# Patient Record
Sex: Male | Born: 1955 | Race: White | Hispanic: No | Marital: Married | Smoking: Never smoker
Health system: Southern US, Community
[De-identification: ages and names within clinical notes are randomized; demographics above are authoritative.]

## PROBLEM LIST (undated history)

## (undated) DIAGNOSIS — Z87828 Personal history of other (healed) physical injury and trauma: Secondary | ICD-10-CM

## (undated) DIAGNOSIS — E119 Type 2 diabetes mellitus without complications: Secondary | ICD-10-CM

## (undated) DIAGNOSIS — F329 Major depressive disorder, single episode, unspecified: Secondary | ICD-10-CM

## (undated) DIAGNOSIS — R519 Headache, unspecified: Secondary | ICD-10-CM

## (undated) DIAGNOSIS — K219 Gastro-esophageal reflux disease without esophagitis: Secondary | ICD-10-CM

## (undated) DIAGNOSIS — R351 Nocturia: Secondary | ICD-10-CM

## (undated) DIAGNOSIS — IMO0002 Reserved for concepts with insufficient information to code with codable children: Secondary | ICD-10-CM

## (undated) DIAGNOSIS — R51 Headache: Secondary | ICD-10-CM

## (undated) DIAGNOSIS — Z923 Personal history of irradiation: Secondary | ICD-10-CM

## (undated) DIAGNOSIS — C61 Malignant neoplasm of prostate: Secondary | ICD-10-CM

## (undated) DIAGNOSIS — Z973 Presence of spectacles and contact lenses: Secondary | ICD-10-CM

## (undated) DIAGNOSIS — F32A Depression, unspecified: Secondary | ICD-10-CM

## (undated) DIAGNOSIS — R350 Frequency of micturition: Secondary | ICD-10-CM

## (undated) HISTORY — PX: PROSTATE BIOPSY: SHX241

## (undated) HISTORY — PX: COLONOSCOPY: SHX174

---
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
## 2015-03-01 ENCOUNTER — Other Ambulatory Visit (HOSPITAL_COMMUNITY): Payer: Self-pay | Admitting: Urology

## 2015-03-01 DIAGNOSIS — C61 Malignant neoplasm of prostate: Secondary | ICD-10-CM

## 2015-03-15 ENCOUNTER — Encounter (HOSPITAL_COMMUNITY)
Admission: RE | Admit: 2015-03-15 | Discharge: 2015-03-15 | Disposition: A | Discharge status: Home / Self Care | Payer: Federal, State, Local not specified - PPO | Source: Ambulatory Visit | Attending: Urology | Admitting: Urology

## 2015-03-15 DIAGNOSIS — C61 Malignant neoplasm of prostate: Secondary | ICD-10-CM | POA: Diagnosis present

## 2015-03-15 IMAGING — NM NM BONE WHOLE BODY
2 series · 2 of 2 positions shown · non-contrast
Comparison: None.

CLINICAL DATA: Prostate carcinoma, evaluate for bone metastases

EXAM:
NUCLEAR MEDICINE WHOLE BODY BONE SCAN
TECHNIQUE: Whole body anterior and posterior images were obtained approximately
3 hours after intravenous injection of radiopharmaceutical.
RADIOPHARMACEUTICALS:  24.0 mCi [3J] MDP IV

[Series 1: whole body · 2.66mm/px · 1 of 1 slices shown (1 of 2)]
[im 1/1]
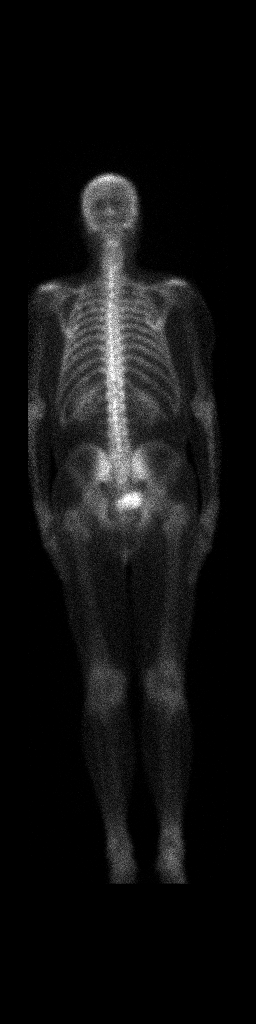

[Series 1: whole body · 2.66mm/px · 1 of 1 slices shown (2 of 2)]
[im 1/1]
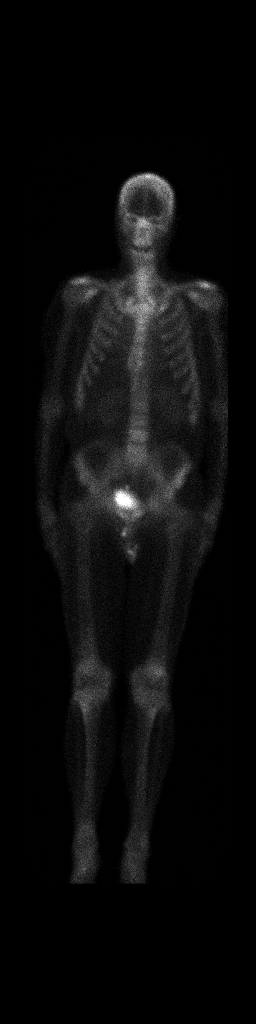

[2 of 2 positions shown; findings below may reference images not displayed]

FINDINGS: The patient was injected with 24.0 millicuries of technetium 99 M
MDP intravenously, and total body bone scan was performed. Activity
throughout the skeleton appears normal. No evidence of bone
metastasis is seen.
IMPRESSION: No evidence of bone metastasis.

## 2015-03-15 MED ORDER — TECHNETIUM TC 99M MEDRONATE IV KIT
24.0000 | PACK | Freq: Once | INTRAVENOUS | Status: AC | PRN
  Administered 2015-03-15: 24 via INTRAVENOUS

## 2015-03-22 ENCOUNTER — Encounter: Payer: Self-pay | Admitting: Radiation Oncology

## 2015-03-22 NOTE — Progress Notes (Signed)
GU Location of Tumor / Histology: prostatic adenocarcinoma  If Prostate Cancer, Gleason Score is (4 + 4) and PSA is (14.24)  Charles Marks has a history of an elevated PSA of 6.2 in December of 2015. Initial prostate biopsy proved BPH only.  Second prostate biopsies revealed:    Past/Anticipated interventions by urology, if any: biopsy, CT and bone scan clear, Lupron administered on 03/20/2015, referred to Dr. [name] to discuss seed and short course of external beam  Past/Anticipated interventions by medical oncology, if any: no  Weight changes, if any: no  Bowel/Bladder complaints, if any: nocturia x 1 maybe 2-3 nights out of 7. Describes a strong steady urine stream without difficulty emptying his bladder. Denies urinary hesitancy. Denies stress incontinence or leakage. Denies dysuria or hematuria.  Nausea/Vomiting, if any: no  Pain issues, if any:  no  SAFETY ISSUES:  Prior radiation? no  Pacemaker/ICD? no  Possible current pregnancy? no  Is the patient on methotrexate? no  Current Complaints / other details:  60 year old male. NKDA. 26 cc prostate volume.

## 2015-03-23 ENCOUNTER — Ambulatory Visit
Admission: RE | Admit: 2015-03-23 | Discharge: 2015-03-23 | Disposition: A | Discharge status: Home / Self Care | Payer: Federal, State, Local not specified - PPO | Source: Ambulatory Visit | Attending: Radiation Oncology | Admitting: Radiation Oncology

## 2015-03-23 ENCOUNTER — Encounter: Payer: Self-pay | Admitting: Radiation Oncology

## 2015-03-23 VITALS — BP 118/41 | HR 58 | Temp 98.0°F | Resp 16 | Ht 75.0 in | Wt 189.8 lb

## 2015-03-23 DIAGNOSIS — Z510 Encounter for antineoplastic radiation therapy: Secondary | ICD-10-CM | POA: Insufficient documentation

## 2015-03-23 DIAGNOSIS — F329 Major depressive disorder, single episode, unspecified: Secondary | ICD-10-CM | POA: Diagnosis not present

## 2015-03-23 DIAGNOSIS — E119 Type 2 diabetes mellitus without complications: Secondary | ICD-10-CM | POA: Insufficient documentation

## 2015-03-23 DIAGNOSIS — C61 Malignant neoplasm of prostate: Secondary | ICD-10-CM

## 2015-03-23 DIAGNOSIS — Z809 Family history of malignant neoplasm, unspecified: Secondary | ICD-10-CM | POA: Diagnosis not present

## 2015-03-23 DIAGNOSIS — K219 Gastro-esophageal reflux disease without esophagitis: Secondary | ICD-10-CM | POA: Insufficient documentation

## 2015-03-23 HISTORY — DX: Malignant neoplasm of prostate: C61

## 2015-03-23 HISTORY — DX: Gastro-esophageal reflux disease without esophagitis: K21.9

## 2015-03-23 HISTORY — DX: Depression, unspecified: F32.A

## 2015-03-23 HISTORY — DX: Major depressive disorder, single episode, unspecified: F32.9

## 2015-03-23 NOTE — Progress Notes (Signed)
Radiation Oncology         [phone]) [phone] ________________________________  Initial Outpatient Consultation  Name: Charles Marks MRN: [id number]  Date: 03/23/2015  DOB: 1955/02/04  [id number], Jeneen Rinks, MD  Kathie Rhodes, MD   REFERRING PHYSICIAN: Kathie Rhodes, MD  DIAGNOSIS: 60 y.o. gentleman with stage T1c adenocarcinoma of the prostate with a Gleason's score of 4+4 and a PSA of 14.24.    ICD-9-CM ICD-10-CM   1. Malignant neoplasm of prostate ([place]) [place] ILLNESS:Charles Marks is a 60 y.o. gentleman with a new diagnosis of adenocarcinoma of the prostate.  He was noted to have an elevated PSA of 6.2 in December 2015 while living in [place]. Initial prostate biopsy at that time did not reveal any malignancy.  He moved to [place] and was noted to have an elevated PSA of 14.24 by his primary care physician, Dr. Kary Kos.  Accordingly, he was referred for evaluation in urology by Dr. Karsten Ro on 02/09/15,  digital rectal examination was normal at that time.  The patient proceeded to transrectal ultrasound with 12 biopsies of the prostate on 02/21/2015.  The prostate volume measured 25.6 cc. 3/12 core biopsies, positive with a maximum Gleason score of 4+4 and this was seen in the left base. Gleason score 4+3 was seen in the left mid lateral and left apex lateral.  The patient reviewed the biopsy results with his urologist and he has kindly been referred today for discussion with Dr. [name]. He was counseled on the role of ADT and received Lupron on 03/20/15. He was already counseled on the role of radiotherapy in addition to ADT for his high risk prostate cancer, and is interested in how seed implant may be a part of his care.     PREVIOUS RADIATION THERAPY: No  PAST MEDICAL HISTORY:  Past Medical History  Diagnosis Date  . Prostate cancer ([place])   . Depression   . Diabetes mellitus without complication ([place])   . GERD (gastroesophageal reflux disease)     PAST  SURGICAL HISTORY: Past Surgical History  Procedure Laterality Date  . Appendectomy    . Prostate biopsy    . Prostate biopsy    . Vasectomy      FAMILY HISTORY: family history includes Cancer in his mother and sister; Diabetes in his sister; Multiple sclerosis in his mother. No history of prostate cancer.  SOCIAL HISTORY:  reports that he has never smoked. He has never used smokeless tobacco. He reports that he does not drink alcohol or use illicit drugs. 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 He is married and resides in [place]. He and his wife do not have any children. They are looking forward to receiving their new golden retriever puppy soon.  ALLERGIES: Review of patient's allergies indicates no known allergies.  MEDICATIONS:  Current Outpatient Prescriptions  Medication Sig Dispense Refill  . Cholecalciferol (VITAMIN D3) 1000 units CAPS Take by mouth.    . Cyanocobalamin (VITAMIN B-12) 5000 MCG SUBL Place under the tongue.    . ferrous sulfate 325 (65 FE) MG tablet Take by mouth.    . Multiple Vitamin (MULTI-VITAMINS) TABS Take by mouth.    Marland Kitchen omeprazole (PRILOSEC) 40 MG capsule Take by mouth.    . QUEtiapine Fumarate (SEROQUEL XR) 150 MG 24 hr tablet Take by mouth.    . sertraline (ZOLOFT) 100 MG tablet Take by mouth.    . SUMAtriptan 6 MG/0.5ML SOAJ Inject into the  skin.    . vitamin C (ASCORBIC ACID) 500 MG tablet Take by mouth.     No current facility-administered medications for this encounter.    REVIEW OF SYSTEMS: On review of systems, the patient reports that he is doing extremely well. He states that he is not experiencing any difficulty with chest pain, shortness of breath, fevers or chills. He is not experiencing any nausea or vomiting. He denies any abdominal pain, fullness, early satiety. He has not noticed any difficulty with bowel dysfunction. He has not had any unintended weight changes. The patient completed an IPSS and  IIEF questionnaire.  His IPSS score was 2 indicating mild urinary outflow obstructive symptoms.  He indicated that his erectile function is able to complete sexual activity on every attempt. A complete review of systems is obtained and is otherwise negative.   PHYSICAL EXAM:   height is 6\' 3"  (1.905 m) and weight is 189 lb 12.8 oz (86.093 kg). His oral temperature is 98 F (36.7 C). His blood pressure is 118/41 and his pulse is 58. His respiration is 16 and oxygen saturation is 100%.   Pain scale 0/10 In general this is a well appearing caucasian male in no acute distress. He is alert and oriented x4 and appropriate throughout the examination. HEENT reveals that the patient is normocephalic, atraumatic. EOMs are intact. PERRLA. Skin is intact without any evidence of gross lesions. Cardiovascular exam reveals a regular rate and rhythm, no clicks rubs or murmurs are auscultated. Chest is clear to auscultation bilaterally. Lymphatic assessment is performed and does not reveal any adenopathy in the cervical, supraclavicular, axillary, or inguinal chains. Abdomen has active bowel sounds in all quadrants and is intact. The abdomen is soft, non tender, non distended. Lower extremities are negative for pretibial pitting edema, deep calf tenderness, cyanosis or clubbing.   KPS = 100  100 - Normal; no complaints; no evidence of disease. 90   - Able to carry on normal activity; minor signs or symptoms of disease. 80   - Normal activity with effort; some signs or symptoms of disease. 92   - Cares for self; unable to carry on normal activity or to do active work. 60   - Requires occasional assistance, but is able to care for most of his personal needs. 50   - Requires considerable assistance and frequent medical care. 60   - Disabled; requires special care and assistance. 17   - Severely disabled; hospital admission is indicated although death not imminent. 42   - Very sick; hospital admission necessary; active  supportive treatment necessary. 10   - Moribund; fatal processes progressing rapidly. 0     - Dead  Karnofsky DA, Abelmann WH, Craver LS and Burchenal JH [phone]) The use of the nitrogen mustards in the palliative treatment of carcinoma: with particular reference to bronchogenic carcinoma Cancer 1 634-56   LABORATORY DATA:  No results found for: WBC, HGB, HCT, MCV, PLT No results found for: NA, K, CL, CO2 No results found for: ALT, AST, GGT, ALKPHOS, BILITOT   RADIOGRAPHY: Nm Bone Scan Whole Body  03/15/2015  CLINICAL DATA:  Prostate carcinoma, evaluate for bone metastases EXAM: NUCLEAR MEDICINE WHOLE BODY BONE SCAN TECHNIQUE: Whole body anterior and posterior images were obtained approximately 3 hours after intravenous injection of radiopharmaceutical. RADIOPHARMACEUTICALS:  24.0 mCi Technetium-43m MDP IV COMPARISON:  None. FINDINGS: The patient was injected with 0000000 millicuries of technetium 60 M MDP intravenously, and total body bone scan was performed. Activity throughout  the skeleton appears normal. No evidence of bone metastasis is seen. IMPRESSION: No evidence of bone metastasis. Electronically Signed   By: Ivar Drape M.D.   On: 03/15/2015 14:11      IMPRESSION: This gentleman is a 60 y.o. gentleman with stage T1c adenocarcinoma of the prostate with a Gleason's score of 4+4 and a PSA of 14.24.Marland Kitchen  His T-Stage, Gleason's Score, and PSA put him into the high risk group.  Accordingly he is eligible for a variety of potential treatment options including prostatectomy vs. radiation therapy with hormone therapy +/- seed implant boost.  PLAN: Dr. [name] reviewed the findings and workup thus far.  We discussed the natural history of prostate cancer.  We reviewed the the implications of T-stage, Gleason's Score, and PSA on decision-making and outcomes in prostate cancer.  We discussed radiation treatment in the management of prostate cancer with regard to the logistics and delivery of external beam  radiation treatment as well as the logistics and delivery of prostate brachytherapy.  We compared and contrasted each of these approaches and also compared these against prostatectomy.  The patient expressed interest in external beam radiotherapy plus seed implant. Dr. [name] filled out a patient counseling form for him with relevant treatment diagrams and we retained a copy for our records.   The patient would like to proceed with two years of hormone therapy plus external radiation with seed implant.  We will share these findings with Dr. Karsten Ro and move forward with scheduling placement of three gold fiducial markers into the prostate to proceed with external radiation in the near future.     We enjoyed meeting with him today, and will look forward to participating in the care of this very nice gentleman.  The above documentation reflects my direct findings during this shared patient visit. Please see the separate note by Dr. [name] on this date for the remainder of the patient's plan of care.  Carola Rhine, PAC   This document serves as a record of services personally performed by [name], PAC and [name], MD. It was created on their behalf by Darcus Austin, a trained medical scribe. The creation of this record is based on the scribe's personal observations and the provider's statements to them. This document has been checked and approved by the attending provider.

## 2015-03-23 NOTE — Progress Notes (Signed)
See progress note under physician encounter. 

## 2015-04-06 ENCOUNTER — Telehealth: Payer: Self-pay | Admitting: *Deleted

## 2015-04-06 NOTE — Telephone Encounter (Signed)
CALLED PATIENT TO INFORM OF SIM APPT. ON 05-12-15 @ 10 AM, PATIENT TO HAVE GOLD SEEDS PLACED ON 04-11-15 @ DR. OTTELIN'S , SPOKE WITH PATIENT'S WIFE - JANET AND SHE IS AWARE OF THESE APPTS.

## 2015-05-01 ENCOUNTER — Other Ambulatory Visit: Payer: Self-pay | Admitting: Radiation Oncology

## 2015-05-01 DIAGNOSIS — C61 Malignant neoplasm of prostate: Secondary | ICD-10-CM

## 2015-05-01 MED ORDER — DIAZEPAM 5 MG PO TABS: 5.0000 mg | ORAL_TABLET | ORAL | Status: DC

## 2015-05-02 ENCOUNTER — Telehealth: Payer: Self-pay | Admitting: Radiation Oncology

## 2015-05-02 NOTE — Telephone Encounter (Signed)
Phoned patient to inquire about pharmacy of choice. Learned [facility] in [place], [place]. Enter this pharmacy into his preference list. Per Dr. Johny Shears order called in Valium to [facility] in [place]. Patient understands he can pick up this script in approximately one hour. Patient expresses appreciation for the call.

## 2015-05-12 ENCOUNTER — Ambulatory Visit
Admission: RE | Admit: 2015-05-12 | Discharge: 2015-05-12 | Disposition: A | Discharge status: Home / Self Care | Payer: Federal, State, Local not specified - PPO | Source: Ambulatory Visit | Attending: Radiation Oncology | Admitting: Radiation Oncology

## 2015-05-12 ENCOUNTER — Other Ambulatory Visit: Payer: Self-pay | Admitting: Radiation Oncology

## 2015-05-12 DIAGNOSIS — Z510 Encounter for antineoplastic radiation therapy: Secondary | ICD-10-CM | POA: Diagnosis not present

## 2015-05-12 DIAGNOSIS — C61 Malignant neoplasm of prostate: Secondary | ICD-10-CM

## 2015-05-12 NOTE — Progress Notes (Signed)
  Radiation Oncology         [phone]) [phone] ________________________________  Name: Charles Marks MRN: [id number]  Date: 05/12/2015  DOB: 1955-05-20  SIMULATION AND TREATMENT PLANNING NOTE    ICD-9-CM ICD-10-CM   1. Malignant neoplasm of prostate ([place]) 185 C61     DIAGNOSIS:  60 y.o. gentleman with stage T1c adenocarcinoma of the prostate with a Gleason's score of 4+4 and a PSA of 14.24.  NARRATIVE:  The patient was brought to the [facility].  Identity was confirmed.  All relevant records and images related to the planned course of therapy were reviewed.  The patient freely provided informed written consent to proceed with treatment after reviewing the details related to the planned course of therapy. The consent form was witnessed and verified by the simulation staff.  Then, the patient was set-up in a stable reproducible supine position for radiation therapy.  A vacuum lock pillow device was custom fabricated to position his legs in a reproducible immobilized position.  Then, I performed a urethrogram under sterile conditions to identify the prostatic apex.  CT images were obtained.  Surface markings were placed.  The CT images were loaded into the planning software.  Then the prostate target and avoidance structures including the rectum, bladder, bowel and hips were contoured.  Treatment planning then occurred.  The radiation prescription was entered and confirmed.  A total of 5 complex treatment devices were fabricated including one BodyFix immobilizer and 4 MLC collimators to shield critical bladder, bowel, and hips. I have requested : 3D Simulation  I have requested a DVH of the following structures: Prostate, Rectum, Left Hip, Right Hip, Bowel.  COMPLEX SIMULATION:  The patient presented today also for evaluation for possible prostate seed implant boost. On his planning CT on each axial slice, I contoured the prostate gland. Then, using three-dimensional radiation planning  tools I reconstructed the prostate in view of the structures from the transperineal needle pathway to assess for possible pubic arch interference. In doing so, I did not appreciate any pubic arch interference. Also, the patient's prostate volume was estimated based on the drawn structure. The volume was 23 cc.  Given the pubic arch appearance and prostate volume, patient remains a good candidate to proceed with prostate seed implant as a boost    PLAN:  The patient will receive 45 Gy in 25 fractions followed by seed boost  ________________________________  [name]. [name], M.D.  This document serves as a record of services personally performed by [name], MD. It was created on his behalf by Arlyce Harman, a trained medical scribe. The creation of this record is based on the scribe's personal observations and the provider's statements to them. This document has been checked and approved by the attending provider.

## 2015-05-18 DIAGNOSIS — Z510 Encounter for antineoplastic radiation therapy: Secondary | ICD-10-CM | POA: Diagnosis not present

## 2015-05-23 ENCOUNTER — Ambulatory Visit
Admission: RE | Admit: 2015-05-23 | Discharge: 2015-05-23 | Disposition: A | Discharge status: Home / Self Care | Payer: Federal, State, Local not specified - PPO | Source: Ambulatory Visit | Attending: Radiation Oncology | Admitting: Radiation Oncology

## 2015-05-23 DIAGNOSIS — Z510 Encounter for antineoplastic radiation therapy: Secondary | ICD-10-CM | POA: Diagnosis not present

## 2015-05-24 ENCOUNTER — Encounter: Payer: Self-pay | Admitting: Radiation Oncology

## 2015-05-24 ENCOUNTER — Ambulatory Visit
Admission: RE | Admit: 2015-05-24 | Discharge: 2015-05-24 | Disposition: A | Discharge status: Home / Self Care | Payer: Federal, State, Local not specified - PPO | Source: Ambulatory Visit | Attending: Radiation Oncology | Admitting: Radiation Oncology

## 2015-05-24 DIAGNOSIS — Z510 Encounter for antineoplastic radiation therapy: Secondary | ICD-10-CM | POA: Diagnosis not present

## 2015-05-24 NOTE — Progress Notes (Signed)
Received voicemail from [employer](Darlene)@ [phone] for patient asking about copays being collected and no one contacted them to obtain benefit information. She left contact name and number for patient's spouse to return call to or to contact them at [facility]. Reviewed chart and saw this is a Radiation patient. Called patient's wife to advise her that Radiation bills separate and she can contact Meredith([phone])whom is a financial advocate for Radiation patients and can discuss how their copays, insurance, and billing is handled. Patient thanked me for returning her call.

## 2015-05-25 ENCOUNTER — Ambulatory Visit
Admission: RE | Admit: 2015-05-25 | Payer: Federal, State, Local not specified - PPO | Source: Ambulatory Visit | Admitting: Radiation Oncology

## 2015-05-25 ENCOUNTER — Ambulatory Visit
Admission: RE | Admit: 2015-05-25 | Discharge: 2015-05-25 | Disposition: A | Discharge status: Home / Self Care | Payer: Federal, State, Local not specified - PPO | Source: Ambulatory Visit | Attending: Radiation Oncology | Admitting: Radiation Oncology

## 2015-05-25 DIAGNOSIS — Z510 Encounter for antineoplastic radiation therapy: Secondary | ICD-10-CM | POA: Diagnosis not present

## 2015-05-26 ENCOUNTER — Ambulatory Visit
Admission: RE | Admit: 2015-05-26 | Discharge: 2015-05-26 | Disposition: A | Discharge status: Home / Self Care | Payer: Federal, State, Local not specified - PPO | Source: Ambulatory Visit | Attending: Radiation Oncology | Admitting: Radiation Oncology

## 2015-05-26 ENCOUNTER — Encounter: Payer: Self-pay | Admitting: Radiation Oncology

## 2015-05-26 VITALS — BP 116/57 | HR 59 | Temp 97.7°F | Resp 12 | Wt 188.6 lb

## 2015-05-26 DIAGNOSIS — Z510 Encounter for antineoplastic radiation therapy: Secondary | ICD-10-CM | POA: Diagnosis not present

## 2015-05-26 DIAGNOSIS — C61 Malignant neoplasm of prostate: Secondary | ICD-10-CM

## 2015-05-26 MED ORDER — RADIAPLEXRX EX GEL
Freq: Once | CUTANEOUS | Status: AC
  Administered 2015-05-26: 12:00:00 via TOPICAL

## 2015-05-26 NOTE — Progress Notes (Signed)
PAIN: He is currently in no pain. URINARY: Pt denies abnormal urinary symptoms, clear yellow urine. Pt states they urinate 1 - 2 times per night.  BOWEL: Pt reports "feeling off with his stomach." a soft bowel movement everyday/everyother day. OTHER: Pt complains of fatigue and weakness.   BP 116/57 mmHg  Pulse 59  Temp(Src) 97.7 F (36.5 C) (Oral)  Resp 12  Wt 188 lb 9.6 oz (85.548 kg)  SpO2 100% Wt Readings from Last 3 Encounters:  05/26/15 188 lb 9.6 oz (85.548 kg)  03/23/15 189 lb 12.8 oz (86.093 kg)

## 2015-05-29 ENCOUNTER — Ambulatory Visit
Admission: RE | Admit: 2015-05-29 | Discharge: 2015-05-29 | Disposition: A | Discharge status: Home / Self Care | Payer: Federal, State, Local not specified - PPO | Source: Ambulatory Visit | Attending: Radiation Oncology | Admitting: Radiation Oncology

## 2015-05-29 DIAGNOSIS — Z510 Encounter for antineoplastic radiation therapy: Secondary | ICD-10-CM | POA: Diagnosis not present

## 2015-05-29 NOTE — Addendum Note (Signed)
Encounter addended by: Kyung Rudd, MD on: 05/29/2015  6:21 AM<BR>     Documentation filed: Notes Section, Problem List

## 2015-05-29 NOTE — Progress Notes (Signed)
   Department of Radiation Oncology  Phone:  [phone] Fax:        [phone]  Weekly Treatment Note    Name: Charles Marks Date: 05/29/2015 MRN: [id number] DOB: October 07, 1955   Diagnosis:     ICD-9-CM ICD-10-CM   1. Malignant neoplasm of prostate ([facility]) 185 C61 hyaluronate sodium (RADIAPLEXRX) gel     Current dose: 7.2 Gy  Current fraction: 4   MEDICATIONS: Current Outpatient Prescriptions  Medication Sig Dispense Refill  . Cholecalciferol (VITAMIN D3) 1000 units CAPS Take by mouth.    . Cyanocobalamin (VITAMIN B-12) 5000 MCG SUBL Place under the tongue.    . diazepam (VALIUM) 5 MG tablet Take 1 tablet (5 mg total) by mouth as directed. 30 min before radiation appointment 10 tablet 0  . ferrous sulfate 325 (65 FE) MG tablet Take by mouth.    . Multiple Vitamin (MULTI-VITAMINS) TABS Take by mouth.    Marland Kitchen omeprazole (PRILOSEC) 40 MG capsule Take by mouth.    . QUEtiapine Fumarate (SEROQUEL XR) 150 MG 24 hr tablet Take by mouth.    . sertraline (ZOLOFT) 100 MG tablet Take by mouth.    . SUMAtriptan 6 MG/0.5ML SOAJ Inject into the skin.    Marland Kitchen vitamin C (ASCORBIC ACID) 500 MG tablet Take by mouth.     No current facility-administered medications for this encounter.     ALLERGIES: Review of patient's allergies indicates no known allergies.   LABORATORY DATA:  No results found for: WBC, HGB, HCT, MCV, PLT No results found for: NA, K, CL, CO2 No results found for: ALT, AST, GGT, ALKPHOS, BILITOT   NARRATIVE: Charles Marks was seen today for weekly treatment management. The chart was checked and the patient's films were reviewed.  PAIN: He is currently in no pain. URINARY: Pt denies abnormal urinary symptoms, clear yellow urine. Pt states they urinate 1 - 2 times per night.  BOWEL: Pt reports "feeling off with his stomach." a soft bowel movement everyday/everyother day. OTHER: Pt complains of fatigue and weakness.   BP 116/57 mmHg  Pulse 59  Temp(Src) 97.7 F (36.5  C) (Oral)  Resp 12  Wt 188 lb 9.6 oz (85.548 kg)  SpO2 100% Wt Readings from Last 3 Encounters:  05/26/15 188 lb 9.6 oz (85.548 kg)  03/23/15 189 lb 12.8 oz (86.093 kg)    PHYSICAL EXAMINATION: weight is 188 lb 9.6 oz (85.548 kg). His oral temperature is 97.7 F (36.5 C). His blood pressure is 116/57 and his pulse is 59. His respiration is 12 and oxygen saturation is 100%.        ASSESSMENT: The patient is doing satisfactorily with treatment.  PLAN: We will continue with the patient's radiation treatment as planned.

## 2015-05-30 ENCOUNTER — Ambulatory Visit
Admission: RE | Admit: 2015-05-30 | Discharge: 2015-05-30 | Disposition: A | Discharge status: Home / Self Care | Payer: Federal, State, Local not specified - PPO | Source: Ambulatory Visit | Attending: Radiation Oncology | Admitting: Radiation Oncology

## 2015-05-30 ENCOUNTER — Other Ambulatory Visit: Payer: Self-pay | Admitting: Urology

## 2015-05-30 DIAGNOSIS — Z510 Encounter for antineoplastic radiation therapy: Secondary | ICD-10-CM | POA: Diagnosis not present

## 2015-05-31 ENCOUNTER — Ambulatory Visit
Admission: RE | Admit: 2015-05-31 | Discharge: 2015-05-31 | Disposition: A | Discharge status: Home / Self Care | Payer: Federal, State, Local not specified - PPO | Source: Ambulatory Visit | Attending: Radiation Oncology | Admitting: Radiation Oncology

## 2015-05-31 DIAGNOSIS — Z510 Encounter for antineoplastic radiation therapy: Secondary | ICD-10-CM | POA: Diagnosis not present

## 2015-06-01 ENCOUNTER — Encounter: Payer: Self-pay | Admitting: Radiation Oncology

## 2015-06-01 ENCOUNTER — Ambulatory Visit
Admission: RE | Admit: 2015-06-01 | Discharge: 2015-06-01 | Disposition: A | Discharge status: Home / Self Care | Payer: Federal, State, Local not specified - PPO | Source: Ambulatory Visit | Attending: Radiation Oncology | Admitting: Radiation Oncology

## 2015-06-01 ENCOUNTER — Other Ambulatory Visit: Payer: Self-pay

## 2015-06-01 ENCOUNTER — Ambulatory Visit (HOSPITAL_COMMUNITY)
Admission: RE | Admit: 2015-06-01 | Discharge: 2015-06-01 | Disposition: A | Discharge status: Home / Self Care | Payer: Federal, State, Local not specified - PPO | Source: Ambulatory Visit | Attending: Radiation Oncology | Admitting: Radiation Oncology

## 2015-06-01 ENCOUNTER — Ambulatory Visit (HOSPITAL_COMMUNITY): Payer: Federal, State, Local not specified - PPO

## 2015-06-01 ENCOUNTER — Ambulatory Visit (HOSPITAL_COMMUNITY)
Admission: RE | Admit: 2015-06-01 | Discharge: 2015-06-01 | Disposition: A | Discharge status: Home / Self Care | Payer: Federal, State, Local not specified - PPO | Source: Ambulatory Visit | Attending: Urology | Admitting: Urology

## 2015-06-01 ENCOUNTER — Other Ambulatory Visit: Payer: Self-pay | Admitting: Urology

## 2015-06-01 ENCOUNTER — Ambulatory Visit (HOSPITAL_COMMUNITY): Admission: RE | Admit: 2015-06-01 | Payer: Federal, State, Local not specified - PPO | Source: Ambulatory Visit

## 2015-06-01 VITALS — BP 127/53 | HR 66 | Resp 16 | Wt 190.5 lb

## 2015-06-01 DIAGNOSIS — Z01818 Encounter for other preprocedural examination: Secondary | ICD-10-CM | POA: Diagnosis present

## 2015-06-01 DIAGNOSIS — C61 Malignant neoplasm of prostate: Secondary | ICD-10-CM

## 2015-06-01 DIAGNOSIS — Z510 Encounter for antineoplastic radiation therapy: Secondary | ICD-10-CM | POA: Diagnosis not present

## 2015-06-01 IMAGING — CR DG CHEST 2V
2 series · 2 of 2 positions shown · non-contrast
Comparison: None.

CLINICAL DATA: Preop for brachytherapy seed implant

EXAM:
CHEST  2 VIEW

[w chest pa]
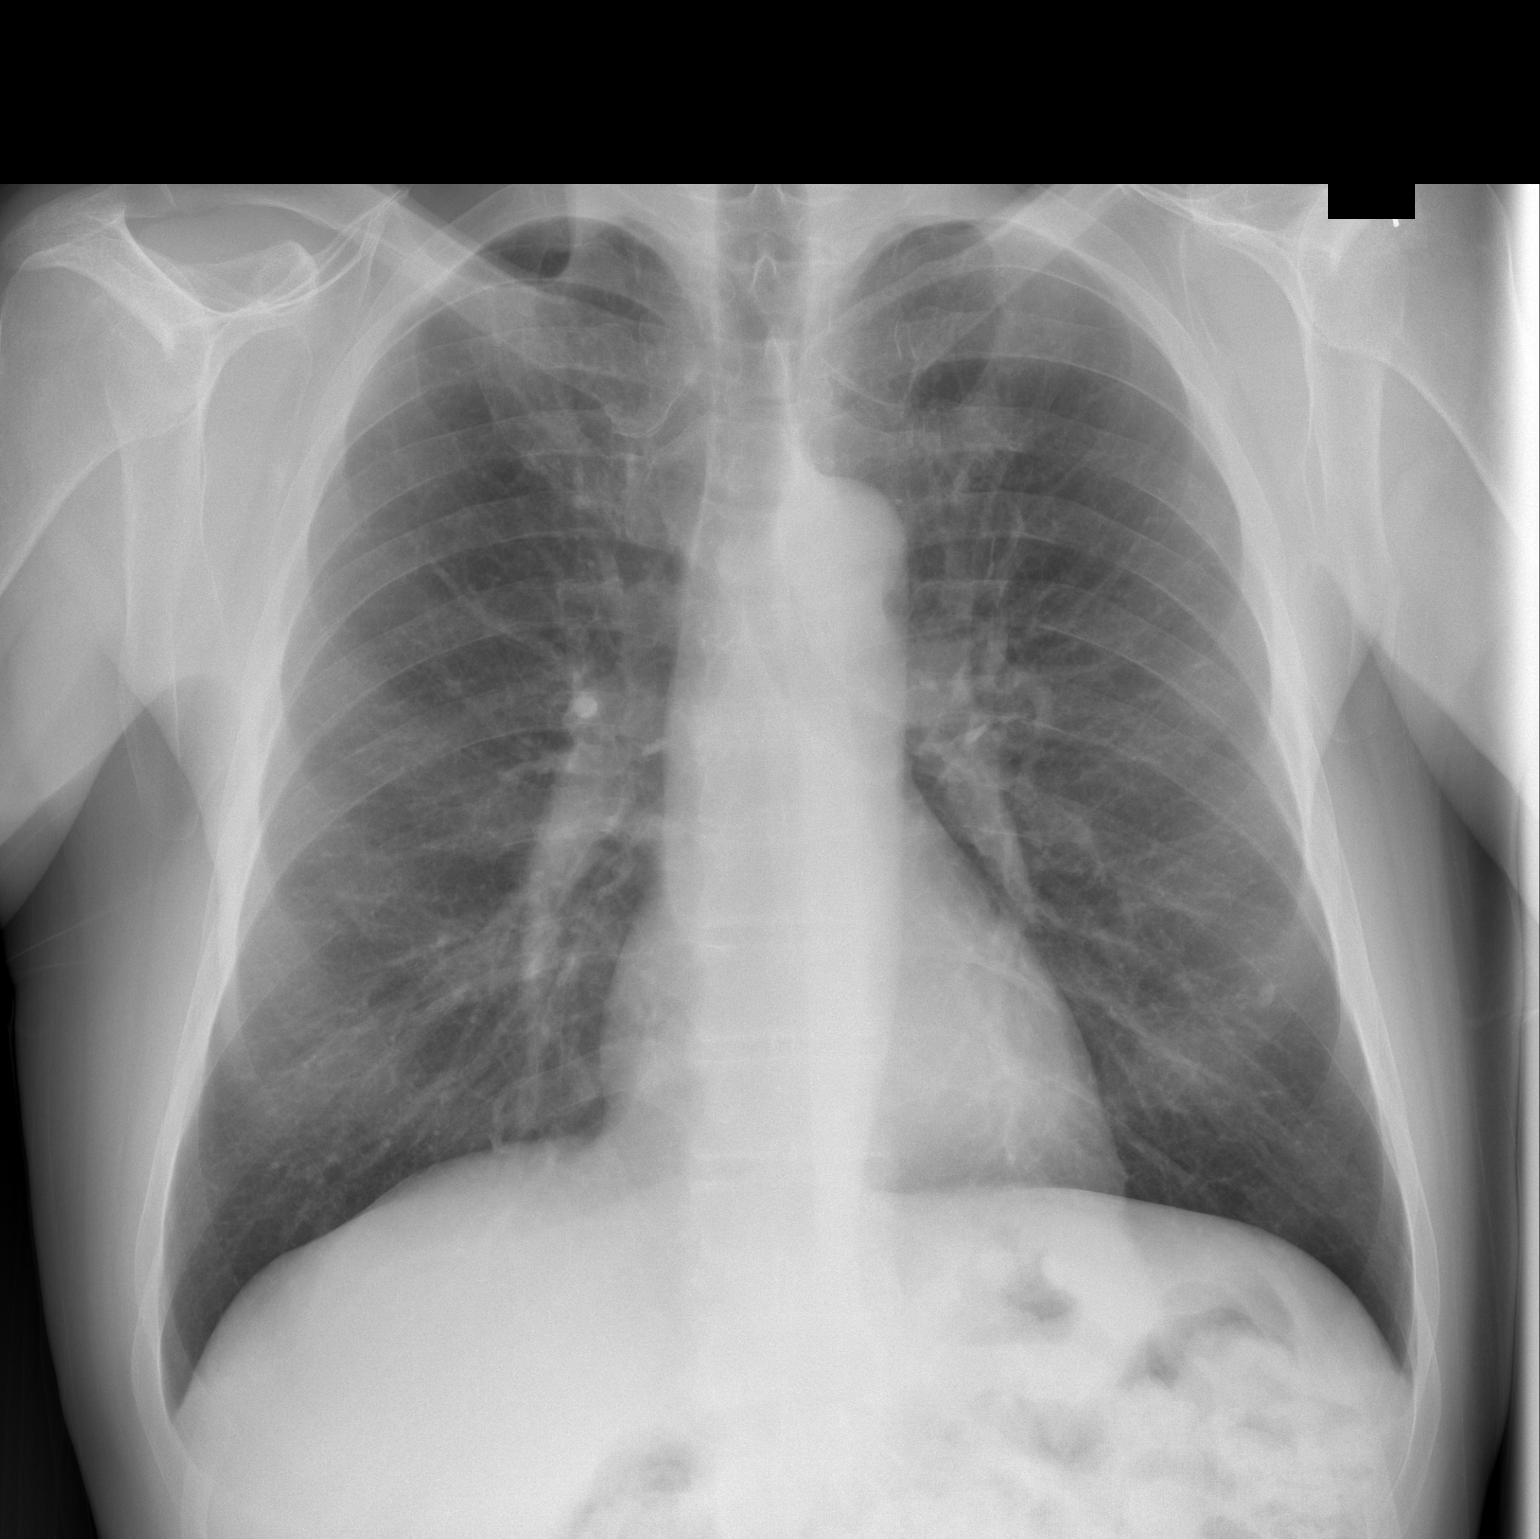

[w chest lat]
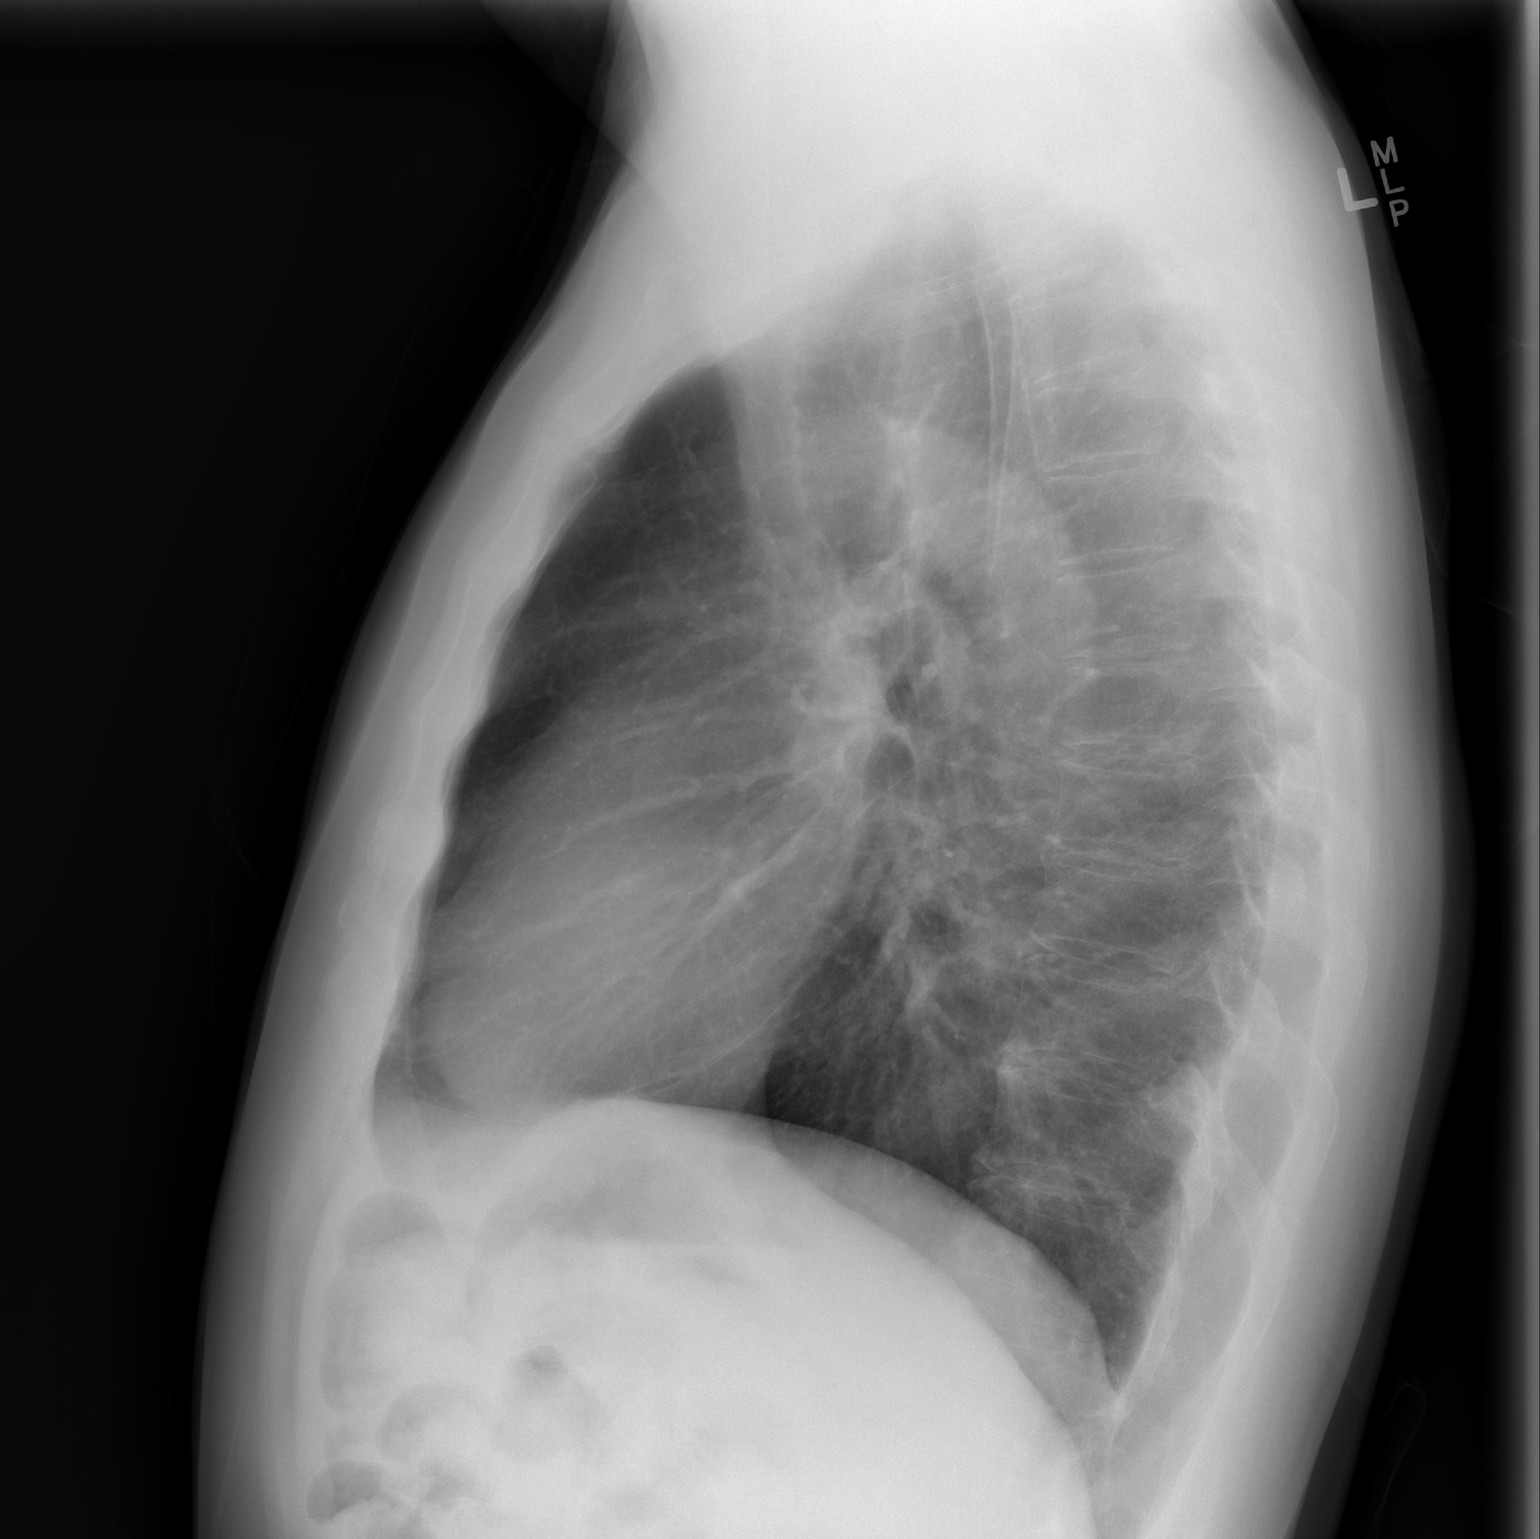

[2 of 2 positions shown; findings below may reference images not displayed]

FINDINGS: The heart size and mediastinal contours are within normal limits.
Both lungs are clear. The visualized skeletal structures are
unremarkable.
IMPRESSION: No active cardiopulmonary disease.

## 2015-06-01 NOTE — Progress Notes (Signed)
Weight and vitals stable. Denies pain. Reports nocturia x 2. Denies dysuria or hematuria. Denies difficulty emptying . Describes a strong steady urine stream. Denies incontinence or leakage. Describes soft stool. Denies rectal irritation. Reports mild fatigue.   BP 127/53 mmHg  Pulse 66  Resp 16  Wt 190 lb 8 oz (86.41 kg)  SpO2 100% Wt Readings from Last 3 Encounters:  06/01/15 190 lb 8 oz (86.41 kg)  05/26/15 188 lb 9.6 oz (85.548 kg)  03/23/15 189 lb 12.8 oz (86.093 kg)

## 2015-06-01 NOTE — Progress Notes (Signed)
  Radiation Oncology         [phone]   Name: Charles Marks MRN: [id number]   Date: 06/01/2015  DOB: May 03, 1955   Weekly Radiation Therapy Management    ICD-9-CM ICD-10-CM   1. Malignant neoplasm of prostate ([place]) 185 C61     Current Dose: 14.4 Gy  Planned Dose:  45 Gy + Boost  Narrative The patient presents for routine under treatment assessment.  Weight and vitals stable. Denies pain. Reports nocturia x 2. Denies any dysuria, hematuria, incontinence, leakage, and difficulty emptying. He also denies any rectal irritation. He describes a strong steady urine stream as well as soft stool. Reports mild fatigue.   Set-up films were reviewed. The chart was checked.  Physical Findings  weight is 190 lb 8 oz (86.41 kg). His blood pressure is 127/53 and his pulse is 66. His respiration is 16 and oxygen saturation is 100%.  Weight essentially stable. This is a pleasant male in no acute distress. He is alert and oriented.   Impression The patient is tolerating radiation.  Plan Continue treatment as planned.    [name] [name], M.D.  This document serves as a record of services personally performed by [name], PA and [name], MD. It was created on their behalf by [name], a trained medical scribe. The creation of this record is based on the scribe's personal observations and the provider's statements to them. This document has been checked and approved by the attending provider.

## 2015-06-02 ENCOUNTER — Ambulatory Visit
Admission: RE | Admit: 2015-06-02 | Discharge: 2015-06-02 | Disposition: A | Discharge status: Home / Self Care | Payer: Federal, State, Local not specified - PPO | Source: Ambulatory Visit | Attending: Radiation Oncology | Admitting: Radiation Oncology

## 2015-06-02 DIAGNOSIS — Z510 Encounter for antineoplastic radiation therapy: Secondary | ICD-10-CM | POA: Diagnosis not present

## 2015-06-05 ENCOUNTER — Ambulatory Visit
Admission: RE | Admit: 2015-06-05 | Discharge: 2015-06-05 | Disposition: A | Discharge status: Home / Self Care | Payer: Federal, State, Local not specified - PPO | Source: Ambulatory Visit | Attending: Radiation Oncology | Admitting: Radiation Oncology

## 2015-06-05 DIAGNOSIS — Z510 Encounter for antineoplastic radiation therapy: Secondary | ICD-10-CM | POA: Diagnosis not present

## 2015-06-06 ENCOUNTER — Ambulatory Visit
Admission: RE | Admit: 2015-06-06 | Discharge: 2015-06-06 | Disposition: A | Discharge status: Home / Self Care | Payer: Federal, State, Local not specified - PPO | Source: Ambulatory Visit | Attending: Radiation Oncology | Admitting: Radiation Oncology

## 2015-06-06 DIAGNOSIS — Z510 Encounter for antineoplastic radiation therapy: Secondary | ICD-10-CM | POA: Diagnosis not present

## 2015-06-07 ENCOUNTER — Ambulatory Visit
Admission: RE | Admit: 2015-06-07 | Discharge: 2015-06-07 | Disposition: A | Discharge status: Home / Self Care | Payer: Federal, State, Local not specified - PPO | Source: Ambulatory Visit | Attending: Radiation Oncology | Admitting: Radiation Oncology

## 2015-06-07 DIAGNOSIS — Z510 Encounter for antineoplastic radiation therapy: Secondary | ICD-10-CM | POA: Diagnosis not present

## 2015-06-08 ENCOUNTER — Ambulatory Visit
Admission: RE | Admit: 2015-06-08 | Discharge: 2015-06-08 | Disposition: A | Discharge status: Home / Self Care | Payer: Federal, State, Local not specified - PPO | Source: Ambulatory Visit | Attending: Radiation Oncology | Admitting: Radiation Oncology

## 2015-06-08 DIAGNOSIS — Z510 Encounter for antineoplastic radiation therapy: Secondary | ICD-10-CM | POA: Diagnosis not present

## 2015-06-09 ENCOUNTER — Ambulatory Visit
Admission: RE | Admit: 2015-06-09 | Discharge: 2015-06-09 | Disposition: A | Discharge status: Home / Self Care | Payer: Federal, State, Local not specified - PPO | Source: Ambulatory Visit | Attending: Radiation Oncology | Admitting: Radiation Oncology

## 2015-06-09 ENCOUNTER — Encounter: Payer: Self-pay | Admitting: Radiation Oncology

## 2015-06-09 VITALS — BP 115/51 | HR 58 | Resp 16 | Wt 188.0 lb

## 2015-06-09 DIAGNOSIS — C61 Malignant neoplasm of prostate: Secondary | ICD-10-CM

## 2015-06-09 DIAGNOSIS — Z510 Encounter for antineoplastic radiation therapy: Secondary | ICD-10-CM | POA: Diagnosis not present

## 2015-06-09 NOTE — Progress Notes (Signed)
Weight and vitals stable. Denies pain. Reports nocturia x 2. Denies dysuria or hematuria. Denies difficulty emptying his bladder. Describes a strong steady urine stream. Denies incontinence or leakage. Reports managing diarrhea with OTC Imodium. Denies any rectal irritation. Denies nausea or vomiting. Reports mild fatigue.   BP 115/51 mmHg  Pulse 58  Resp 16  Wt 188 lb (85.276 kg)  SpO2 100% Wt Readings from Last 3 Encounters:  06/09/15 188 lb (85.276 kg)  06/01/15 190 lb 8 oz (86.41 kg)  05/26/15 188 lb 9.6 oz (85.548 kg)

## 2015-06-09 NOTE — Progress Notes (Signed)
  Radiation Oncology         [phone]   Name: Charles Marks MRN: [id number]   Date: 06/09/2015  DOB: 1955-12-05   Weekly Radiation Therapy Management    ICD-9-CM ICD-10-CM   1. Malignant neoplasm of prostate ([place]) 185 C61     Current Dose: 25.2 Gy  Planned Dose:  45 Gy + Boost  Narrative The patient presents for routine under treatment assessment.  Weight and vitals stable. Denies pain. Reports nocturia x 2. Denies dysuria or hematuria. Denies difficulty emptying his bladder. Describes a strong steady urine stream. Denies incontinence or leakage. Reports managing diarrhea with OTC Imodium. Denies any rectal irritation. Denies nausea or vomiting. Reports mild fatigue. Stays active.    Set-up films were reviewed. The chart was checked.  Physical Findings  weight is 188 lb (85.276 kg). His blood pressure is 115/51 and his pulse is 58. His respiration is 16 and oxygen saturation is 100%.  Weight essentially stable. This is a pleasant male in no acute distress. He is alert and oriented.   Impression The patient is tolerating radiation.  Plan Continue treatment as planned.    [name] [name], M.D.  This document serves as a record of services personally performed by [name], MD. It was created on his behalf by Derek Mound, a trained medical scribe. The creation of this record is based on the scribe's personal observations and the provider's statements to them. This document has been checked and approved by the attending provider.

## 2015-06-12 ENCOUNTER — Ambulatory Visit
Admission: RE | Admit: 2015-06-12 | Discharge: 2015-06-12 | Disposition: A | Discharge status: Home / Self Care | Payer: Federal, State, Local not specified - PPO | Source: Ambulatory Visit | Attending: Radiation Oncology | Admitting: Radiation Oncology

## 2015-06-12 DIAGNOSIS — Z510 Encounter for antineoplastic radiation therapy: Secondary | ICD-10-CM | POA: Diagnosis not present

## 2015-06-13 ENCOUNTER — Ambulatory Visit
Admission: RE | Admit: 2015-06-13 | Discharge: 2015-06-13 | Disposition: A | Discharge status: Home / Self Care | Payer: Federal, State, Local not specified - PPO | Source: Ambulatory Visit | Attending: Radiation Oncology | Admitting: Radiation Oncology

## 2015-06-13 DIAGNOSIS — Z510 Encounter for antineoplastic radiation therapy: Secondary | ICD-10-CM | POA: Diagnosis not present

## 2015-06-14 ENCOUNTER — Ambulatory Visit
Admission: RE | Admit: 2015-06-14 | Discharge: 2015-06-14 | Disposition: A | Discharge status: Home / Self Care | Payer: Federal, State, Local not specified - PPO | Source: Ambulatory Visit | Attending: Radiation Oncology | Admitting: Radiation Oncology

## 2015-06-14 DIAGNOSIS — Z510 Encounter for antineoplastic radiation therapy: Secondary | ICD-10-CM | POA: Diagnosis not present

## 2015-06-15 ENCOUNTER — Ambulatory Visit
Admission: RE | Admit: 2015-06-15 | Discharge: 2015-06-15 | Disposition: A | Discharge status: Home / Self Care | Payer: Federal, State, Local not specified - PPO | Source: Ambulatory Visit | Attending: Radiation Oncology | Admitting: Radiation Oncology

## 2015-06-15 DIAGNOSIS — Z510 Encounter for antineoplastic radiation therapy: Secondary | ICD-10-CM | POA: Diagnosis not present

## 2015-06-16 ENCOUNTER — Ambulatory Visit
Admission: RE | Admit: 2015-06-16 | Discharge: 2015-06-16 | Disposition: A | Discharge status: Home / Self Care | Payer: Federal, State, Local not specified - PPO | Source: Ambulatory Visit | Attending: Radiation Oncology | Admitting: Radiation Oncology

## 2015-06-16 ENCOUNTER — Encounter: Payer: Self-pay | Admitting: Radiation Oncology

## 2015-06-16 VITALS — BP 124/50 | HR 59 | Resp 16 | Wt 187.1 lb

## 2015-06-16 DIAGNOSIS — C61 Malignant neoplasm of prostate: Secondary | ICD-10-CM

## 2015-06-16 DIAGNOSIS — Z510 Encounter for antineoplastic radiation therapy: Secondary | ICD-10-CM | POA: Diagnosis not present

## 2015-06-16 NOTE — Progress Notes (Signed)
  Radiation Oncology         [phone]   Name: Charles Marks MRN: [id number]   Date: 06/16/2015  DOB: 1955-09-15   Weekly Radiation Therapy Management    ICD-9-CM ICD-10-CM   1. Malignant neoplasm of prostate ([place]) 185 C61     Current Dose: 34.2 Gy  Planned Dose:  45 Gy + Boost  Narrative The patient presents for routine under treatment assessment.  Weight and vitals stable. Denies pain. Reports he had a severe migraine last night which he manage with Imitrex. Denies dysuria or hematuria. Reports nocturia x2. Denies difficulty emptying his bladder. Describes a strong steady urine stream. Denies incontinence or leakage. Reports managing diarrhea with Imodium. Denies rectal irritation. Reports fatigue. He mentions that he is able to get an erection but is not able to climax. This might be due to androgen deprivation or an SSRI he is taking.  Set-up films were reviewed. The chart was checked.  Physical Findings  weight is 187 lb 1.6 oz (84.868 kg). His blood pressure is 124/50 and his pulse is 59. His respiration is 16 and oxygen saturation is 100%.  Weight essentially stable. This is a pleasant male in no acute distress. He is alert and oriented.   Impression The patient is tolerating radiation.  Plan Continue treatment as planned. His seed implant is scheduled at the end of June.    [name] [name], M.D.    This document serves as a record of services personally performed by Charles Pita, MD. It was created on his behalf by Lendon Collar, a trained medical scribe. The creation of this record is based on the scribe's personal observations and the provider's statements to them. This document has been checked and approved by the attending provider.

## 2015-06-16 NOTE — Progress Notes (Addendum)
Weight and vitals stable. Denies pain. Reports he had a severe migraine last night which he manage with Imitrex. Denies dysuria or hematuria. Reports nocturia x2. Denies difficulty emptying his bladder. Describes a strong steady urine stream. Denies incontinence or leakage. Reports managing diarrhea with Imodium. Denies rectal irritation. Reports fatigue.   BP 124/50 mmHg  Pulse 59  Resp 16  Wt 187 lb 1.6 oz (84.868 kg)  SpO2 100% Wt Readings from Last 3 Encounters:  06/16/15 187 lb 1.6 oz (84.868 kg)  06/09/15 188 lb (85.276 kg)  06/01/15 190 lb 8 oz (86.41 kg)

## 2015-06-20 ENCOUNTER — Ambulatory Visit
Admission: RE | Admit: 2015-06-20 | Discharge: 2015-06-20 | Disposition: A | Discharge status: Home / Self Care | Payer: Federal, State, Local not specified - PPO | Source: Ambulatory Visit | Attending: Radiation Oncology | Admitting: Radiation Oncology

## 2015-06-20 DIAGNOSIS — Z510 Encounter for antineoplastic radiation therapy: Secondary | ICD-10-CM | POA: Diagnosis not present

## 2015-06-21 ENCOUNTER — Ambulatory Visit
Admission: RE | Admit: 2015-06-21 | Discharge: 2015-06-21 | Disposition: A | Discharge status: Home / Self Care | Payer: Federal, State, Local not specified - PPO | Source: Ambulatory Visit | Attending: Radiation Oncology | Admitting: Radiation Oncology

## 2015-06-21 DIAGNOSIS — Z510 Encounter for antineoplastic radiation therapy: Secondary | ICD-10-CM | POA: Insufficient documentation

## 2015-06-21 DIAGNOSIS — C61 Malignant neoplasm of prostate: Secondary | ICD-10-CM | POA: Insufficient documentation

## 2015-06-22 ENCOUNTER — Ambulatory Visit
Admission: RE | Admit: 2015-06-22 | Discharge: 2015-06-22 | Disposition: A | Discharge status: Home / Self Care | Payer: Federal, State, Local not specified - PPO | Source: Ambulatory Visit | Attending: Radiation Oncology | Admitting: Radiation Oncology

## 2015-06-22 DIAGNOSIS — Z510 Encounter for antineoplastic radiation therapy: Secondary | ICD-10-CM | POA: Diagnosis not present

## 2015-06-23 ENCOUNTER — Encounter: Payer: Self-pay | Admitting: Radiation Oncology

## 2015-06-23 ENCOUNTER — Ambulatory Visit
Admission: RE | Admit: 2015-06-23 | Discharge: 2015-06-23 | Disposition: A | Discharge status: Home / Self Care | Payer: Federal, State, Local not specified - PPO | Source: Ambulatory Visit | Attending: Radiation Oncology | Admitting: Radiation Oncology

## 2015-06-23 VITALS — BP 115/75 | HR 66 | Resp 16 | Wt 186.2 lb

## 2015-06-23 DIAGNOSIS — C61 Malignant neoplasm of prostate: Secondary | ICD-10-CM

## 2015-06-23 DIAGNOSIS — Z510 Encounter for antineoplastic radiation therapy: Secondary | ICD-10-CM | POA: Diagnosis not present

## 2015-06-23 NOTE — Progress Notes (Signed)
  Radiation Oncology         [phone]   Name: Charles Marks MRN: [id number]   Date: 06/23/2015  DOB: 1955-10-21    Weekly Radiation Therapy Management    ICD-9-CM ICD-10-CM   1. Malignant neoplasm of prostate ([place]) 185 C61     Current Dose: 41.4 Gy  Planned Dose:  45 Gy + Boost  Narrative The patient presents for routine under treatment assessment.  Weight and vitals stable. Denies pain. Denies dysuria or hematuria. Reports nocturia x 2. Denies difficulty emptying his bladder. Describes a steady urine stream. Reports frequency. Denies incontinence or leakage. Managing diarrhea with imodium. Denies any rectal irritation. Reports mild fatigue. Patient not given one month follow up appointment card because he is geared up for an implant.  Set-up films were reviewed. The chart was checked.  Physical Findings  weight is 186 lb 3.2 oz (84.46 kg). His blood pressure is 115/75 and his pulse is 66. His respiration is 16 and oxygen saturation is 100%.  Weight essentially stable. This is a pleasant male in no acute distress. He is alert and oriented.   Impression The patient is tolerating radiation.  Plan Continue treatment as planned. His seed implant is scheduled at the end of June.    [name] [name], M.D.    This document serves as a record of services personally performed by [name], MD. It was created on his behalf by Lendon Collar, a trained medical scribe. The creation of this record is based on the scribe's personal observations and the provider's statements to them. This document has been checked and approved by the attending provider.

## 2015-06-23 NOTE — Progress Notes (Signed)
Weight and vitals stable. Denies pain. Denies dysuria or hematuria. Reports nocturia x 2. Denies difficulty emptying his bladder. Describes a steady urine stream. Reports frequency. Denies incontinence or leakage. Managing diarrhea with imodium. Denies any rectal irritation. Reports mild fatigue. Patient not given one month follow up appointment card because he is geared up for an implant.   BP 115/75 mmHg  Pulse 66  Resp 16  Wt 186 lb 3.2 oz (84.46 kg)  SpO2 100% Wt Readings from Last 3 Encounters:  06/23/15 186 lb 3.2 oz (84.46 kg)  06/16/15 187 lb 1.6 oz (84.868 kg)  06/09/15 188 lb (85.276 kg)

## 2015-06-26 ENCOUNTER — Ambulatory Visit
Admission: RE | Admit: 2015-06-26 | Discharge: 2015-06-26 | Disposition: A | Discharge status: Home / Self Care | Payer: Federal, State, Local not specified - PPO | Source: Ambulatory Visit | Attending: Radiation Oncology | Admitting: Radiation Oncology

## 2015-06-26 DIAGNOSIS — Z510 Encounter for antineoplastic radiation therapy: Secondary | ICD-10-CM | POA: Diagnosis not present

## 2015-06-27 ENCOUNTER — Encounter: Payer: Self-pay | Admitting: Radiation Oncology

## 2015-06-27 ENCOUNTER — Ambulatory Visit
Admission: RE | Admit: 2015-06-27 | Discharge: 2015-06-27 | Disposition: A | Discharge status: Home / Self Care | Payer: Federal, State, Local not specified - PPO | Source: Ambulatory Visit | Attending: Radiation Oncology | Admitting: Radiation Oncology

## 2015-06-27 DIAGNOSIS — Z510 Encounter for antineoplastic radiation therapy: Secondary | ICD-10-CM | POA: Diagnosis not present

## 2015-07-04 NOTE — Progress Notes (Signed)
  Radiation Oncology         [phone]) [phone] ________________________________  Name: Charles Marks MRN: [id number]  Date: 06/27/2015  DOB: 1955/08/26  End of Treatment Note   ICD-9-CM ICD-10-CM    1. Malignant neoplasm of prostate ([place]) 185 C61     DIAGNOSIS: 60 y.o. gentleman with stage T1c adenocarcinoma of the prostate with a Gleason's score of 4+4 and a PSA of 14.24.     Indication for treatment:  Curative, Prostatic Radiotherapy       Radiation treatment dates:   05/23/2015-06/27/2015  Site/dose:   1. The prostate was treated to 45 Gy in 25 fractions of 1.8 Gy 2. The prostate was boosted with seed implant with 110 Gy for total of 155 Gy  Beams/energy:    1. The prostate was treated to using 4-field box with 15 MV X-rays.  Image guidance was performed with daily cone beam CT prior to each fraction to align to gold markers in the prostate and assure proper bladder and rectal fill volumes.  Immobilization was achieved with BodyFix custom mold. 2. The prostate was boosted with seed implant  Narrative: The patient tolerated radiation treatment relatively well.   The patient experienced some minor urinary irritation and modest fatigue.    Plan: The patient has completed radiation treatment. He will return to radiation oncology clinic for routine followup in one month. I advised him to call or return sooner if he has any questions or concerns related to his recovery or treatment. ________________________________  [name]. [name], M.D.

## 2015-07-10 ENCOUNTER — Telehealth: Payer: Self-pay | Admitting: *Deleted

## 2015-07-10 ENCOUNTER — Ambulatory Visit: Admission: RE | Admit: 2015-07-10 | Payer: Federal, State, Local not specified - PPO | Source: Ambulatory Visit

## 2015-07-10 NOTE — Telephone Encounter (Signed)
CALLED PATIENT TO REMIND OF LABS FOR 07-11-15 FOR IMPLANT ON 07-18-15, SPOKE WITH PATIENT'S WIFE AND SHE IS AWARE OF THIS APPT.

## 2015-07-11 ENCOUNTER — Encounter (HOSPITAL_BASED_OUTPATIENT_CLINIC_OR_DEPARTMENT_OTHER): Payer: Self-pay | Admitting: *Deleted

## 2015-07-11 DIAGNOSIS — F329 Major depressive disorder, single episode, unspecified: Secondary | ICD-10-CM | POA: Diagnosis not present

## 2015-07-11 DIAGNOSIS — E119 Type 2 diabetes mellitus without complications: Secondary | ICD-10-CM | POA: Diagnosis not present

## 2015-07-11 DIAGNOSIS — C61 Malignant neoplasm of prostate: Secondary | ICD-10-CM | POA: Diagnosis present

## 2015-07-11 DIAGNOSIS — K219 Gastro-esophageal reflux disease without esophagitis: Secondary | ICD-10-CM | POA: Diagnosis not present

## 2015-07-11 LAB — COMPREHENSIVE METABOLIC PANEL
ALT: 23 U/L (ref 17–63)
AST: 28 U/L (ref 15–41)
Albumin: 4.7 g/dL (ref 3.5–5.0)
Alkaline Phosphatase: 46 U/L (ref 38–126)
Anion gap: 5 (ref 5–15)
BUN: 13 mg/dL (ref 6–20)
CO2: 30 mmol/L (ref 22–32)
Calcium: 9.7 mg/dL (ref 8.9–10.3)
Chloride: 105 mmol/L (ref 101–111)
Creatinine, Ser: 0.79 mg/dL (ref 0.61–1.24)
GFR calc Af Amer: >60 mL/min (ref >60)
GFR calc non Af Amer: >60 mL/min (ref >60)
Glucose, Bld: 104 mg/dL — ABNORMAL HIGH (ref 65–99)
Potassium: 4.4 mmol/L (ref 3.5–5.1)
Sodium: 140 mmol/L (ref 135–145)
Total Bilirubin: 0.9 mg/dL (ref 0.3–1.2)
Total Protein: 6.9 g/dL (ref 6.5–8.1)

## 2015-07-11 LAB — CBC
HCT: 39.5 % (ref 39.0–52.0)
Hemoglobin: 13.3 g/dL (ref 13.0–17.0)
MCH: 31.1 pg (ref 26.0–34.0)
MCHC: 33.7 g/dL (ref 30.0–36.0)
MCV: 92.5 fL (ref 78.0–100.0)
Platelets: 169 10*3/uL (ref 150–400)
RBC: 4.27 MIL/uL (ref 4.22–5.81)
RDW: 14.4 % (ref 11.5–15.5)
WBC: 3.1 10*3/uL — ABNORMAL LOW (ref 4.0–10.5)

## 2015-07-11 LAB — PROTIME-INR
INR: 1.16 (ref 0.00–1.49)
Prothrombin Time: 14.5 seconds (ref 11.6–15.2)

## 2015-07-11 LAB — APTT: aPTT: 30 seconds (ref 24–37)

## 2015-07-11 NOTE — Progress Notes (Signed)
NPO AFTER MN.  ARRIVE AT 0600.  CURRENT LAB RESULTS , CXR, AND EKG IN CHART AND EPIC.  WILL TAKE ZOLOFT AM DOS W/ SIPS OF WATER AND DO FLEET ENEMA.

## 2015-07-17 ENCOUNTER — Telehealth: Payer: Self-pay | Admitting: *Deleted

## 2015-07-17 NOTE — Progress Notes (Signed)
  Radiation Oncology         [phone] ________________________________  Name: Charles Marks MRN: [id number]  Date: 07/17/2015  DOB: 1955/09/13       Prostate Seed Implant  [id number] Charles Kos, MD  No ref. provider found  DIAGNOSIS: 60 y.o. gentleman with stage T1c adenocarcinoma of the prostate with a Gleason's score of 4+4 and a PSA of 14.24    ICD-9-CM ICD-10-CM   1. Prostate cancer ([place]) 185 C61 CANCELED: DG Chest 2 View     CANCELED: DG Chest 2 View  2. Pre-op testing V72.84 Z01.818 DG Chest 2 View     DG Chest 2 View    PROCEDURE: Insertion of radioactive I-125 seeds into the prostate gland.  RADIATION DOSE: 110 Gy, boost therapy.  TECHNIQUE: Charles Marks was brought to the operating room with the urologist. He was placed in the dorsolithotomy position. He was catheterized and a rectal tube was inserted. The perineum was shaved, prepped and draped. The ultrasound probe was then introduced into the rectum to see the prostate gland.  TREATMENT DEVICE: A needle grid was attached to the ultrasound probe stand and anchor needles were placed.  3D PLANNING: The prostate was imaged in 3D using a sagittal sweep of the prostate probe. These images were transferred to the planning computer. There, the prostate, urethra and rectum were defined on each axial reconstructed image. Then, the software created an optimized 3D plan and a few seed positions were adjusted. The quality of the plan was reviewed using Charles Marks information for the target and the following two organs at risk:  Urethra and Rectum.  Then the accepted plan was uploaded to the seed Selectron afterloading unit.  PROSTATE VOLUME STUDY:  Using transrectal ultrasound the volume of the prostate was verified to be 17.52 cc.  SPECIAL TREATMENT PROCEDURE/SUPERVISION AND HANDLING: The Nucletron FIRST system was used to place the needles under sagittal guidance. A total of 18 needles were used to deposit 48 seeds in the prostate gland. The  individual seed activity was 0.291 mCi.  COMPLEX SIMULATION: At the end of the procedure, an anterior radiograph of the pelvis was obtained to document seed positioning and count. Cystoscopy was performed to check the urethra and bladder.  MICRODOSIMETRY: At the end of the procedure, the patient was emitting 0.035 mR/hr at 1 meter. Accordingly, he was considered safe for hospital discharge.  PLAN: The patient will return to the radiation oncology clinic for post implant CT dosimetry in three weeks.   ________________________________  [name] [name], M.D.

## 2015-07-17 NOTE — Telephone Encounter (Signed)
CALLED PATIENT TO REMIND OF IMPLANT FOR 07-18-15, LVM FOR A RETURN CALL

## 2015-07-17 NOTE — H&P (Signed)
History of Present Illness    [name] is a 60 year old male with newly diagnosed high risk prostate cancer.   History of elevated PSA: He has a history of an elevated PSA of 6.2 in 12/15.   TRUS/BX 1/16 in [place]: No prostate volume recorded.  Pathology: BPH only  TRUS/BX 02/21/15: PSA - 14.24, no abnormality on DRE, prostate volume - 26 cc  Pathology: Adenocarcinoma Gleason score 4+4 = 8 in 1 core and 4+3 = 7 in 2 cores all from the right lobe.  Metastatic workup: Bone scan and CT scan negative for evidence of metastatic disease.  Stage: T1c       Past Medical History  Problems   1. History of depression (Z86.59)  2. History of diabetes mellitus (Z86.39)  3. History of esophageal reflux (Z87.19)   Surgical History  Problems   1. History of Appendectomy  2. History of Needle Biopsy Of Prostate  3. History of Needle Biopsy Of Prostate  4. History of Surgery Vas Deferens Vasectomy   Current Meds  1. Imitrex 100 MG Oral Tablet; TAKE TABLET PRN;  Therapy: (Recorded:19Jan2017) to Recorded  2. Imitrex 6 MG/0.5ML Subcutaneous Solution; INJECT ML PRN;  Therapy: (Recorded:19Jan2017) to Recorded  3. Omeprazole 40 MG Oral Capsule Delayed Release;  Therapy: (Recorded:19Jan2017) to Recorded  4. SEROquel 100 MG Oral Tablet;  Therapy: (Recorded:19Jan2017) to Recorded  5. Sertraline HCl - 50 MG Oral Tablet;  Therapy: (Recorded:19Jan2017) to Recorded   Allergies  Medication   1. No Known Drug Allergies   Family History  Problems   1. Family history of Brain tumor : Mother  2. Family history of diabetes mellitus (Z83.3) : Sister, Aunt, Uncle  3. Family history of lung cancer (Z80.1) : Father, Sister  4. Family history of multiple sclerosis (Z82.0) : Mother   Social History  Problems   1. Married  2. Never a smoker  Review of Systems  Genitourinary, constitutional, skin, eye, otolaryngeal, hematologic/lymphatic, cardiovascular,  pulmonary, endocrine, musculoskeletal, gastrointestinal, neurological and psychiatric system(s) were reviewed and pertinent findings if present are noted and are otherwise negative.   Genitourinary: nocturia.   Neurological: headache.   Psychiatric: anxiety and depression.    Vitals  Vital Signs   Height: 6 ft 3 in  Weight: 185 lb   BMI Calculated: 23.12  BSA Calculated: 2.12  Blood Pressure: 97 / 54  Heart Rate: 59   Physical Exam  Constitutional: Well nourished and well developed . No acute distress.   ENT:. The ears and nose are normal in appearance.   Neck: The appearance of the neck is normal and no neck mass is present.   Pulmonary: No respiratory distress and normal respiratory rhythm and effort.   Cardiovascular: Heart rate and rhythm are normal . No peripheral edema.   Abdomen: The abdomen is soft and nontender. No masses are palpated. No CVA tenderness. No hernias are palpable. No hepatosplenomegaly noted.   Rectal: Rectal exam demonstrates normal sphincter tone, no tenderness and no masses. Estimated prostate size is 1+. The prostate has no nodularity and is not tender. The left seminal vesicle is nonpalpable. The right seminal vesicle is nonpalpable. The perineum is normal on inspection.   Genitourinary: Examination of the penis demonstrates no discharge, no masses, no lesions and a normal meatus. The scrotum is without lesions. The right epididymis is palpably normal and non-tender. The left epididymis is palpably normal and non-tender. The right testis is non-tender and without masses. The left testis is non-tender and without  masses.   Lymphatics: The femoral and inguinal nodes are not enlarged or tender.   Skin: Normal skin turgor, no visible rash and no visible skin lesions.   Neuro/Psych:. Mood and affect are appropriate.     Results/Data  Urine    COLOR  STRAW    APPEARANCE  CLEAR    SPECIFIC GRAVITY   <1.005    pH  6.0    GLUCOSE  NEGATIVE    BILIRUBIN  NEGATIVE    KETONE  NEGATIVE    BLOOD  NEGATIVE    PROTEIN  NEGATIVE    NITRITE  NEGATIVE    LEUKOCYTE ESTERASE  NEGATIVE      The following images/tracing/specimen were independently visualized: CT scan and bone scan as below.   The following radiology reports were reviewed: CT scan and bone scan. Selected Results   CT-PELVIS W/O CONTRAST  22Feb2017 12:00AM  Kathie Rhodes    Test Name  Result  Flag  Reference   CT-PELVIS W/O CONTRAST  (Report)       ** RADIOLOGY REPORT BY [facility] **   CLINICAL DATA: Prostate cancer diagnosed by biopsy 3 weeks ago.  Elevated serum PSA level.   EXAM:  CT PELVIS WITHOUT CONTRAST   TECHNIQUE:  Multidetector CT imaging of the pelvis was performed following the  standard protocol without intravenous contrast.   COMPARISON: None.   FINDINGS:  Urinary Tract: The visualized distal ureters and bladder appear  unremarkable.   Bowel: No bowel wall thickening, distention or surrounding  inflammation identified within the pelvis.   Vascular/Lymphatic: No enlarged pelvic lymph nodes identified. No  significant vascular findings on noncontrast imaging.   Reproductive: The prostate gland is normal in size, measuring 4.6 x  2.9 cm transverse. There is possible minimal asymmetric low-density  peripherally on the left (image 42). No gross extracapsular tumor  demonstrated.   Other: None.   Musculoskeletal: No acute or worrisome findings. 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   IMPRESSION:  1. No evidence of metastatic disease within the pelvis.  2. Possible minimal asymmetric low-density in the left peripheral  zone of the prostate gland.    Electronically Signed  By: Richardean Sale M.D.  On: 03/15/2015 10:37        Partin table results: His  probability of organ confined disease is 22% with a 74% probability of extracapsular extension, a 19% probability of seminal vesicle involvement and a 16% probability of lymph node involvement. He was 5 and 10 year progression free probability with radical prostatectomy would be 33% and 22% respectively.   Assessment    I went over the results of his metastatic workup which has revealed no evidence of pelvic lymphadenopathy or extra prostatic spread. In addition his bone scan was negative for bony metastatic disease. He was very relieved to get this news. We also discussed the fact that the scan didn't show an area of decreased density in the peripheral zone on the left-hand side which is where his prostate biopsy was positive.    Plan  The patient was counseled about the natural history of prostate cancer and the standard treatment options that are available for prostate cancer. It was explained to him how his age and life expectancy, clinical stage, Gleason score, and PSA affect his prognosis, the decision to proceed with additional staging studies, as well as how that information influences recommended treatment strategies. We discussed the roles for active surveillance, radiation therapy, surgical  therapy, androgen deprivation, as well as ablative therapy options for the treatment of prostate cancer as appropriate to his individual cancer situation. We discussed the risks and benefits of these options with regard to their impact on cancer control and also in terms of potential adverse events, complications, and impact on quiality of life particularly related to urinary, bowel, and sexual function. The patient was encouraged to ask questions throughout the discussion today and all questions were answered to his stated satisfaction. In addition, the patient was provided with and/or directed to appropriate resources and literature for further education about prostate cancer and treatment options.     He was primarily interested in radioactive seed implantation. We therefore discussed this in detail. I went over how the seeds were implanted, the outpatient nature of the procedure as well as the associated risks and potential complications of radioactive seeds. Because of his high risk disease I discussed neoadjuvant ADT. Discussed use of Lupron for this we discussed the data behind the use of this form of therapy with improved disease-free survival. I also have discussed the side effects of the medication and duration of treatment. Finally I did inform him that a shortened course of external beam radiation may also be beneficial and would allow Dr. [name] to discuss this further with him.

## 2015-07-17 NOTE — Anesthesia Preprocedure Evaluation (Addendum)
Anesthesia Evaluation  Patient identified by MRN, date of birth, ID band Patient awake    Reviewed: Allergy & Precautions, NPO status , Patient's Chart, lab work & pertinent test results  Airway Mallampati: II  TM Distance: >3 FB Neck ROM: Full    Dental  (+) Dental Advisory Given   Pulmonary neg pulmonary ROS,    breath sounds clear to auscultation       Cardiovascular negative cardio ROS   Rhythm:Regular Rate:Normal     Neuro/Psych Depression negative neurological ROS     GI/Hepatic Neg liver ROS, GERD  ,  Endo/Other  negative endocrine ROS  Renal/GU negative Renal ROS     Musculoskeletal   Abdominal   Peds  Hematology negative hematology ROS (+)   Anesthesia Other Findings   Reproductive/Obstetrics                            Lab Results  Component Value Date   WBC 3.1* 07/11/2015   HGB 13.3 07/11/2015   HCT 39.5 07/11/2015   MCV 92.5 07/11/2015   PLT 169 07/11/2015   Lab Results  Component Value Date   CREATININE 0.79 07/11/2015   BUN 13 07/11/2015   NA 140 07/11/2015   K 4.4 07/11/2015   CL 105 07/11/2015   CO2 30 07/11/2015    Anesthesia Physical Anesthesia Plan  ASA: II  Anesthesia Plan: General   Post-op Pain Management:    Induction: Intravenous  Airway Management Planned: LMA and Oral ETT  Additional Equipment:   Intra-op Plan:   Post-operative Plan: Extubation in OR  Informed Consent: I have reviewed the patients History and Physical, chart, labs and discussed the procedure including the risks, benefits and alternatives for the proposed anesthesia with the patient or authorized representative who has indicated his/her understanding and acceptance.   Dental advisory given  Plan Discussed with: CRNA  Anesthesia Plan Comments:        Anesthesia Quick Evaluation

## 2015-07-18 ENCOUNTER — Ambulatory Visit (HOSPITAL_COMMUNITY): Payer: Federal, State, Local not specified - PPO

## 2015-07-18 ENCOUNTER — Ambulatory Visit (HOSPITAL_BASED_OUTPATIENT_CLINIC_OR_DEPARTMENT_OTHER): Payer: Federal, State, Local not specified - PPO | Admitting: Anesthesiology

## 2015-07-18 ENCOUNTER — Ambulatory Visit (HOSPITAL_BASED_OUTPATIENT_CLINIC_OR_DEPARTMENT_OTHER)
Admission: RE | Admit: 2015-07-18 | Discharge: 2015-07-18 | Disposition: A | Discharge status: Home / Self Care | Payer: Federal, State, Local not specified - PPO | Source: Ambulatory Visit | Attending: Urology | Admitting: Urology

## 2015-07-18 ENCOUNTER — Encounter (HOSPITAL_BASED_OUTPATIENT_CLINIC_OR_DEPARTMENT_OTHER): Payer: Self-pay | Admitting: *Deleted

## 2015-07-18 ENCOUNTER — Encounter (HOSPITAL_BASED_OUTPATIENT_CLINIC_OR_DEPARTMENT_OTHER)
Admission: RE | Disposition: A | Discharge status: Home / Self Care | Payer: Self-pay | Source: Ambulatory Visit | Attending: Urology

## 2015-07-18 DIAGNOSIS — K219 Gastro-esophageal reflux disease without esophagitis: Secondary | ICD-10-CM | POA: Insufficient documentation

## 2015-07-18 DIAGNOSIS — Z01818 Encounter for other preprocedural examination: Secondary | ICD-10-CM

## 2015-07-18 DIAGNOSIS — C61 Malignant neoplasm of prostate: Secondary | ICD-10-CM

## 2015-07-18 DIAGNOSIS — F329 Major depressive disorder, single episode, unspecified: Secondary | ICD-10-CM | POA: Insufficient documentation

## 2015-07-18 DIAGNOSIS — E119 Type 2 diabetes mellitus without complications: Secondary | ICD-10-CM | POA: Insufficient documentation

## 2015-07-18 HISTORY — DX: Nocturia: R35.1

## 2015-07-18 HISTORY — DX: Type 2 diabetes mellitus without complications: E11.9

## 2015-07-18 HISTORY — DX: Personal history of irradiation: Z92.3

## 2015-07-18 HISTORY — PX: RADIOACTIVE SEED IMPLANT: SHX5150

## 2015-07-18 HISTORY — DX: Personal history of other (healed) physical injury and trauma: Z87.828

## 2015-07-18 HISTORY — DX: Frequency of micturition: R35.0

## 2015-07-18 HISTORY — PX: CYSTOSCOPY: SHX5120

## 2015-07-18 HISTORY — DX: Presence of spectacles and contact lenses: Z97.3

## 2015-07-18 LAB — GLUCOSE, CAPILLARY: Glucose-Capillary: 108 mg/dL — ABNORMAL HIGH (ref 65–99)

## 2015-07-18 SURGERY — INSERTION, RADIATION SOURCE, PROSTATE
Anesthesia: General | Site: Prostate

## 2015-07-18 MED ORDER — FENTANYL CITRATE (PF) 100 MCG/2ML IJ SOLN
INTRAMUSCULAR | Status: DC | PRN
  Administered 2015-07-18 (×2): 50 ug via INTRAVENOUS

## 2015-07-18 MED ORDER — KETOROLAC TROMETHAMINE 30 MG/ML IJ SOLN
INTRAMUSCULAR | Status: DC | PRN
Start: 2015-07-18 — End: 2015-07-18
  Administered 2015-07-18: 30 mg via INTRAVENOUS

## 2015-07-18 MED ORDER — CIPROFLOXACIN IN D5W 400 MG/200ML IV SOLN
INTRAVENOUS | Status: AC
  Filled 2015-07-18: qty 200

## 2015-07-18 MED ORDER — SODIUM CHLORIDE 0.9 % IR SOLN
Status: DC | PRN
  Administered 2015-07-18: 1000 mL

## 2015-07-18 MED ORDER — TAMSULOSIN HCL 0.4 MG PO CAPS
ORAL_CAPSULE | ORAL | Status: AC
  Filled 2015-07-18: qty 1

## 2015-07-18 MED ORDER — HYDROCODONE-ACETAMINOPHEN 10-325 MG PO TABS: 1.0000 | ORAL_TABLET | ORAL | Status: DC | PRN

## 2015-07-18 MED ORDER — MIDAZOLAM HCL 5 MG/5ML IJ SOLN
INTRAMUSCULAR | Status: DC | PRN
  Administered 2015-07-18: 2 mg via INTRAVENOUS

## 2015-07-18 MED ORDER — FLEET ENEMA 7-19 GM/118ML RE ENEM
1.0000 | ENEMA | Freq: Once | RECTAL | Status: DC
  Filled 2015-07-18: qty 1

## 2015-07-18 MED ORDER — TAMSULOSIN HCL 0.4 MG PO CAPS
0.4000 mg | ORAL_CAPSULE | Freq: Once | ORAL | Status: AC
  Administered 2015-07-18: 0.4 mg via ORAL
  Filled 2015-07-18: qty 1

## 2015-07-18 MED ORDER — ONDANSETRON HCL 4 MG/2ML IJ SOLN
INTRAMUSCULAR | Status: DC | PRN
Start: 2015-07-18 — End: 2015-07-18
  Administered 2015-07-18: 4 mg via INTRAVENOUS

## 2015-07-18 MED ORDER — FENTANYL CITRATE (PF) 100 MCG/2ML IJ SOLN
INTRAMUSCULAR | Status: AC
  Filled 2015-07-18: qty 2

## 2015-07-18 MED ORDER — LIDOCAINE HCL (CARDIAC) 20 MG/ML IV SOLN
INTRAVENOUS | Status: AC
  Filled 2015-07-18: qty 5

## 2015-07-18 MED ORDER — OXYBUTYNIN CHLORIDE 5 MG PO TABS
ORAL_TABLET | ORAL | Status: AC
  Filled 2015-07-18: qty 1

## 2015-07-18 MED ORDER — PHENYLEPHRINE HCL 10 MG/ML IJ SOLN
10.0000 mg | INTRAVENOUS | Status: DC | PRN
  Administered 2015-07-18: 40 ug/min via INTRAVENOUS

## 2015-07-18 MED ORDER — PHENYLEPHRINE 40 MCG/ML (10ML) SYRINGE FOR IV PUSH (FOR BLOOD PRESSURE SUPPORT)
PREFILLED_SYRINGE | INTRAVENOUS | Status: AC
  Filled 2015-07-18: qty 10

## 2015-07-18 MED ORDER — KETOROLAC TROMETHAMINE 30 MG/ML IJ SOLN
INTRAMUSCULAR | Status: AC
  Filled 2015-07-18: qty 1

## 2015-07-18 MED ORDER — LIDOCAINE 2% (20 MG/ML) 5 ML SYRINGE
INTRAMUSCULAR | Status: DC | PRN
  Administered 2015-07-18: 60 mg via INTRAVENOUS

## 2015-07-18 MED ORDER — MIDAZOLAM HCL 2 MG/2ML IJ SOLN
INTRAMUSCULAR | Status: AC
  Filled 2015-07-18: qty 2

## 2015-07-18 MED ORDER — PROPOFOL 10 MG/ML IV BOLUS
INTRAVENOUS | Status: AC
  Filled 2015-07-18: qty 40

## 2015-07-18 MED ORDER — LACTATED RINGERS IV SOLN
INTRAVENOUS | Status: DC
  Administered 2015-07-18 (×2): via INTRAVENOUS
  Filled 2015-07-18: qty 1000

## 2015-07-18 MED ORDER — PHENYLEPHRINE HCL 10 MG/ML IJ SOLN
INTRAMUSCULAR | Status: DC | PRN
  Administered 2015-07-18 (×2): 80 ug via INTRAVENOUS
  Administered 2015-07-18: 40 ug via INTRAVENOUS

## 2015-07-18 MED ORDER — DEXAMETHASONE SODIUM PHOSPHATE 10 MG/ML IJ SOLN
INTRAMUSCULAR | Status: AC
  Filled 2015-07-18: qty 1

## 2015-07-18 MED ORDER — ONDANSETRON HCL 4 MG/2ML IJ SOLN
INTRAMUSCULAR | Status: AC
  Filled 2015-07-18: qty 2

## 2015-07-18 MED ORDER — OXYBUTYNIN CHLORIDE 5 MG PO TABS
5.0000 mg | ORAL_TABLET | Freq: Once | ORAL | Status: AC
  Administered 2015-07-18: 5 mg via ORAL
  Filled 2015-07-18: qty 1

## 2015-07-18 MED ORDER — PROMETHAZINE HCL 25 MG/ML IJ SOLN
6.2500 mg | INTRAMUSCULAR | Status: DC | PRN
  Filled 2015-07-18: qty 1

## 2015-07-18 MED ORDER — LIDOCAINE HCL (CARDIAC) 20 MG/ML IV SOLN: INTRAVENOUS | Status: DC | PRN

## 2015-07-18 MED ORDER — LIDOCAINE HCL (CARDIAC) 20 MG/ML IV SOLN
INTRAVENOUS | Status: DC | PRN
  Administered 2015-07-18: 60 mg via INTRAVENOUS

## 2015-07-18 MED ORDER — HYDROMORPHONE HCL 1 MG/ML IJ SOLN
0.2500 mg | INTRAMUSCULAR | Status: DC | PRN
  Filled 2015-07-18: qty 1

## 2015-07-18 MED ORDER — DIATRIZOATE MEGLUMINE 30 % UR SOLN
URETHRAL | Status: DC | PRN
  Administered 2015-07-18: 7 mL via URETHRAL

## 2015-07-18 MED ORDER — DEXAMETHASONE SODIUM PHOSPHATE 4 MG/ML IJ SOLN
INTRAMUSCULAR | Status: DC | PRN
  Administered 2015-07-18: 10 mg via INTRAVENOUS

## 2015-07-18 MED ORDER — CIPROFLOXACIN IN D5W 400 MG/200ML IV SOLN
400.0000 mg | INTRAVENOUS | Status: AC
  Administered 2015-07-18: 400 mg via INTRAVENOUS
  Filled 2015-07-18: qty 200

## 2015-07-18 MED ORDER — HYDROCODONE-ACETAMINOPHEN 7.5-325 MG PO TABS
1.0000 | ORAL_TABLET | Freq: Once | ORAL | Status: DC | PRN
  Filled 2015-07-18: qty 1

## 2015-07-18 MED ORDER — PROPOFOL 10 MG/ML IV BOLUS
INTRAVENOUS | Status: DC | PRN
  Administered 2015-07-18: 200 mg via INTRAVENOUS

## 2015-07-18 SURGICAL SUPPLY — 34 items
BAG URINE DRAINAGE (UROLOGICAL SUPPLIES) ×3 IMPLANT
BLADE CLIPPER SURG (BLADE) ×3 IMPLANT
CATH FOLEY 2WAY SLVR  5CC 16FR (CATHETERS) ×2
CATH FOLEY 2WAY SLVR 5CC 16FR (CATHETERS) ×4 IMPLANT
CATH ROBINSON RED A/P 20FR (CATHETERS) ×3 IMPLANT
CLOTH BEACON ORANGE TIMEOUT ST (SAFETY) ×3 IMPLANT
COVER BACK TABLE 60X90IN (DRAPES) ×3 IMPLANT
COVER MAYO STAND STRL (DRAPES) ×3 IMPLANT
DRSG TEGADERM 4X4.75 (GAUZE/BANDAGES/DRESSINGS) ×3 IMPLANT
DRSG TEGADERM 8X12 (GAUZE/BANDAGES/DRESSINGS) ×6 IMPLANT
GLOVE BIO SURGEON STRL SZ 6.5 (GLOVE) ×3 IMPLANT
GLOVE BIO SURGEON STRL SZ8 (GLOVE) ×6 IMPLANT
GLOVE BIOGEL PI IND STRL 6.5 (GLOVE) ×4 IMPLANT
GLOVE BIOGEL PI IND STRL 7.5 (GLOVE) ×2 IMPLANT
GLOVE BIOGEL PI INDICATOR 6.5 (GLOVE) ×2
GLOVE BIOGEL PI INDICATOR 7.5 (GLOVE) ×1
GLOVE ECLIPSE 8.0 STRL XLNG CF (GLOVE) IMPLANT
GOWN STRL REUS W/ TWL XL LVL3 (GOWN DISPOSABLE) IMPLANT
GOWN STRL REUS W/TWL LRG LVL3 (GOWN DISPOSABLE) ×3 IMPLANT
GOWN STRL REUS W/TWL XL LVL3 (GOWN DISPOSABLE) ×3 IMPLANT
GOWN XL W/COTTON TOWEL STD (GOWNS) ×3 IMPLANT
HOLDER FOLEY CATH W/STRAP (MISCELLANEOUS) ×3 IMPLANT
IV NS 1000ML (IV SOLUTION) ×1
IV NS 1000ML BAXH (IV SOLUTION) ×2 IMPLANT
IV WATER IRR. 1000ML (IV SOLUTION) IMPLANT
KIT ROOM TURNOVER WOR (KITS) ×3 IMPLANT
MANIFOLD NEPTUNE II (INSTRUMENTS) IMPLANT
NUCLETRON SELECTSEED I-125 ×3 IMPLANT
PACK CYSTO (CUSTOM PROCEDURE TRAY) ×3 IMPLANT
SPONGE GAUZE 4X4 12PLY STER LF (GAUZE/BANDAGES/DRESSINGS) ×3 IMPLANT
SYRINGE 10CC LL (SYRINGE) ×6 IMPLANT
TUBE CONNECTING 12X1/4 (SUCTIONS) IMPLANT
UNDERPAD 30X30 INCONTINENT (UNDERPADS AND DIAPERS) ×6 IMPLANT
WATER STERILE IRR 500ML POUR (IV SOLUTION) ×3 IMPLANT

## 2015-07-18 NOTE — Discharge Instructions (Signed)

## 2015-07-18 NOTE — Op Note (Signed)
PATIENT:  Charles Marks  PRE-OPERATIVE DIAGNOSIS:  Adenocarcinoma of the prostate  POST-OPERATIVE DIAGNOSIS:  Same  PROCEDURE:  Procedure(s): 1. I-125 radioactive seed implantation 2. Cystoscopy  SURGEON:  Surgeon(s): Claybon Jabs  Radiation oncologist: Dr. [name]  ANESTHESIA:  General  EBL:  Minimal  DRAINS: None  INDICATION: Charles Marks is a 60 year old male with biopsy-proven adenocarcinoma of the prostate. We have discussed treatment options and he has elected to proceed with redirected seed implantation.  Description of procedure: After informed consent the patient was brought to the major OR, placed on the table and administered general anesthesia. He was then moved to the modified lithotomy position with his perineum perpendicular to the floor. His perineum and genitalia were then sterilely prepped. An official timeout was then performed. A 16 French Foley catheter was then placed in the bladder and filled with dilute contrast, a rectal tube was placed in the rectum and the transrectal ultrasound probe was placed in the rectum and affixed to the stand. He was then sterilely draped.  Real time ultrasonography was used along with the seed planning software [employer] Prostate vs. 4.2.21. This was used to develop the seed plan including the number of needles as well as number of seeds required for complete and adequate coverage. Real-time ultrasonography was then used along with the previously developed plan and the Nucletron device to implant a total of 48 seeds using 18 needles. This proceeded without difficulty or complication.  A Foley catheter was then removed as well as the transrectal ultrasound probe and rectal probe. Flexible cystoscopy was then performed using the 17 French flexible scope which revealed a normal urethra throughout its length down to the sphincter which appeared intact. The prostatic urethra revealed bilobar hypertrophy but no evidence of  obstruction, seeds, spacers or lesions. The bladder was then entered and fully and systematically inspected. The ureteral orifices were noted to be of normal configuration and position. The mucosa revealed no evidence of tumors. There were also no stones identified within the bladder. I noted no seeds or spacers on the floor of the bladder and retroflexion of the scope revealed no seeds protruding from the base of the prostate.  The cystoscope was then removed. He tolerated procedure well and there were no intraoperative complications and was taken to the recovery room in stable and satisfactory condition.

## 2015-07-18 NOTE — Anesthesia Postprocedure Evaluation (Signed)
Anesthesia Post Note  Patient: Achyuth Wilbourne  Procedure(s) Performed: Procedure(s) (LRB): RADIOACTIVE SEED IMPLANT/BRACHYTHERAPY IMPLANT (N/A) CYSTOSCOPY FLEXIBLE (N/A)  Patient location during evaluation: PACU Anesthesia Type: General Level of consciousness: awake and alert Pain management: pain level controlled Vital Signs Assessment: post-procedure vital signs reviewed and stable Respiratory status: spontaneous breathing, nonlabored ventilation, respiratory function stable and patient connected to nasal cannula oxygen Cardiovascular status: blood pressure returned to baseline and stable Postop Assessment: no signs of nausea or vomiting Anesthetic complications: no    Last Vitals:  Filed Vitals:   07/18/15 0945 07/18/15 0952  BP: 117/62   Pulse: 57 60  Temp:    Resp: 14     Last Pain:  Filed Vitals:   07/18/15 0953  PainSc: Tyler Deis

## 2015-07-18 NOTE — Transfer of Care (Signed)
Immediate Anesthesia Transfer of Care Note  Patient: Charles Marks  Procedure(s) Performed: Procedure(s) with comments: RADIOACTIVE SEED IMPLANT/BRACHYTHERAPY IMPLANT (N/A) -    66  seeds implanted CYSTOSCOPY FLEXIBLE (N/A) - no seeds found in bladder  Patient Location: PACU  Anesthesia Type:General  Level of Consciousness: awake, alert  and oriented  Airway & Oxygen Therapy: Patient Spontanous Breathing and Patient connected to nasal cannula oxygen  Post-op Assessment: Report given to RN  Post vital signs: Reviewed and stable  Last Vitals: 122/61, 100%, 97.8 Filed Vitals:   07/18/15 0607 07/18/15 0910  BP: 111/65   Pulse: 62 54  Temp: 37 C   Resp: 14 9    Last Pain: There were no vitals filed for this visit.    Patients Stated Pain Goal: 3 ([id number])  Complications: No apparent anesthesia complications

## 2015-07-18 NOTE — Anesthesia Procedure Notes (Signed)
Procedure Name: LMA Insertion Date/Time: 07/18/2015 7:42 AM Performed by: Bethena Roys T Pre-anesthesia Checklist: Patient identified, Emergency Drugs available, Suction available and Patient being monitored Patient Re-evaluated:Patient Re-evaluated prior to inductionOxygen Delivery Method: Circle system utilized Preoxygenation: Pre-oxygenation with 100% oxygen Intubation Type: IV induction Ventilation: Mask ventilation without difficulty LMA: LMA inserted LMA Size: 5.0 Number of attempts: 1 Airway Equipment and Method: Bite block Placement Confirmation: positive ETCO2 Tube secured with: Tape Dental Injury: Teeth and Oropharynx as per pre-operative assessment

## 2015-07-19 ENCOUNTER — Encounter (HOSPITAL_BASED_OUTPATIENT_CLINIC_OR_DEPARTMENT_OTHER): Payer: Self-pay | Admitting: Urology

## 2015-08-10 ENCOUNTER — Telehealth: Payer: Self-pay | Admitting: *Deleted

## 2015-08-10 NOTE — Telephone Encounter (Signed)
Called patient to remind of post seed appts. For 08-11-15, lvm for a return call

## 2015-08-11 ENCOUNTER — Ambulatory Visit
Admission: RE | Admit: 2015-08-11 | Discharge: 2015-08-11 | Disposition: A | Discharge status: Home / Self Care | Payer: Federal, State, Local not specified - PPO | Source: Ambulatory Visit | Attending: Radiation Oncology | Admitting: Radiation Oncology

## 2015-08-11 ENCOUNTER — Ambulatory Visit
Admit: 2015-08-11 | Discharge: 2015-08-11 | Disposition: A | Discharge status: Home / Self Care | Payer: Federal, State, Local not specified - PPO | Attending: Radiation Oncology | Admitting: Radiation Oncology

## 2015-08-11 ENCOUNTER — Encounter: Payer: Self-pay | Admitting: Radiation Oncology

## 2015-08-11 VITALS — BP 120/66 | HR 67 | Resp 16 | Wt 189.5 lb

## 2015-08-11 DIAGNOSIS — Z510 Encounter for antineoplastic radiation therapy: Secondary | ICD-10-CM | POA: Diagnosis not present

## 2015-08-11 DIAGNOSIS — C61 Malignant neoplasm of prostate: Secondary | ICD-10-CM | POA: Insufficient documentation

## 2015-08-11 NOTE — Progress Notes (Signed)
Radiation Oncology         [phone] ________________________________  Name: Charles Marks MRN: [id number]  Date: 08/11/2015  DOB: 05-26-1955  Follow-Up Visit Note  CC: [place] Pink, MD  Kathie Rhodes, MD  Diagnosis:   60 year old gentleman with stage T1c adenocarcinoma of the prostate with a Gleason's score of 4+4 and a PSA of 14.24  Interval Since Last Radiation:  3 weeks   Narrative:  The patient returns today for routine follow-up.  He is complaining of increased urinary frequency and urinary hesitation symptoms. He filled out a questionnaire regarding urinary function today providing and overall IPSS score of 25 characterizing his symptoms as severe.  His pre-implant score was 2. He denies any bowel symptoms. Reports rapaflo has greatly improved his urinary symptoms.  Reports frequency, intermittent stream, urgency and nocturia nearly resolved with rapaflo. Reports dysuria has resolved.  Denies hematuria.  Denies leakage or incontinence.    ALLERGIES:  has No Known Allergies.  Meds: Current Outpatient Prescriptions  Medication Sig Dispense Refill  . Cholecalciferol (VITAMIN D3) 1000 units CAPS Take 1 capsule by mouth daily.     . Cyanocobalamin (VITAMIN B-12) 5000 MCG SUBL Place under the tongue.    . ferrous sulfate 325 (65 FE) MG tablet Take 325 mg by mouth daily.     Marland Kitchen loperamide (IMODIUM) 2 MG capsule Take 2 mg by mouth as needed for diarrhea or loose stools.    . Multiple Vitamin (MULTI-VITAMINS) TABS Take 1 tablet by mouth daily.     Marland Kitchen omeprazole (PRILOSEC) 40 MG capsule     . QUEtiapine Fumarate (SEROQUEL XR) 150 MG 24 hr tablet Take 150 mg by mouth at bedtime.     . sertraline (ZOLOFT) 100 MG tablet     . SUMAtriptan (IMITREX) 100 MG tablet     . vitamin C (ASCORBIC ACID) 500 MG tablet Take 500 mg by mouth daily.     Marland Kitchen HYDROcodone-acetaminophen (NORCO) 10-325 MG tablet Take 1-2 tablets by mouth every 4 (four) hours as needed for moderate pain. Maximum dose per 24  hours - 8 pills (Patient not taking: Reported on 08/11/2015) 18 tablet 0   No current facility-administered medications for this encounter.    Physical Findings: The patient is in no acute distress. Patient is alert and oriented.  weight is 189 lb 8 oz (85.957 kg). His blood pressure is 120/66 and his pulse is 67. His respiration is 16 and oxygen saturation is 100%. .  No significant changes.  Lab Findings: Lab Results  Component Value Date   WBC 3.1* 07/11/2015   HGB 13.3 07/11/2015   HCT 39.5 07/11/2015   MCV 92.5 07/11/2015   PLT 169 07/11/2015    Radiographic Findings:  Patient underwent CT imaging in our clinic for post implant dosimetry. The CT appears to demonstrate an adequate distribution of radioactive seeds throughout the prostate gland. There no seeds in her near the rectum. I suspect the final radiation plan and dosimetry will show appropriate coverage of the prostate gland.   Impression: The patient is recovering from the effects of radiation. His urinary symptoms should gradually improve over the next 4-6 months. We talked about this today. He is encouraged by his improvement already and is otherwise please with his outcome.   Plan: Today, I spent time talking to the patient about his prostate seed implant and resolving urinary symptoms. We also talked about long-term follow-up for prostate cancer following seed implant. He understands that ongoing PSA determinations  and digital rectal exams will help perform surveillance to rule out disease recurrence. He understands what to expect with his PSA measures. Patient was also educated today about some of the long-term effects from radiation including a small risk for rectal bleeding and possibly erectile dysfunction. We talked about some of the general management approaches to these potential complications. However, I did encourage the patient to contact our office or return at any point if he has questions or concerns related to  his previous radiation and prostate cancer.  _____________________________________  [name]. [name], M.D.    This document serves as a record of services personally performed by [name], MD. It was created on his behalf by Truddie Hidden, a trained medical scribe. The creation of this record is based on the scribe's personal observations and the provider's statements to them. This document has been checked and approved by the attending provider.

## 2015-08-11 NOTE — Progress Notes (Signed)
Weight and vitals stable. Denies pain. Pre seed IPSS 2. Post seed IPSS 25. Reports rapaflo has greatly improved his urinary symptoms. Reports frequency, intermittent stream, urgency and nocturia nearly resolved with rapaflo. Reports dysuria has resolved. Denies hematuria. Denies leakage or incontinence.   BP 120/66 mmHg  Pulse 67  Resp 16  Wt 189 lb 8 oz (85.957 kg)  SpO2 100% Wt Readings from Last 3 Encounters:  08/11/15 189 lb 8 oz (85.957 kg)  07/18/15 186 lb 8 oz (84.596 kg)  06/23/15 186 lb 3.2 oz (84.46 kg)

## 2015-08-11 NOTE — Progress Notes (Signed)
  Radiation Oncology         [phone] ________________________________  Name: Charles Marks MRN: [id number]  Date: 08/11/2015  DOB: Nov 24, 1955  COMPLEX SIMULATION NOTE  NARRATIVE:  The patient was brought to the [facility] today following prostate seed implantation approximately one month ago.  Identity was confirmed.  All relevant records and images related to the planned course of therapy were reviewed.  Then, the patient was set-up supine.  CT images were obtained.  The CT images were loaded into the planning software.  Then the prostate and rectum were contoured.  Treatment planning then occurred.  The implanted iodine 125 seeds were identified by the physics staff for projection of radiation distribution  I have requested : 3D Simulation  I have requested a DVH of the following structures: Prostate and rectum.    ________________________________  [name] [name], M.D.

## 2015-08-14 ENCOUNTER — Emergency Department
Admission: EM | Admit: 2015-08-14 | Discharge: 2015-08-14 | Disposition: A | Discharge status: Home / Self Care | Payer: Federal, State, Local not specified - PPO | Attending: Emergency Medicine | Admitting: Emergency Medicine

## 2015-08-14 DIAGNOSIS — Z79899 Other long term (current) drug therapy: Secondary | ICD-10-CM | POA: Diagnosis not present

## 2015-08-14 DIAGNOSIS — Z8719 Personal history of other diseases of the digestive system: Secondary | ICD-10-CM | POA: Insufficient documentation

## 2015-08-14 DIAGNOSIS — R1033 Periumbilical pain: Secondary | ICD-10-CM | POA: Diagnosis present

## 2015-08-14 DIAGNOSIS — C61 Malignant neoplasm of prostate: Secondary | ICD-10-CM | POA: Insufficient documentation

## 2015-08-14 LAB — COMPREHENSIVE METABOLIC PANEL
ALBUMIN: 4.7 g/dL (ref 3.5–5.0)
ALK PHOS: 52 U/L (ref 38–126)
ALT: 27 U/L (ref 17–63)
AST: 39 U/L (ref 15–41)
Anion gap: 8 (ref 5–15)
BUN: 11 mg/dL (ref 6–20)
CHLORIDE: 104 mmol/L (ref 101–111)
CO2: 27 mmol/L (ref 22–32)
CREATININE: 0.69 mg/dL (ref 0.61–1.24)
Calcium: 9.7 mg/dL (ref 8.9–10.3)
GFR calc non Af Amer: >60 mL/min (ref >60)
GLUCOSE: 115 mg/dL — AB (ref 65–99)
Potassium: 4.5 mmol/L (ref 3.5–5.1)
SODIUM: 139 mmol/L (ref 135–145)
Total Bilirubin: 1.3 mg/dL — ABNORMAL HIGH (ref 0.3–1.2)
Total Protein: 7.2 g/dL (ref 6.5–8.1)

## 2015-08-14 LAB — CBC WITH DIFFERENTIAL/PLATELET
BASOS PCT: 0 %
Basophils Absolute: 0 10*3/uL (ref 0–0.1)
EOS ABS: 0.1 10*3/uL (ref 0–0.7)
Eosinophils Relative: 1 %
HCT: 42.5 % (ref 40.0–52.0)
HEMOGLOBIN: 14.3 g/dL (ref 13.0–18.0)
Lymphocytes Relative: 13 %
Lymphs Abs: 0.5 10*3/uL — ABNORMAL LOW (ref 1.0–3.6)
MCH: 31.2 pg (ref 26.0–34.0)
MCHC: 33.7 g/dL (ref 32.0–36.0)
MCV: 92.5 fL (ref 80.0–100.0)
MONO ABS: 0.3 10*3/uL (ref 0.2–1.0)
MONOS PCT: 9 %
NEUTROS PCT: 77 %
Neutro Abs: 2.8 10*3/uL (ref 1.4–6.5)
PLATELETS: 168 10*3/uL (ref 150–440)
RBC: 4.59 MIL/uL (ref 4.40–5.90)
RDW: 14.2 % (ref 11.5–14.5)
WBC: 3.8 10*3/uL (ref 3.8–10.6)

## 2015-08-14 LAB — LIPASE, BLOOD: Lipase: 27 U/L (ref 11–51)

## 2015-08-14 NOTE — ED Triage Notes (Signed)
Reports squeezing feeling in meddle abd onset an hour ago.  Denies n/v/d.

## 2015-08-14 NOTE — Discharge Instructions (Signed)
Please return to the emergency department if you develop worsening pain, inability to keep down fluids, fever, or any other symptoms concerning to you.

## 2015-08-14 NOTE — ED Provider Notes (Signed)
[facility] Emergency Department Provider Note  ____________________________________________  Time seen: Approximately 5:31 PM  I have reviewed the triage vital signs and the nursing notes.   HISTORY  Chief Complaint Abdominal Pain    HPI Charles Marks is a 60 y.o. male with a history of GERD, prostate cancer undergoing treatment, s/p appy remotely, presenting with periumbilical pain. The patient reports that after eating lunch today around 12:30 he developed a severe 8/10 "squeezing" pain "2 cm above my belly button." He tried Pepto-Bismol and walking around, and this did not help so he went to urgent care. He denies any nausea or vomiting, diarrhea or constipation, fever or chills. He did not have any new foods for lunch. At this time, the pain has significantly eased off and is now just a dull ache.   Past Medical History:  Diagnosis Date  . Depression   . Frequency of urination    since radiation tx  . GERD (gastroesophageal reflux disease)   . History of traumatic head injury    age 71--  head injury w/ fractured skull in coma for 3 days made full recovery withou further treatment--  no residual  . Nocturia   . Prostate cancer [facility]) urologist-  dr ottelin/  oncologist- dr [name]   T1c, Gleason 4+4,  PSA 14.24,  vol 25.6cc/  IM radiation therapy (05-23-2015 to 06-27-2015)    . S/P radiation therapy 05-23-2015  to  06-27-2015   prostatic fossa  68.4 Gy in 38 fractions in 1.8 Gy  . Type 2 diabetes, diet controlled ([place])   . Wears glasses     Patient Active Problem List   Diagnosis Date Noted  . Malignant neoplasm of prostate ([place]) 03/23/2015    Past Surgical History:  Procedure Laterality Date  . APPENDECTOMY  1975  . COLONOSCOPY  2012 approx  . CYSTOSCOPY N/A 07/18/2015   Procedure: CYSTOSCOPY FLEXIBLE;  Surgeon: Kathie Rhodes, MD;  Location[facility];  Service: Urology;  Laterality: N/A;  no seeds found in bladder  . PROSTATE  BIOPSY    . RADIOACTIVE SEED IMPLANT N/A 07/18/2015   Procedure: RADIOACTIVE SEED IMPLANT/BRACHYTHERAPY IMPLANT;  Surgeon: Kathie Rhodes, MD;  Location: [facility];  Service: Urology;  Laterality: N/A;     48  seeds implanted    Current Outpatient Rx  . Order #: [id number] Class: Historical Med  . Order #: [id number] Class: Historical Med  . Order #: [id number] Class: Historical Med  . Order #: [id number] Class: Print  . Order #: [id number] Class: Historical Med  . Order #: [id number] Class: Historical Med  . Order #: [id number] Class: Historical Med  . Order #: [id number] Class: Historical Med  . Order #: [id number] Class: Historical Med  . Order #: [id number] Class: Historical Med  . Order #: [id number] Class: Historical Med    Allergies Review of patient's allergies indicates no known allergies.  Family History  Problem Relation Age of Onset  . Cancer Mother     brain tumor  . Multiple sclerosis Mother   . Diabetes Sister   . Cancer Sister     lung    Social History Social History  Substance Use Topics  . Smoking status: Never Smoker  . Smokeless tobacco: Never Used  . Alcohol use Yes     Comment: occasional beer/wine     Review of Systems Constitutional: No fever/chills.No lightheadedness or syncope. Eyes: No visual changes. ENT: No sore throat. No congestion or rhinorrhea. Cardiovascular: Denies chest pain. Denies palpitations. Respiratory: Denies shortness of breath.  No cough. Gastrointestinal: Positive periumbilical abdominal pain.  No nausea, no vomiting.  No diarrhea.  No constipation. Genitourinary: Negative for dysuria. No testicular or penile or scrotal pain. Musculoskeletal: Negative for back pain. Skin: Negative for rash. Neurological: Negative for headaches. No focal numbness, tingling or weakness.   10-point ROS otherwise negative.  ____________________________________________   PHYSICAL EXAM:  VITAL SIGNS: ED Triage Vitals  Enc Vitals Group      BP 08/14/15 1514 (!) 116/39     Pulse Rate 08/14/15 1514 (!) 58     Resp 08/14/15 1514 18     Temp 08/14/15 1514 98.3 F (36.8 C)     Temp Source 08/14/15 1514 Oral     SpO2 08/14/15 1514 97 %     Weight 08/14/15 1514 185 lb (83.9 kg)     Height 08/14/15 1514 6\' 3"  (1.905 m)     Head Circumference --      Peak Flow --      Pain Score 08/14/15 1515 5     Pain Loc --      Pain Edu? --      Excl. in [place]? --     Constitutional: Alert and oriented. Well appearing and in no acute distress. Answers questions appropriately. Eyes: Conjunctivae are normal.  EOMI. No scleral icterus. Head: Atraumatic. Nose: No congestion/rhinnorhea. Mouth/Throat: Mucous membranes are moist.  Neck: No stridor.  Supple.   Cardiovascular: Normal rate, regular rhythm. No murmurs, rubs or gallops.  Respiratory: Normal respiratory effort.  No accessory muscle use or retractions. Lungs CTAB.  No wheezes, rales or ronchi. Gastrointestinal: Soft and nondistended.  Minimal tenderness to palpation just above the umbilicus but not in the epigastric pain. No Murphy sign. No guarding or rebound.  No peritoneal signs. Musculoskeletal: No LE edema. No ttp in the calves or palpable cords.  Negative Homan's sign. Neurologic:  A&Ox3.  Speech is clear.  Face and smile are symmetric.  EOMI.  Moves all extremities well. Skin:  Skin is warm, dry and intact. No rash noted. Psychiatric: Mood is normal with anxious affect. Speech and behavior are normal.  Normal judgement.  ____________________________________________   LABS (all labs ordered are listed, but only abnormal results are displayed)  Labs Reviewed  CBC WITH DIFFERENTIAL/PLATELET - Abnormal; Notable for the following:       Result Value   Lymphs Abs 0.5 (*)    All other components within normal limits  COMPREHENSIVE METABOLIC PANEL - Abnormal; Notable for the following:    Glucose, Bld 115 (*)    Total Bilirubin 1.3 (*)    All other components within normal limits   LIPASE, BLOOD   ____________________________________________  EKG  ED ECG REPORT I, Eula Listen, the attending physician, personally viewed and interpreted this ECG.   Date: 08/14/2015  EKG Time: 1523  Rate: 57  Rhythm: sinus bradycardia  Axis: normal  Intervals:none  ST&T Change: No ST elevation.  ____________________________________________  RADIOLOGY  No results found.  ____________________________________________   PROCEDURES  Procedure(s) performed: None  Procedures  Critical Care performed: No ____________________________________________   INITIAL IMPRESSION / ASSESSMENT AND PLAN / ED COURSE  Pertinent labs & imaging results that were available during my care of the patient were reviewed by me and considered in my medical decision making (see chart for details).  60 y.o. male presenting with a squeezing periumbilical pain that has almost completely resolved without any other associated symptoms. Overall, the patient is well-appearing and has stable vital signs. His pain appears  to be resolving on its own. His laboratory studies are reassuring. At this time, it is unlikely that the patient has an acute abdominal surgical pathology or infection. We discussed symptomatic treatment and the patient has deferred any pain medication at this time. He does not meet criteria for imaging, but I did give him strict return precautions as well as follow-up instructions. The differential diagnosis for his symptoms include gas, and much less likely at this time I would consider early GI illness, intermittent volvulus although this be very unlikely given his age and that he is ambulatory. Again an acute intra-abdominal pathology is very unlikely.  ____________________________________________  FINAL CLINICAL IMPRESSION(S) / ED DIAGNOSES  Final diagnoses:  Periumbilical pain    Clinical Course      NEW MEDICATIONS STARTED DURING THIS VISIT:  New Prescriptions    No medications on file      Eula Listen, MD 08/14/15 1737

## 2015-08-14 NOTE — ED Notes (Signed)
Triage done by c Adler Alton rn

## 2015-08-14 NOTE — ED Notes (Signed)
MD at bedside. Pts wife at bedside as well. Pt in NAD.

## 2015-08-21 ENCOUNTER — Encounter: Payer: Self-pay | Admitting: Radiation Oncology

## 2015-08-21 DIAGNOSIS — Z510 Encounter for antineoplastic radiation therapy: Secondary | ICD-10-CM | POA: Diagnosis not present

## 2015-08-21 NOTE — Progress Notes (Signed)
  Radiation Oncology         [phone] ________________________________  Name: Charles Marks MRN: [id number]  Date: 08/21/2015  DOB: 07/28/1955  3D Planning Note   Prostate Brachytherapy Post-Implant Dosimetry  Diagnosis: 60 y.o. gentleman with stage T1c adenocarcinoma of the prostate with a Gleason's score of 4+4 and a PSA of 14.24  Narrative: On a previous date, [name] returned following prostate seed implantation for post implant planning. He underwent CT scan complex simulation to delineate the three-dimensional structures of the pelvis and demonstrate the radiation distribution.  Since that time, the seed localization, and complex isodose planning with dose volume histograms have now been completed.  Results:   Prostate Coverage - The dose of radiation delivered to the 90% or more of the prostate gland (D90) was 110.16% of the prescription dose. This exceeds our goal of greater than 90%. Rectal Sparing - The volume of rectal tissue receiving the prescription dose or higher was 0.08 cc. This falls under our thresholds tolerance of 1.0 cc.  Impression: The prostate seed implant appears to show adequate target coverage and appropriate rectal sparing.  Plan:  The patient will continue to follow with urology for ongoing PSA determinations. I would anticipate a high likelihood for local tumor control with minimal risk for rectal morbidity.  ________________________________  [name] [name], M.D.

## 2016-04-26 ENCOUNTER — Encounter: Payer: Self-pay | Admitting: *Deleted

## 2016-04-29 ENCOUNTER — Encounter: Payer: Self-pay | Admitting: *Deleted

## 2016-04-29 ENCOUNTER — Encounter
Admission: RE | Disposition: A | Discharge status: Home / Self Care | Payer: Self-pay | Source: Ambulatory Visit | Attending: Unknown Physician Specialty

## 2016-04-29 ENCOUNTER — Ambulatory Visit
Admission: RE | Admit: 2016-04-29 | Discharge: 2016-04-29 | Disposition: A | Discharge status: Home / Self Care | Payer: Federal, State, Local not specified - PPO | Source: Ambulatory Visit | Attending: Unknown Physician Specialty | Admitting: Unknown Physician Specialty

## 2016-04-29 ENCOUNTER — Ambulatory Visit: Payer: Federal, State, Local not specified - PPO | Admitting: Anesthesiology

## 2016-04-29 DIAGNOSIS — Z86010 Personal history of colonic polyps: Secondary | ICD-10-CM | POA: Insufficient documentation

## 2016-04-29 DIAGNOSIS — Z955 Presence of coronary angioplasty implant and graft: Secondary | ICD-10-CM | POA: Insufficient documentation

## 2016-04-29 DIAGNOSIS — K6289 Other specified diseases of anus and rectum: Secondary | ICD-10-CM | POA: Diagnosis not present

## 2016-04-29 DIAGNOSIS — Z923 Personal history of irradiation: Secondary | ICD-10-CM | POA: Insufficient documentation

## 2016-04-29 DIAGNOSIS — Z8546 Personal history of malignant neoplasm of prostate: Secondary | ICD-10-CM | POA: Diagnosis not present

## 2016-04-29 DIAGNOSIS — E119 Type 2 diabetes mellitus without complications: Secondary | ICD-10-CM | POA: Diagnosis not present

## 2016-04-29 DIAGNOSIS — F329 Major depressive disorder, single episode, unspecified: Secondary | ICD-10-CM | POA: Insufficient documentation

## 2016-04-29 DIAGNOSIS — R51 Headache: Secondary | ICD-10-CM | POA: Diagnosis not present

## 2016-04-29 DIAGNOSIS — Z79899 Other long term (current) drug therapy: Secondary | ICD-10-CM | POA: Diagnosis not present

## 2016-04-29 DIAGNOSIS — K219 Gastro-esophageal reflux disease without esophagitis: Secondary | ICD-10-CM | POA: Insufficient documentation

## 2016-04-29 DIAGNOSIS — K640 First degree hemorrhoids: Secondary | ICD-10-CM | POA: Diagnosis not present

## 2016-04-29 DIAGNOSIS — Z1211 Encounter for screening for malignant neoplasm of colon: Secondary | ICD-10-CM | POA: Insufficient documentation

## 2016-04-29 HISTORY — DX: Reserved for concepts with insufficient information to code with codable children: IMO0002

## 2016-04-29 HISTORY — DX: Headache, unspecified: R51.9

## 2016-04-29 HISTORY — DX: Headache: R51

## 2016-04-29 HISTORY — PX: COLONOSCOPY WITH PROPOFOL: SHX5780

## 2016-04-29 SURGERY — COLONOSCOPY WITH PROPOFOL
Anesthesia: General

## 2016-04-29 MED ORDER — MIDAZOLAM HCL 2 MG/2ML IJ SOLN
INTRAMUSCULAR | Status: AC
  Filled 2016-04-29: qty 2

## 2016-04-29 MED ORDER — SODIUM CHLORIDE 0.9 % IV SOLN: INTRAVENOUS | Status: DC

## 2016-04-29 MED ORDER — PHENYLEPHRINE HCL 10 MG/ML IJ SOLN
INTRAMUSCULAR | Status: DC | PRN
  Administered 2016-04-29 (×2): 100 ug via INTRAVENOUS

## 2016-04-29 MED ORDER — PROPOFOL 10 MG/ML IV BOLUS
INTRAVENOUS | Status: DC | PRN
  Administered 2016-04-29: 40 mg via INTRAVENOUS

## 2016-04-29 MED ORDER — SODIUM CHLORIDE 0.9 % IV SOLN
INTRAVENOUS | Status: DC
  Administered 2016-04-29: 13:00:00 via INTRAVENOUS

## 2016-04-29 MED ORDER — PROPOFOL 500 MG/50ML IV EMUL
INTRAVENOUS | Status: DC | PRN
  Administered 2016-04-29: 145 ug/kg/min via INTRAVENOUS

## 2016-04-29 MED ORDER — MIDAZOLAM HCL 2 MG/2ML IJ SOLN
INTRAMUSCULAR | Status: DC | PRN
  Administered 2016-04-29: 2 mg via INTRAVENOUS

## 2016-04-29 MED ORDER — PROPOFOL 500 MG/50ML IV EMUL
INTRAVENOUS | Status: AC
Start: 2016-04-29 — End: 2016-04-29
  Filled 2016-04-29: qty 50

## 2016-04-29 MED ORDER — LIDOCAINE HCL (CARDIAC) 20 MG/ML IV SOLN
INTRAVENOUS | Status: DC | PRN
  Administered 2016-04-29: 30 mg via INTRAVENOUS

## 2016-04-29 NOTE — Op Note (Signed)
[facility] Gastroenterology Patient Name: Charles Marks Procedure Date: 04/29/2016 2:15 PM MRN: [id number] Account #: [id number] Date of Birth: 1955/05/25 Admit Type: Outpatient Age: 61 Room: [facility] ENDO ROOM 1 Gender: Male Note Status: Finalized Procedure:            Colonoscopy Indications:          High risk colon cancer surveillance: Personal history                        of colonic polyps Providers:            Manya Silvas, MD Medicines:            Propofol per Anesthesia Complications:        No immediate complications. Procedure:            Pre-Anesthesia Assessment:                       - After reviewing the risks and benefits, the patient                        was deemed in satisfactory condition to undergo the                        procedure.                       After obtaining informed consent, the colonoscope was                        passed under direct vision. Throughout the procedure,                        the patient's blood pressure, pulse, and oxygen                        saturations were monitored continuously. The                        Colonoscope was introduced through the anus and                        advanced to the the cecum, identified by appendiceal                        orifice and ileocecal valve. The colonoscopy was                        performed without difficulty. The patient tolerated the                        procedure well. The quality of the bowel preparation                        was excellent. Findings:      Internal hemorrhoids were found during endoscopy. The hemorrhoids were       small, medium-sized and Grade I (internal hemorrhoids that do not       prolapse).      A patchy area of mildly erythematous mucosa was found in the rectum.      The exam was otherwise without abnormality. Impression:           -  Internal hemorrhoids.                       - Erythematous mucosa in the rectum.         - The examination was otherwise normal.                       - No specimens collected. Recommendation:       - Repeat colonoscopy in 5 years for surveillance. Manya Silvas, MD 04/29/2016 2:42:24 PM This report has been signed electronically. Number of Addenda: 0 Note Initiated On: 04/29/2016 2:15 PM Scope Withdrawal Time: 0 hours 7 minutes 55 seconds  Total Procedure Duration: 0 hours 19 minutes 1 second       [facility]

## 2016-04-29 NOTE — Anesthesia Post-op Follow-up Note (Cosign Needed)
Anesthesia QCDR form completed.        

## 2016-04-29 NOTE — Anesthesia Preprocedure Evaluation (Signed)
Anesthesia Evaluation  Patient identified by MRN, date of birth, ID band Patient awake    Reviewed: Allergy & Precautions, NPO status , Patient's Chart, lab work & pertinent test results  History of Anesthesia Complications Negative for: history of anesthetic complications  Airway Mallampati: I  TM Distance: >3 FB Neck ROM: Full    Dental no notable dental hx.    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)  (-) Cardiac Stents and (-) CABG  Rhythm:Regular Rate:Normal - Systolic murmurs and - Diastolic murmurs    Neuro/Psych  Headaches, PSYCHIATRIC DISORDERS Depression    GI/Hepatic Neg liver ROS, GERD  ,  Endo/Other  neg diabetes  Renal/GU negative Renal ROS     Musculoskeletal negative musculoskeletal ROS (+)   Abdominal (+) - obese,   Peds  Hematology negative hematology ROS (+)   Anesthesia Other Findings Past Medical History: No date: Depression No date: Frequency of urination     Comment: since radiation tx No date: GERD (gastroesophageal reflux disease) No date: Headache No date: History of traumatic head injury     Comment: age 26--  head injury w/ fractured skull in               coma for 3 days made full recovery withou               further treatment--  no residual No date: Nocturia urologist-  dr ottelin/  oncologist- dr [name]: Prostate cancer [facility])     Comment: T1c, Gleason 4+4,  PSA 14.24,  vol 25.6cc/  IM              radiation therapy (05-23-2015 to 06-27-2015)   05-23-2015  to  06-27-2015: S/P radiation therapy     Comment: prostatic fossa  68.4 Gy in 38 fractions in               1.8 Gy No date: Type 2 diabetes, diet controlled ([place]) No date: Ulcer ([place]) No date: Wears glasses   Reproductive/Obstetrics                              Anesthesia Physical Anesthesia Plan  ASA: II  Anesthesia Plan: General   Post-op Pain Management:    Induction: Intravenous  Airway Management Planned: Natural Airway  Additional Equipment:   Intra-op Plan:   Post-operative Plan:   Informed Consent: I have reviewed the patients History and Physical, chart, labs and discussed the procedure including the risks, benefits and alternatives for the proposed anesthesia with the patient or authorized representative who has indicated his/her understanding and acceptance.   Dental advisory given  Plan Discussed with: CRNA and Anesthesiologist  Anesthesia Plan Comments:         Anesthesia Quick Evaluation

## 2016-04-29 NOTE — H&P (Signed)
Primary Care Physician:  Maryland Pink, MD Primary Gastroenterologist:  Dr. Vira Agar  Pre-Procedure History & Physical: HPI:  Charles Marks is a 61 y.o. male is here for an colonoscopy.   Past Medical History:  Diagnosis Date  . Depression   . Frequency of urination    since radiation tx  . GERD (gastroesophageal reflux disease)   . Headache   . History of traumatic head injury    age 21--  head injury w/ fractured skull in coma for 3 days made full recovery withou further treatment--  no residual  . Nocturia   . Prostate cancer [facility]) urologist-  dr ottelin/  oncologist- dr [name]   T1c, Gleason 4+4,  PSA 14.24,  vol 25.6cc/  IM radiation therapy (05-23-2015 to 06-27-2015)    . S/P radiation therapy 05-23-2015  to  06-27-2015   prostatic fossa  68.4 Gy in 38 fractions in 1.8 Gy  . Type 2 diabetes, diet controlled ([place])   . Ulcer ([place])   . Wears glasses     Past Surgical History:  Procedure Laterality Date  . APPENDECTOMY  1975  . COLONOSCOPY  2012 approx  . CYSTOSCOPY N/A 07/18/2015   Procedure: CYSTOSCOPY FLEXIBLE;  Surgeon: Kathie Rhodes, MD;  Location: [facility];  Service: Urology;  Laterality: N/A;  no seeds found in bladder  . PROSTATE BIOPSY    . RADIOACTIVE SEED IMPLANT N/A 07/18/2015   Procedure: RADIOACTIVE SEED IMPLANT/BRACHYTHERAPY IMPLANT;  Surgeon: Kathie Rhodes, MD;  Location: [facility];  Service: Urology;  Laterality: N/A;     48  seeds implanted    Prior to Admission medications   Medication Sig Start Date End Date Taking? Authorizing Provider  Cholecalciferol (VITAMIN D3) 1000 units CAPS Take 1 capsule by mouth daily.    Yes Historical Provider, MD  Cyanocobalamin (VITAMIN B-12) 5000 MCG SUBL Place under the tongue.   Yes Historical Provider, MD  Multiple Vitamin (MULTI-VITAMINS) TABS Take 1 tablet by mouth daily.    Yes Historical Provider, MD  omeprazole (PRILOSEC) 40 MG capsule  07/24/15  Yes Historical Provider, MD   QUEtiapine Fumarate (SEROQUEL XR) 150 MG 24 hr tablet Take 150 mg by mouth at bedtime.  01/06/15  Yes Historical Provider, MD  sertraline (ZOLOFT) 100 MG tablet  07/28/15  Yes Historical Provider, MD  SUMAtriptan (IMITREX) 100 MG tablet  06/20/15  Yes Historical Provider, MD  ferrous sulfate 325 (65 FE) MG tablet Take 325 mg by mouth daily.     Historical Provider, MD  HYDROcodone-acetaminophen (NORCO) 10-325 MG tablet Take 1-2 tablets by mouth every 4 (four) hours as needed for moderate pain. Maximum dose per 24 hours - 8 pills Patient not taking: Reported on 08/11/2015 07/18/15   Kathie Rhodes, MD  loperamide (IMODIUM) 2 MG capsule Take 2 mg by mouth as needed for diarrhea or loose stools.    Historical Provider, MD  silodosin (RAPAFLO) 8 MG CAPS capsule Take 8 mg by mouth daily with breakfast.    Historical Provider, MD  tiZANidine (ZANAFLEX) 4 MG capsule Take 4 mg by mouth at bedtime as needed for muscle spasms.    Historical Provider, MD  vitamin C (ASCORBIC ACID) 500 MG tablet Take 500 mg by mouth daily.     Historical Provider, MD    Allergies as of 03/14/2016  . (No Known Allergies)    Family History  Problem Relation Age of Onset  . Cancer Mother     brain tumor  . Multiple  sclerosis Mother   . Diabetes Sister   . Cancer Sister     lung    Social History   Social History  . Marital status: Married    Spouse name: N/A  . Number of children: N/A  . Years of education: N/A   Occupational History  . Not on file.   Social History Main Topics  . Smoking status: Never Smoker  . Smokeless tobacco: Never Used  . Alcohol use Yes     Comment: occasional beer/wine   . Drug use: No  . Sexual activity: Yes     Comment: Vasectomy   Other Topics Concern  . Not on file   Social History Narrative  . No narrative on file    Review of Systems: See HPI, otherwise negative ROS  Physical Exam: BP (!) 125/57   Pulse 67   Temp 97.1 F (36.2 C) (Tympanic)   Resp 18   Ht 6\' 3"   (1.905 m)   Wt 84.8 kg (187 lb)   SpO2 100%   BMI 23.37 kg/m  General:   Alert,  pleasant and cooperative in NAD Head:  Normocephalic and atraumatic. Neck:  Supple; no masses or thyromegaly. Lungs:  Clear throughout to auscultation.    Heart:  Regular rate and rhythm. Abdomen:  Soft, nontender and nondistended. Normal bowel sounds, without guarding, and without rebound.   Neurologic:  Alert and  oriented x4;  grossly normal neurologically.  Impression/Plan: Charles Marks is here for an colonoscopy to be performed for [facility] colon polyps.  Risks, benefits, limitations, and alternatives regarding  colonoscopy have been reviewed with the patient.  Questions have been answered.  All parties agreeable.   Gaylyn Cheers, MD  04/29/2016, 2:10 PM

## 2016-04-29 NOTE — Transfer of Care (Signed)
Immediate Anesthesia Transfer of Care Note  Patient: Charles Marks  Procedure(s) Performed: Procedure(s): COLONOSCOPY WITH PROPOFOL (N/A)  Patient Location: PACU  Anesthesia Type:General  Level of Consciousness: sedated  Airway & Oxygen Therapy: Patient Spontanous Breathing and Patient connected to nasal cannula oxygen  Post-op Assessment: Report given to RN and Post -op Vital signs reviewed and stable  Post vital signs: Reviewed and stable  Last Vitals:  Vitals:   04/29/16 1256 04/29/16 1443  BP: (!) 125/57 100/66  Pulse: 67 (!) 58  Resp: 18 20  Temp: 36.2 C 36.4 C    Last Pain:  Vitals:   04/29/16 1443  TempSrc: Tympanic  PainSc: Asleep         Complications: No apparent anesthesia complications

## 2016-04-29 NOTE — Anesthesia Procedure Notes (Signed)
Date/Time: 04/29/2016 2:13 PM Performed by: Johnna Acosta Pre-anesthesia Checklist: Patient identified, Emergency Drugs available, Suction available, Patient being monitored and Timeout performed Patient Re-evaluated:Patient Re-evaluated prior to inductionOxygen Delivery Method: Nasal cannula

## 2016-04-30 ENCOUNTER — Encounter: Payer: Self-pay | Admitting: Unknown Physician Specialty

## 2016-04-30 NOTE — Anesthesia Postprocedure Evaluation (Signed)
Anesthesia Post Note  Patient: Charles Marks  Procedure(s) Performed: Procedure(s) (LRB): COLONOSCOPY WITH PROPOFOL (N/A)  Patient location during evaluation: Endoscopy Anesthesia Type: General Level of consciousness: awake and alert and oriented Pain management: pain level controlled Vital Signs Assessment: post-procedure vital signs reviewed and stable Respiratory status: spontaneous breathing, nonlabored ventilation and respiratory function stable Cardiovascular status: blood pressure returned to baseline and stable Postop Assessment: no signs of nausea or vomiting Anesthetic complications: no     Last Vitals:  Vitals:   04/29/16 1503 04/29/16 1513  BP: 120/76 124/71  Pulse: (!) 56 (!) 56  Resp: 14 14  Temp:      Last Pain:  Vitals:   04/30/16 0744  TempSrc:   PainSc: 0-No pain                 Cordarius Benning

## 2016-05-07 ENCOUNTER — Other Ambulatory Visit (HOSPITAL_COMMUNITY): Payer: Self-pay | Admitting: Orthopedic Surgery

## 2016-05-07 DIAGNOSIS — Z8546 Personal history of malignant neoplasm of prostate: Secondary | ICD-10-CM

## 2016-05-07 DIAGNOSIS — M25552 Pain in left hip: Secondary | ICD-10-CM

## 2016-05-07 DIAGNOSIS — S32512A Fracture of superior rim of left pubis, initial encounter for closed fracture: Secondary | ICD-10-CM

## 2016-05-16 ENCOUNTER — Ambulatory Visit: Payer: Federal, State, Local not specified - PPO

## 2016-07-29 DIAGNOSIS — R519 Headache, unspecified: Secondary | ICD-10-CM | POA: Insufficient documentation

## 2019-05-19 DIAGNOSIS — G479 Sleep disorder, unspecified: Secondary | ICD-10-CM | POA: Insufficient documentation

## 2019-05-21 ENCOUNTER — Other Ambulatory Visit: Payer: Self-pay | Admitting: Neurology

## 2019-05-21 ENCOUNTER — Other Ambulatory Visit (HOSPITAL_COMMUNITY): Payer: Self-pay | Admitting: Neurology

## 2019-05-21 DIAGNOSIS — R519 Headache, unspecified: Secondary | ICD-10-CM

## 2019-05-24 ENCOUNTER — Other Ambulatory Visit: Payer: Self-pay

## 2019-05-24 ENCOUNTER — Ambulatory Visit
Admission: RE | Admit: 2019-05-24 | Discharge: 2019-05-24 | Disposition: A | Discharge status: Home / Self Care | Payer: Federal, State, Local not specified - PPO | Source: Ambulatory Visit | Attending: Neurology | Admitting: Neurology

## 2019-05-24 DIAGNOSIS — R519 Headache, unspecified: Secondary | ICD-10-CM | POA: Insufficient documentation

## 2019-05-24 IMAGING — MR MR HEAD W/O CM
10 series · 45 of 48 positions shown · non-contrast
Comparison: None.

CLINICAL DATA: Migraines

EXAM:
MRI HEAD WITHOUT CONTRAST
TECHNIQUE: Multiplanar, multiecho pulse sequences of the brain and surrounding
structures were obtained without intravenous contrast.

[Series 5: ax dwi_tracew · axial · 3.0mm · 0.60mm/px · z∈[-59,+102]mm · 5 of 50 slices shown]
[im 1/50]
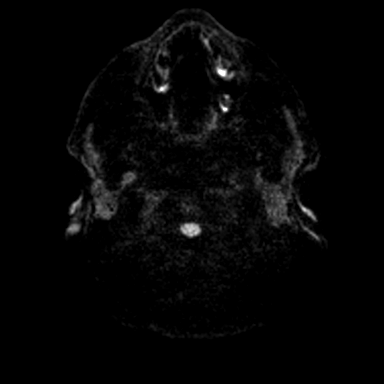
[im 13/50]
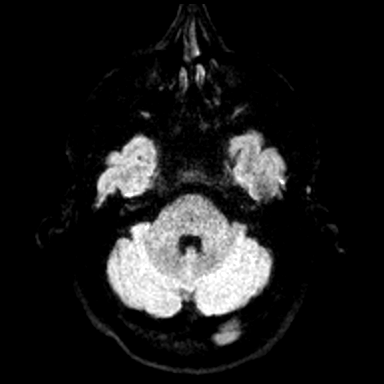
[im 25/50]
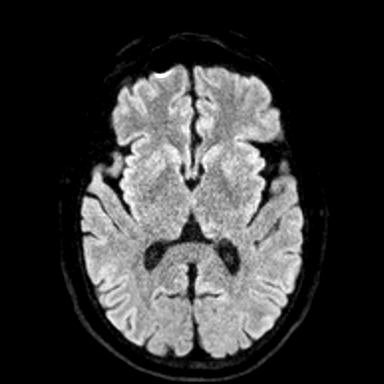
[im 37/50]
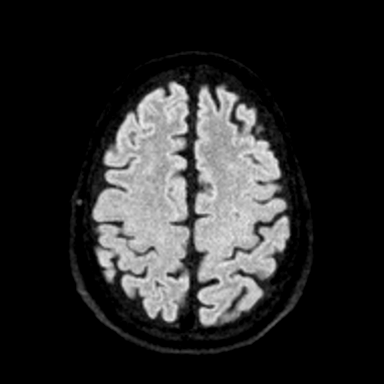
[im 50/50]
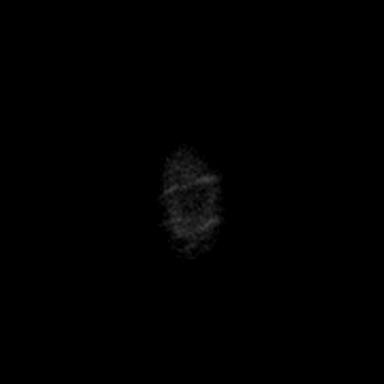

[Series 6: ax dwi_adc · axial · 3.0mm · 0.60mm/px · z∈[-59,+102]mm · 4 of 50 slices shown]
[im 1/50]
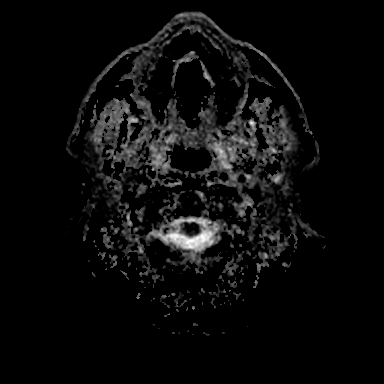
[im 17/50]
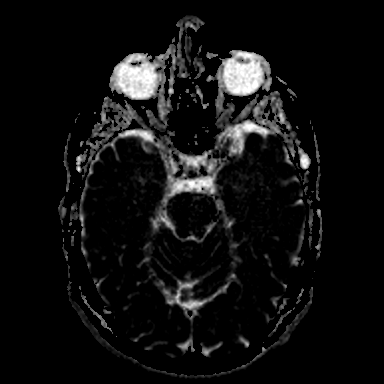
[im 33/50]
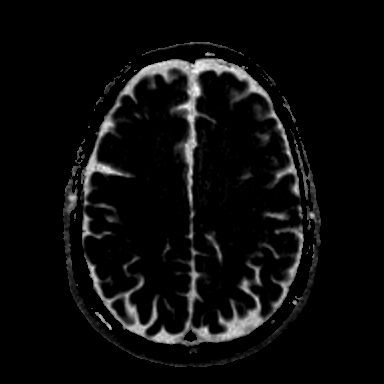
[im 50/50]
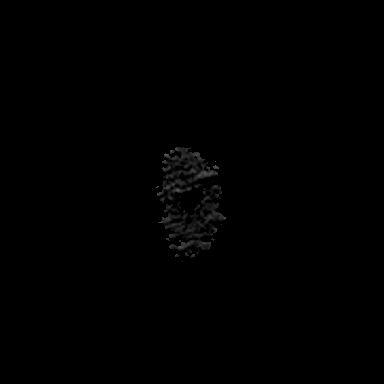

[Series 8: cor dwi_tracew · coronal · 5.0mm · 0.60mm/px · 3 of 42 slices shown]
[im 1/42]
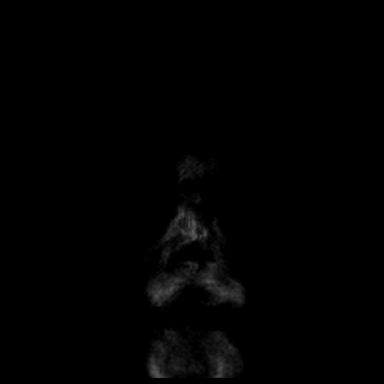
[im 21/42]
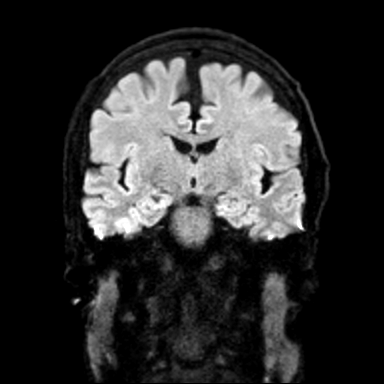
[im 42/42]
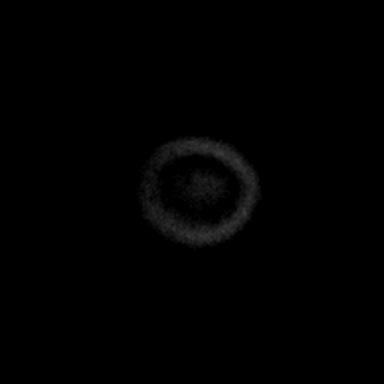

[Series 9: cor dwi_adc · coronal · 5.0mm · 0.60mm/px · 3 of 42 slices shown]
[im 1/42]
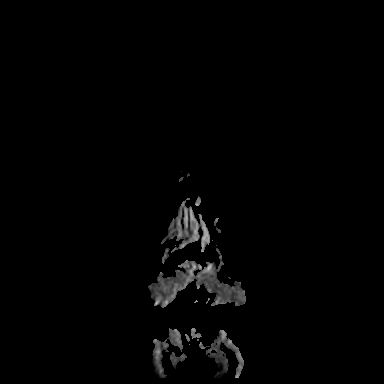
[im 21/42]
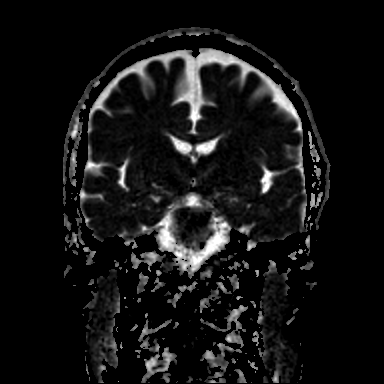
[im 42/42]
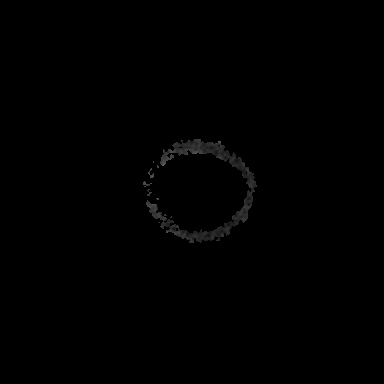

[Series 10: T1 · sagittal · 5.0mm · 0.62mm/px · 2 of 24 slices shown (1 of 2)]
[im 1/24]
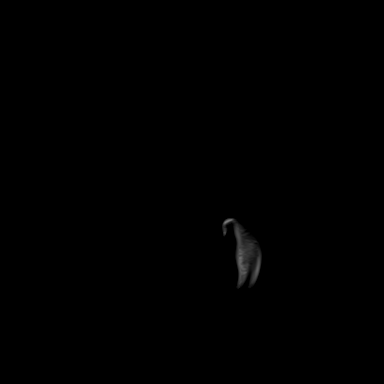
[im 24/24]
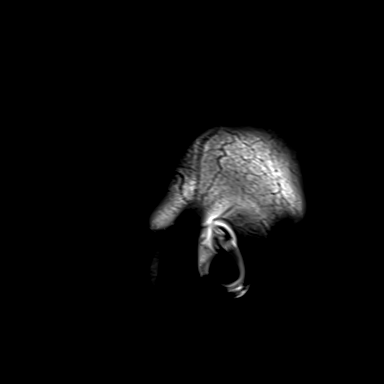

[Series 12: pha_images · axial · 3.0mm · 0.90mm/px · z∈[-64,+103]mm · 5 of 57 slices shown]
[im 1/57]
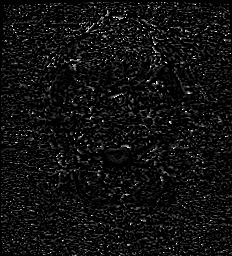
[im 15/57]
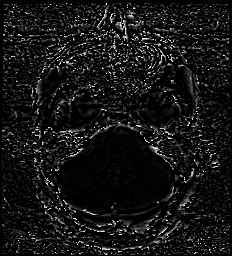
[im 29/57]
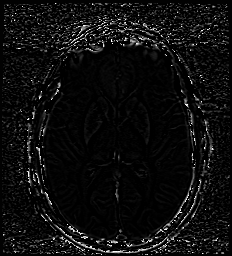
[im 43/57]
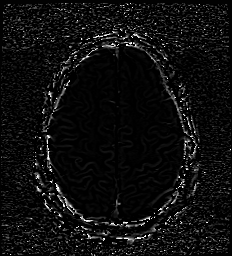
[im 57/57]
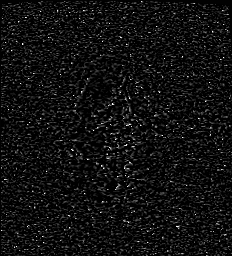

[Series 13: swi_images · axial · 3.0mm · 0.90mm/px · z∈[-67,+109]mm · 5 of 60 slices shown]
[im 1/60]
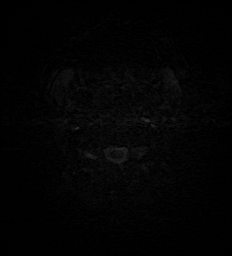
[im 15/60]
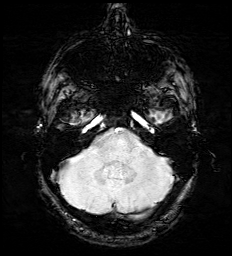
[im 30/60]
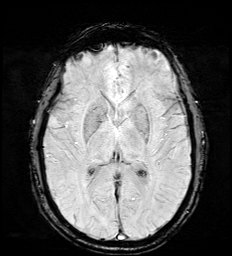
[im 45/60]
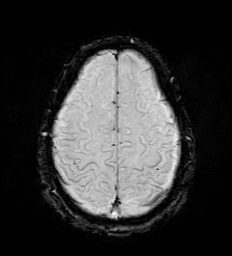
[im 60/60]
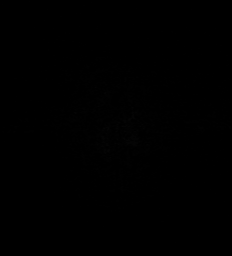

[Series 15: FLAIR · axial · 3.0mm · 0.55mm/px · z∈[-59,+102]mm · 4 of 55 slices shown]
[im 1/55]
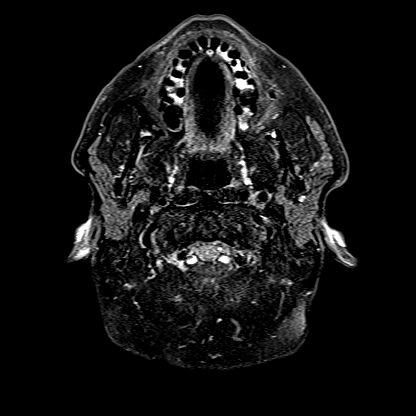
[im 19/55]
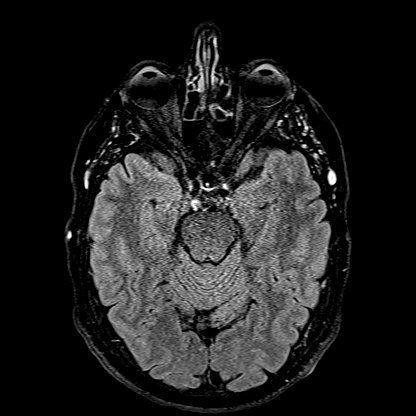
[im 37/55]
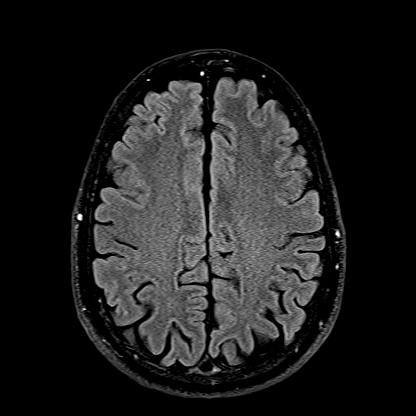
[im 55/55]
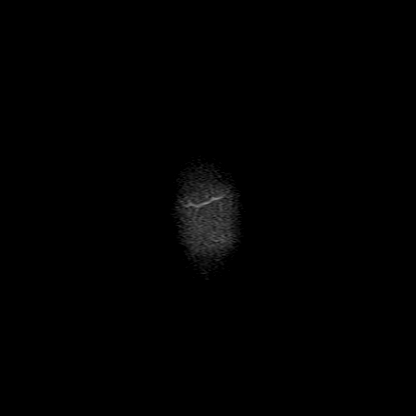

[Series 16: T1 · axial · 1.0mm · 0.98mm/px · z∈[-66,+108]mm · 11 of 176 slices shown (2 of 2)]
[im 1/176]
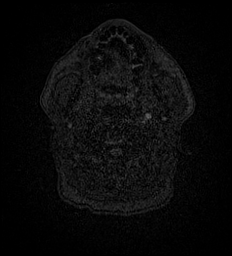
[im 14/176]
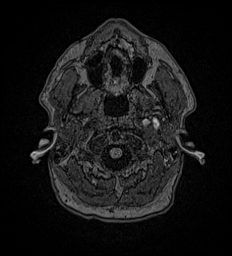
[im 27/176]
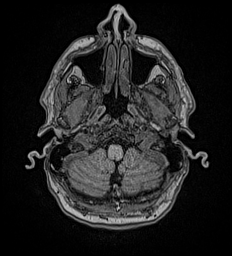
[im 41/176]
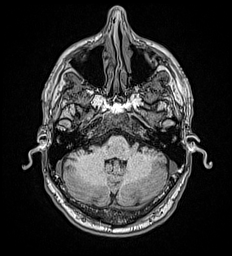
[im 54/176]
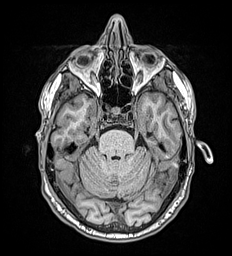
[im 68/176]
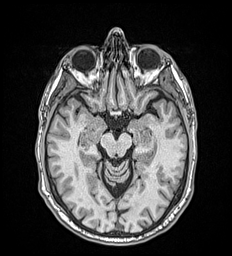
[im 81/176]
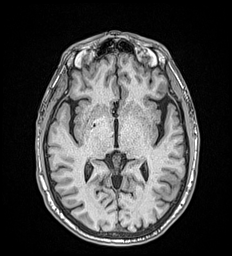
[im 95/176]
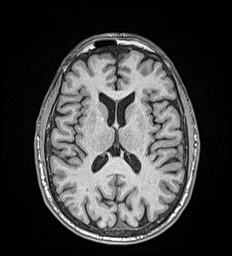
[im 122/176]
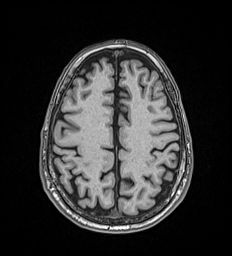
[im 149/176]
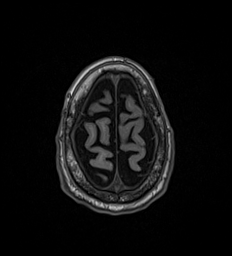
[im 176/176]
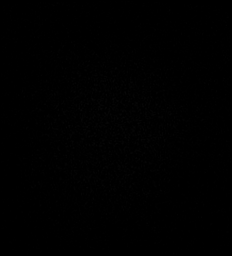

[Series 17: T2 · coronal · 5.0mm · 0.57mm/px · 3 of 32 slices shown]
[im 1/32]
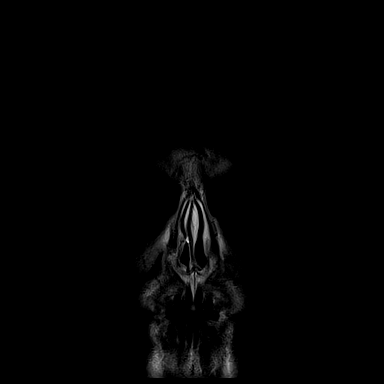
[im 16/32]
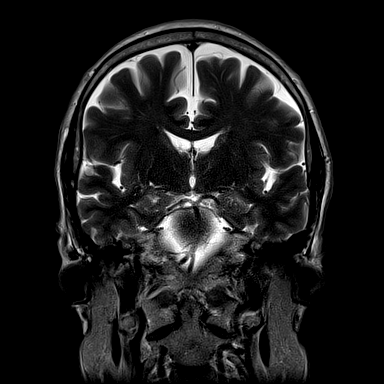
[im 32/32]
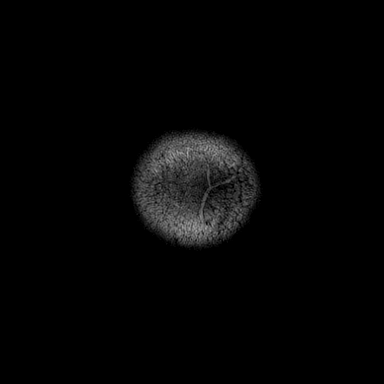

[45 of 48 positions shown; findings below may reference images not displayed]

FINDINGS: BRAIN: No acute infarct, acute hemorrhage or extra-axial collection.
Scattered foci of hyperintense T2-weighted signal within the white
matter in a nonspecific pattern that may be seen in the context of
migraine headaches, but is also seen in asymptomatic patients.
Normal volume of brain parenchyma and CSF spaces. Midline structures
are normal.

VASCULAR: Major flow voids are preserved. Susceptibility-sensitive
sequences show no chronic microhemorrhage or superficial siderosis.

SKULL AND UPPER CERVICAL SPINE: Normal calvarium and skull base.
Visualized upper cervical spine and soft tissues are normal.

SINUSES/ORBITS: No paranasal sinus fluid levels or advanced mucosal
thickening. No mastoid or middle ear effusion. Normal orbits.
IMPRESSION: Normal aging brain.

## 2019-09-15 ENCOUNTER — Ambulatory Visit: Payer: Federal, State, Local not specified - PPO | Admitting: Dermatology

## 2020-05-23 ENCOUNTER — Other Ambulatory Visit: Payer: Self-pay | Admitting: Family Medicine

## 2020-05-23 DIAGNOSIS — I7090 Unspecified atherosclerosis: Secondary | ICD-10-CM

## 2020-05-24 ENCOUNTER — Ambulatory Visit
Admission: RE | Admit: 2020-05-24 | Discharge: 2020-05-24 | Disposition: A | Discharge status: Home / Self Care | Payer: Federal, State, Local not specified - PPO | Source: Ambulatory Visit | Attending: Family Medicine | Admitting: Family Medicine

## 2020-05-24 ENCOUNTER — Other Ambulatory Visit: Payer: Self-pay

## 2020-05-24 DIAGNOSIS — I7090 Unspecified atherosclerosis: Secondary | ICD-10-CM | POA: Diagnosis present

## 2020-06-23 ENCOUNTER — Other Ambulatory Visit: Payer: Self-pay | Admitting: Family Medicine

## 2020-06-23 DIAGNOSIS — M542 Cervicalgia: Secondary | ICD-10-CM

## 2020-07-06 ENCOUNTER — Ambulatory Visit
Admission: RE | Admit: 2020-07-06 | Discharge: 2020-07-06 | Disposition: A | Discharge status: Home / Self Care | Payer: Federal, State, Local not specified - PPO | Source: Ambulatory Visit | Attending: Family Medicine | Admitting: Family Medicine

## 2020-07-06 DIAGNOSIS — M542 Cervicalgia: Secondary | ICD-10-CM | POA: Diagnosis not present

## 2020-07-06 IMAGING — MR MR CERVICAL SPINE W/O CM
5 series · 37 of 48 positions shown · non-contrast
Comparison: Report from radiographs of the cervical spine
[DATE] (images unavailable).

CLINICAL DATA: Cervicalgia. Additional history provided by
technologist: Patient reports 3 months of progressively worsening
bilateral neck pain. Left arm/hand tingling.

EXAM:
MRI CERVICAL SPINE WITHOUT CONTRAST
TECHNIQUE: Multiplanar, multisequence MR imaging of the cervical spine was
performed. No intravenous contrast was administered.

[Series 5: T2 · sagittal · 3.0mm · 0.62mm/px · 7 of 15 slices shown (1 of 2)]
[im 1/15]
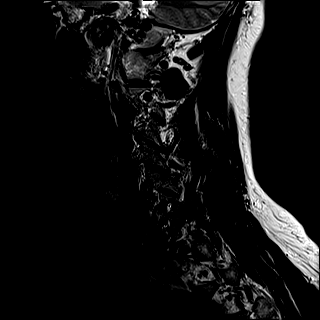
[im 3/15]
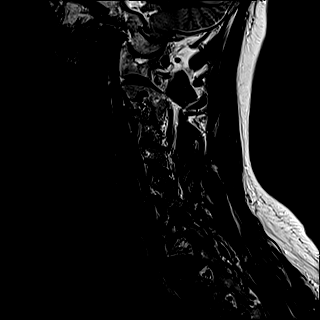
[im 5/15]
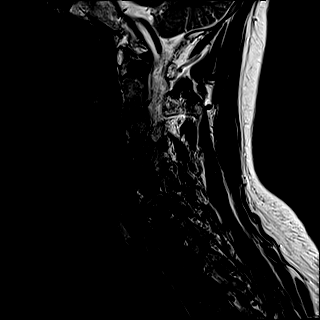
[im 8/15]
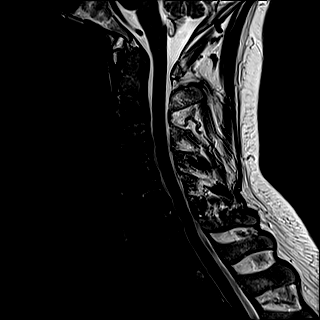
[im 10/15]
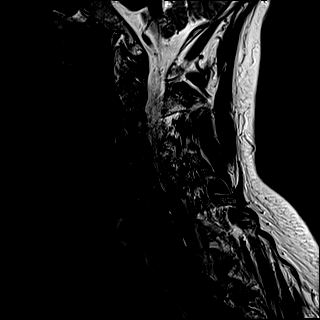
[im 12/15]
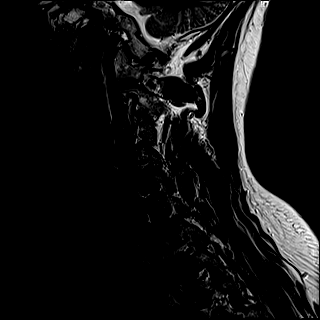
[im 15/15]
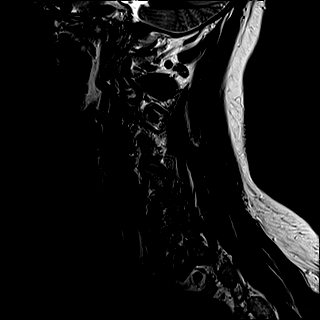

[Series 6: FLAIR · sagittal · 3.0mm · 0.78mm/px · 7 of 15 slices shown]
[im 1/15]
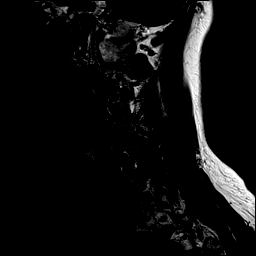
[im 3/15]
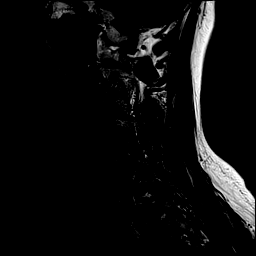
[im 5/15]
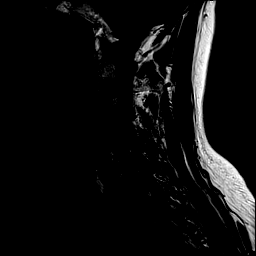
[im 8/15]
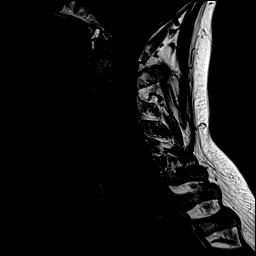
[im 10/15]
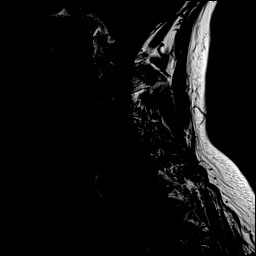
[im 12/15]
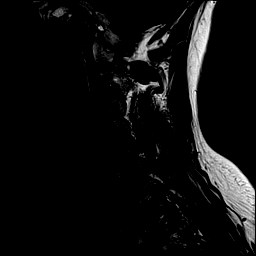
[im 15/15]
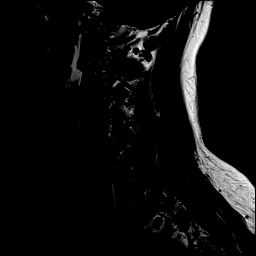

[Series 7: STIR · sagittal · 3.0mm · 0.62mm/px · 7 of 15 slices shown]
[im 1/15]
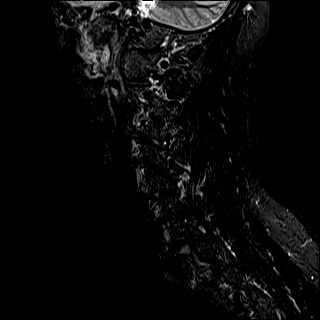
[im 3/15]
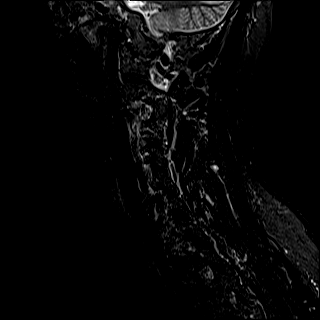
[im 5/15]
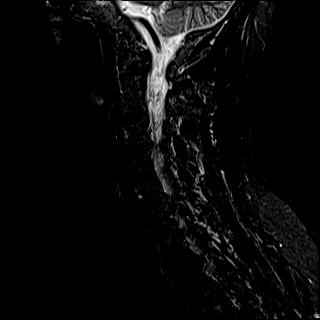
[im 8/15]
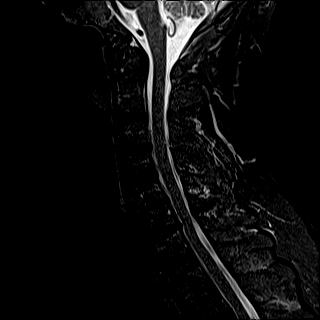
[im 10/15]
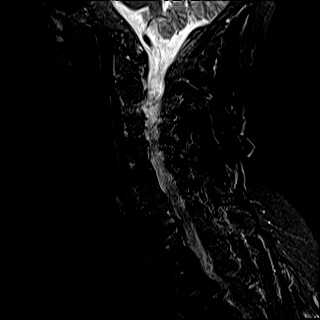
[im 12/15]
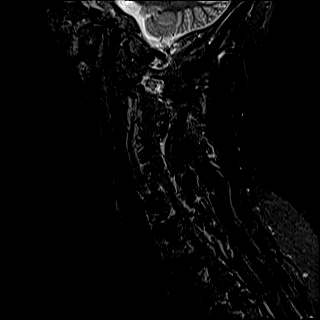
[im 15/15]
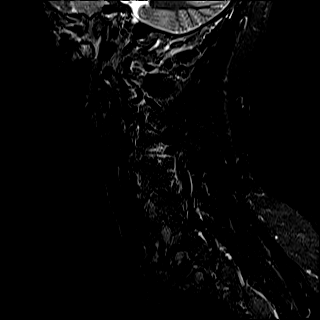

[Series 8: T2 · axial · 3.0mm · 0.70mm/px · z∈[-103,+6]mm · 9 of 25 slices shown (2 of 2)]
[im 1/25]
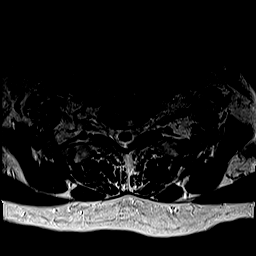
[im 5/25]
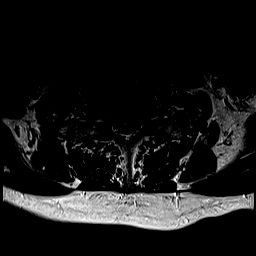
[im 7/25]
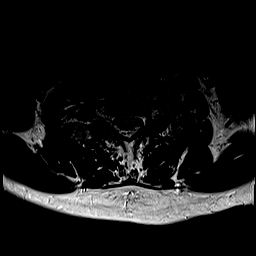
[im 11/25]
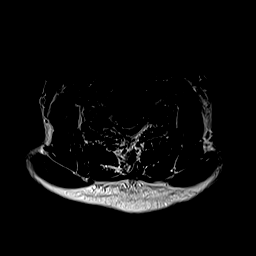
[im 14/25]
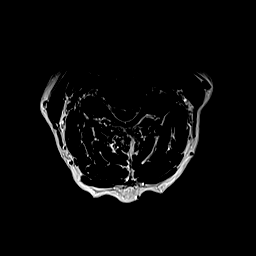
[im 18/25]
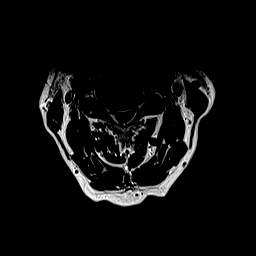
[im 20/25]
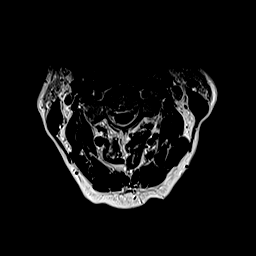
[im 22/25]
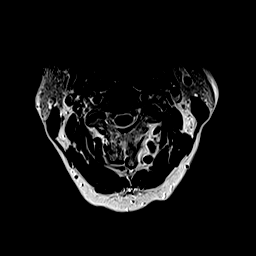
[im 25/25]
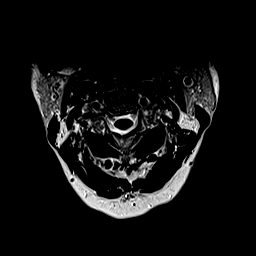

[Series 9: ax mpgr · axial · 3.0mm · 0.35mm/px · z∈[-103,-11]mm · 7 of 33 slices shown]
[im 1/33]
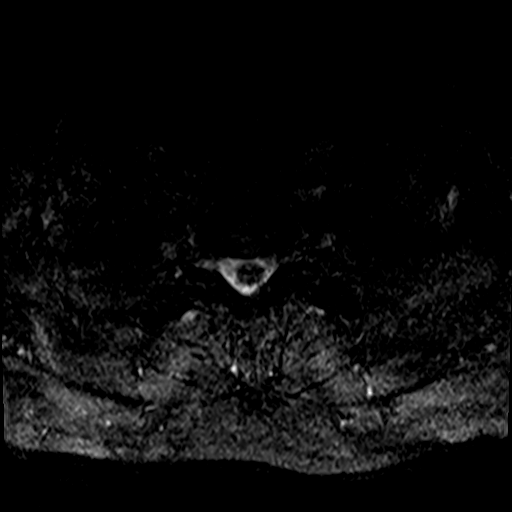
[im 5/33]
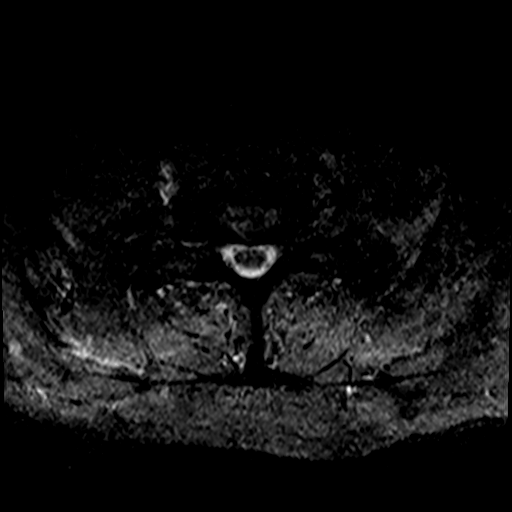
[im 10/33]
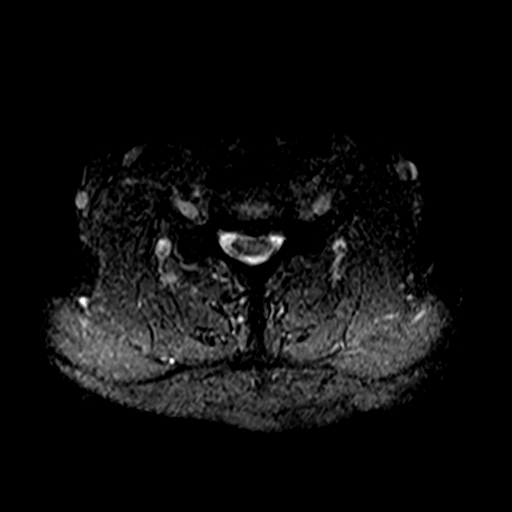
[im 14/33]
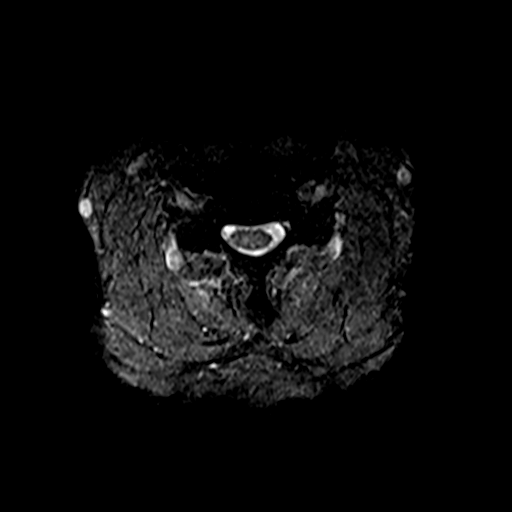
[im 19/33]
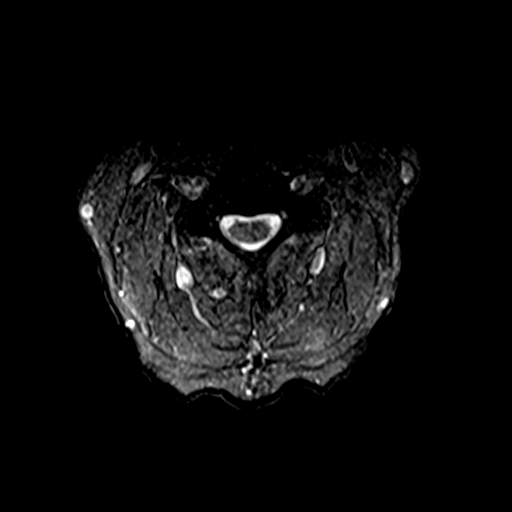
[im 23/33]
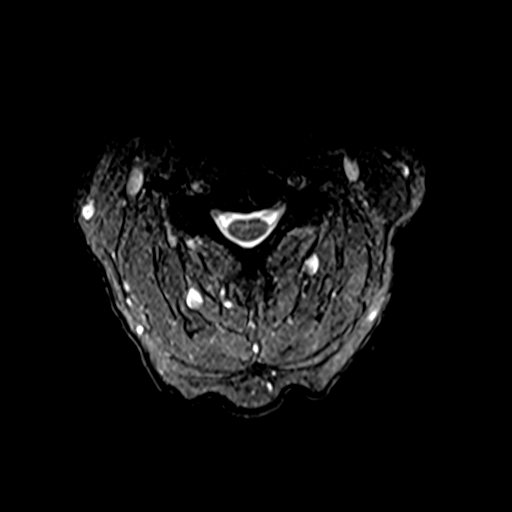
[im 28/33]
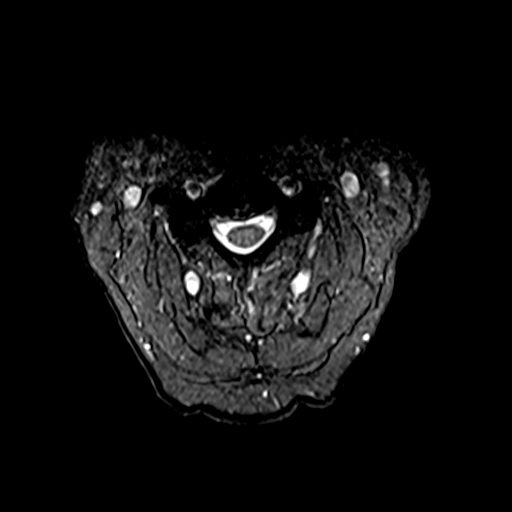

[37 of 48 positions shown; findings below may reference images not displayed]

FINDINGS: Mild intermittent motion degradation.

Alignment: Straightening of the expected cervical lordosis. Trace
C4-C5 grade 1 anterolisthesis.

Vertebrae: Vertebral body height is maintained. Trace degenerative
edema within the right C3 articular pillar (series 7, image 1).
Trace degenerative endplate edema at C6-C7. Elsewhere, no
significant marrow edema or focal suspicious osseous lesion is
identified.

Cord: No spinal cord signal abnormality is identified.

Posterior Fossa, vertebral arteries, paraspinal tissues: No
abnormality identified within included portions of the posterior
fossa. Flow voids preserved within the imaged cervical vertebral
arteries. Paraspinal soft tissues within normal limits.

Disc levels:

Advanced disc degeneration at C5-C6. Mild disc degeneration at the
remaining levels.

C2-C3: Shallow broad-based central disc protrusion. Minimal facet
arthrosis. No significant spinal canal or foraminal narrowing.

C3-C4: Disc bulge with superimposed small central disc protrusion.
Right greater than left disc osteophyte ridge/uncinate hypertrophy.
Facet arthrosis. Mild spinal canal narrowing without spinal cord
mass effect. Mild/moderate right neural foraminal narrowing.

C4-C5: Trace grade 1 anterolisthesis. Small central disc protrusion.
Mild uncovertebral hypertrophy on the left. Facet arthrosis (greater
on the left). The disc protrusion mildly effaces the ventral thecal
sac without spinal cord mass effect. Bilateral neural foraminal
narrowing (mild right, moderate left).

C5-C6: Disc bulge with endplate spurring and bilateral disc
osteophyte ridge/uncinate hypertrophy. Facet arthrosis. Minimal
relative spinal canal narrowing without spinal cord mass effect.
Moderate to moderately advanced bilateral neural foraminal
narrowing.

C6-C7: Disc bulge with left-sided disc osteophyte ridge/uncinate
hypertrophy. Facet arthrosis and ligamentum flavum hypertrophy. Mild
relative spinal canal narrowing. The disc bulge contacts and mildly
flattens the ventral spinal cord. Bilateral neural foraminal
narrowing (mild right, moderate/advanced left).

C7-T1: Mild facet arthrosis. No significant disc herniation or
stenosis.
IMPRESSION: Cervical spondylosis, as outlined. Levels of mild spinal canal
stenosis. Most notably at C6-C7, a disc bulge contributes to mild
spinal canal stenosis, contacting and minimally flattening the
ventral spinal cord.

Multilevel neural foraminal narrowing, greatest on the left at C4-C5
(moderate), bilaterally at C5-C6 (moderate to moderately advanced)
and on the left at C6-C7 (moderate/advanced).

Notably, disc degeneration is advanced at C5-C6.

Mild degenerative endplate edema at C6-C7. Mild degenerative edema
is also present within the right C3 articular pillar.

## 2020-07-28 ENCOUNTER — Other Ambulatory Visit: Payer: Self-pay

## 2020-07-28 ENCOUNTER — Inpatient Hospital Stay
Admission: EM | Admit: 2020-07-28 | Discharge: 2020-07-29 | DRG: 025 | Disposition: A | Discharge status: Other Acute Inpatient Hospital | Payer: Medicare Other | Attending: Neurosurgery | Admitting: Neurosurgery

## 2020-07-28 ENCOUNTER — Encounter: Payer: Self-pay | Admitting: Emergency Medicine

## 2020-07-28 DIAGNOSIS — Z20822 Contact with and (suspected) exposure to covid-19: Secondary | ICD-10-CM | POA: Diagnosis present

## 2020-07-28 DIAGNOSIS — F32A Depression, unspecified: Secondary | ICD-10-CM | POA: Diagnosis present

## 2020-07-28 DIAGNOSIS — R4189 Other symptoms and signs involving cognitive functions and awareness: Secondary | ICD-10-CM | POA: Diagnosis present

## 2020-07-28 DIAGNOSIS — W1839XA Other fall on same level, initial encounter: Secondary | ICD-10-CM | POA: Diagnosis present

## 2020-07-28 DIAGNOSIS — S0291XA Unspecified fracture of skull, initial encounter for closed fracture: Secondary | ICD-10-CM

## 2020-07-28 DIAGNOSIS — S065X9A Traumatic subdural hemorrhage with loss of consciousness of unspecified duration, initial encounter: Secondary | ICD-10-CM | POA: Diagnosis not present

## 2020-07-28 DIAGNOSIS — E119 Type 2 diabetes mellitus without complications: Secondary | ICD-10-CM | POA: Diagnosis present

## 2020-07-28 DIAGNOSIS — S065XAA Traumatic subdural hemorrhage with loss of consciousness status unknown, initial encounter: Secondary | ICD-10-CM | POA: Diagnosis present

## 2020-07-28 DIAGNOSIS — H5704 Mydriasis: Secondary | ICD-10-CM | POA: Diagnosis present

## 2020-07-28 DIAGNOSIS — Z923 Personal history of irradiation: Secondary | ICD-10-CM

## 2020-07-28 DIAGNOSIS — Z833 Family history of diabetes mellitus: Secondary | ICD-10-CM

## 2020-07-28 DIAGNOSIS — G8194 Hemiplegia, unspecified affecting left nondominant side: Secondary | ICD-10-CM | POA: Diagnosis present

## 2020-07-28 DIAGNOSIS — G935 Compression of brain: Secondary | ICD-10-CM | POA: Diagnosis present

## 2020-07-28 DIAGNOSIS — Z8546 Personal history of malignant neoplasm of prostate: Secondary | ICD-10-CM

## 2020-07-28 DIAGNOSIS — Z66 Do not resuscitate: Secondary | ICD-10-CM | POA: Diagnosis present

## 2020-07-28 DIAGNOSIS — W19XXXA Unspecified fall, initial encounter: Secondary | ICD-10-CM

## 2020-07-28 DIAGNOSIS — Z820 Family history of epilepsy and other diseases of the nervous system: Secondary | ICD-10-CM

## 2020-07-28 DIAGNOSIS — K219 Gastro-esophageal reflux disease without esophagitis: Secondary | ICD-10-CM | POA: Diagnosis present

## 2020-07-28 DIAGNOSIS — Y92002 Bathroom of unspecified non-institutional (private) residence single-family (private) house as the place of occurrence of the external cause: Secondary | ICD-10-CM

## 2020-07-28 IMAGING — CT CT CERVICAL SPINE W/O CM
3 of 4 series · 13 of 33 positions shown, 16 images · non-contrast
Comparison: Prior radiograph from [DATE].

CLINICAL DATA: Initial evaluation for acute trauma, fall. Prior
head

EXAM:
CT CERVICAL SPINE WITHOUT CONTRAST
TECHNIQUE: Multidetector CT imaging of the cervical spine was performed without
intravenous contrast. Multiplanar CT image reconstructions were also
generated.

[Series 5: sagittal bone · sagittal · 0.40mm/px · 5 of 61 slices shown, 6 images]
[im 21/61  bone]
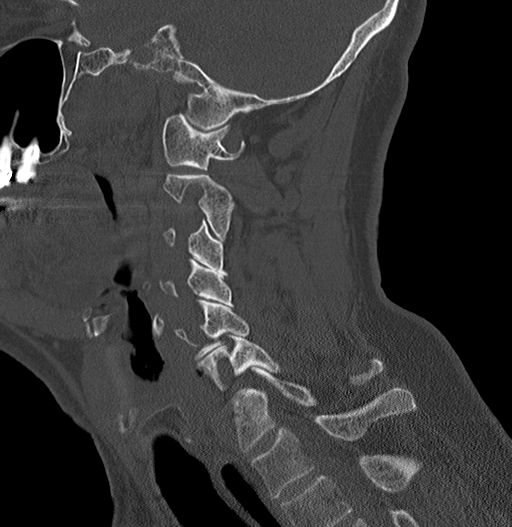
[im 26/61  bone]
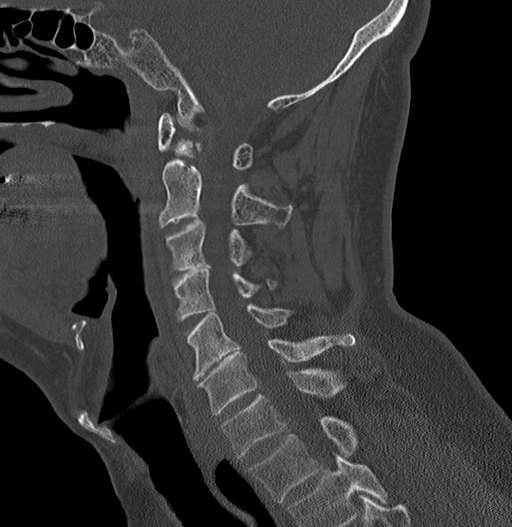
[im 31/61  soft-tissue]
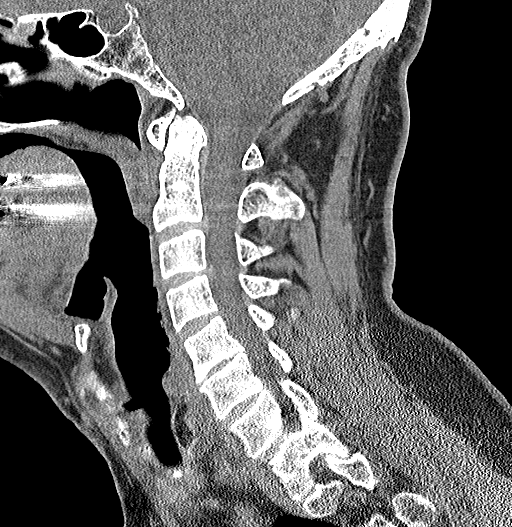
[im 31/61  bone]
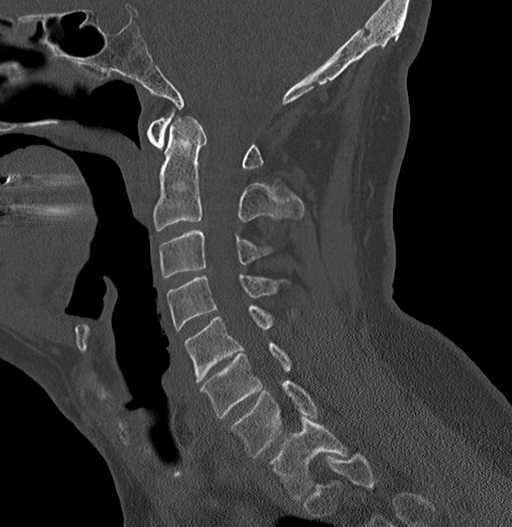
[im 36/61  bone]
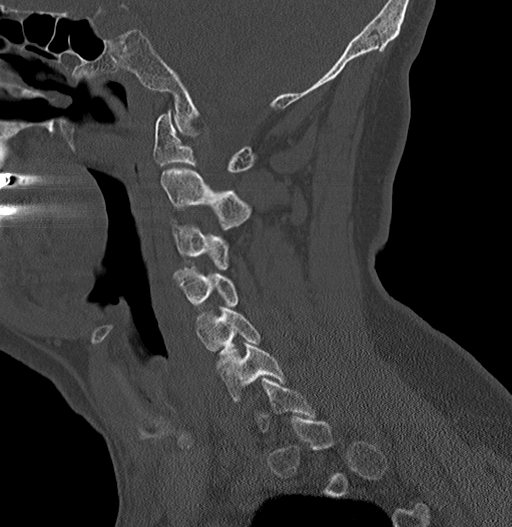
[im 41/61  bone]
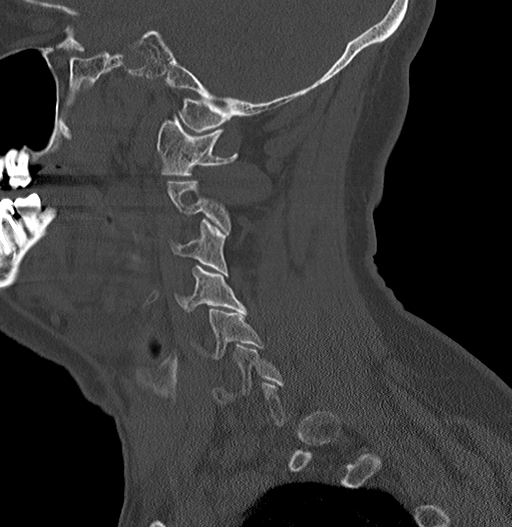

[Series 6: coronal bone · coronal · 0.32mm/px · 3 of 71 slices shown]
[im 15/71  bone]
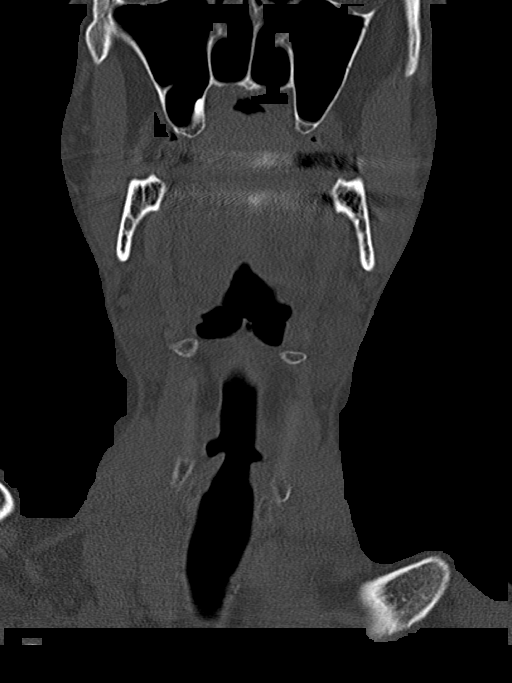
[im 29/71  bone]
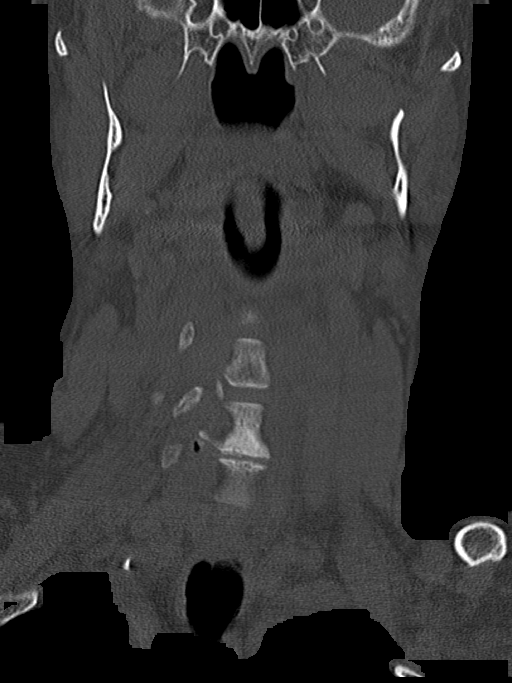
[im 43/71  bone]
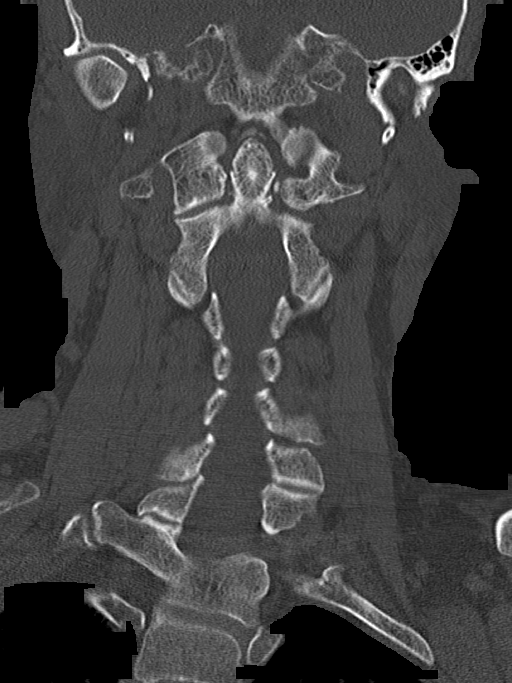

[Series 7: orthogonal bone · axial · 0.23mm/px · z∈[-305,-161]mm · 5 of 122 slices shown, 7 images]
[im 21/122  soft-tissue]
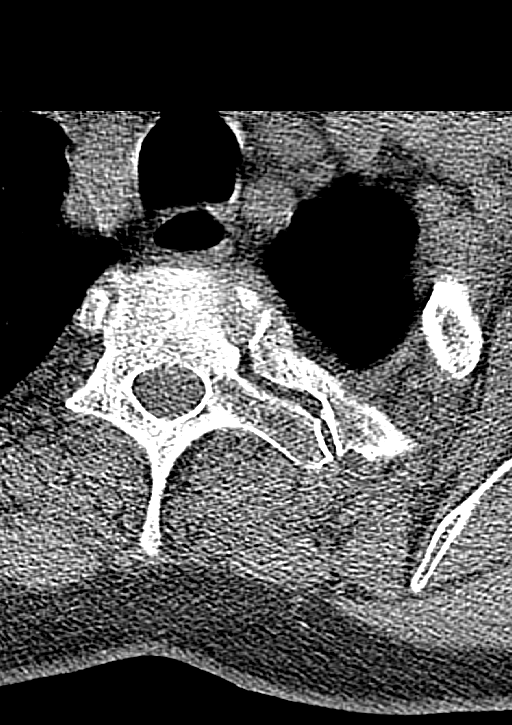
[im 21/122  bone]
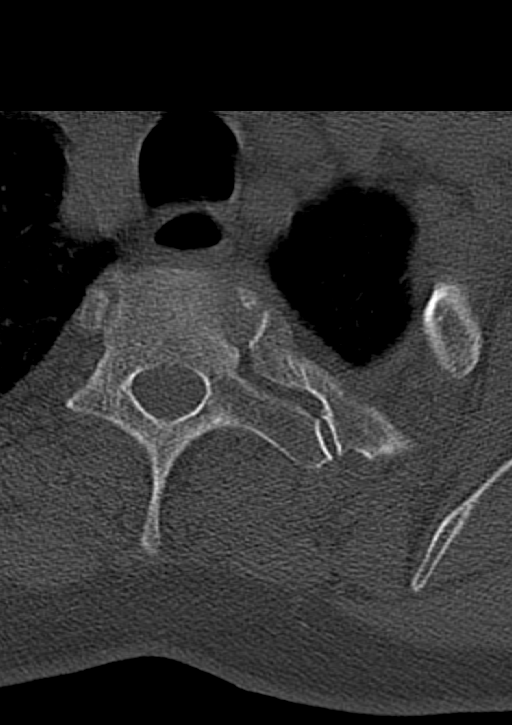
[im 41/122  bone]
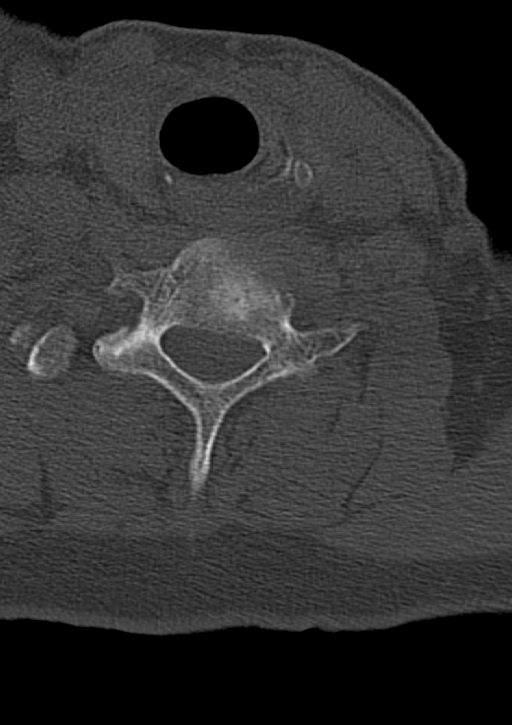
[im 61/122  bone]
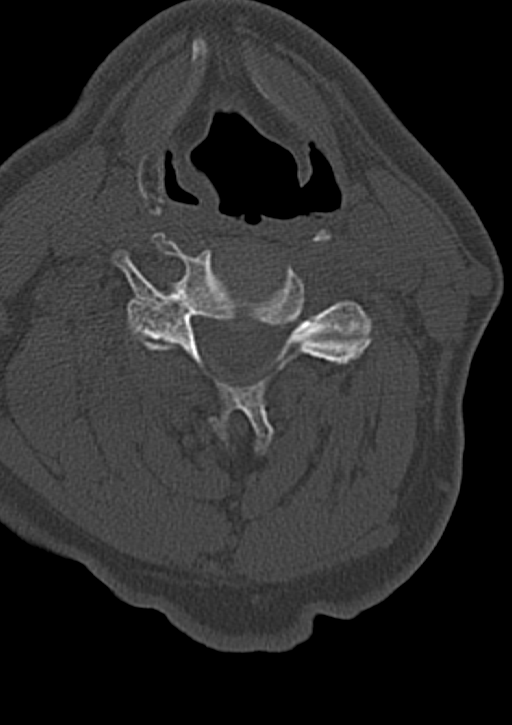
[im 81/122  bone]
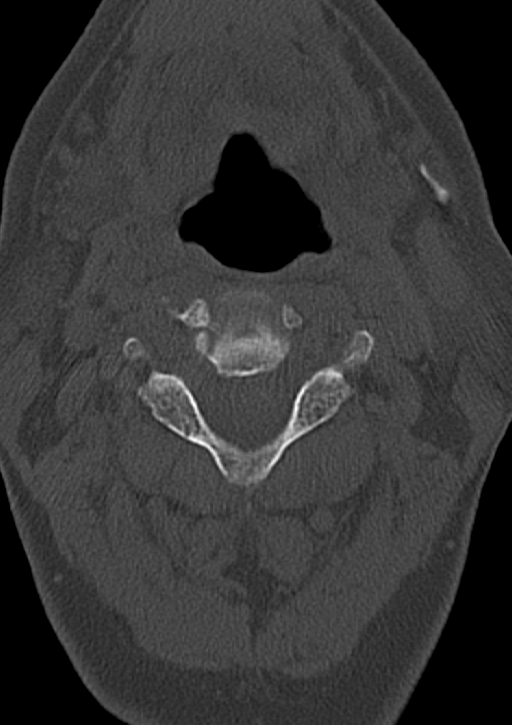
[im 101/122  soft-tissue]
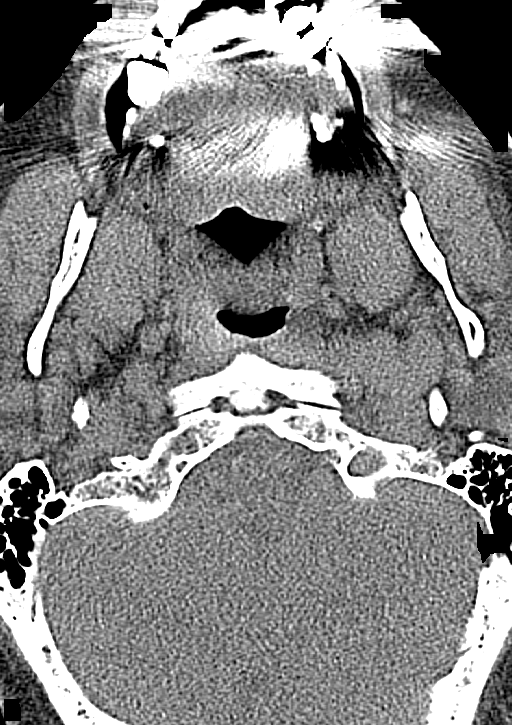
[im 101/122  bone]
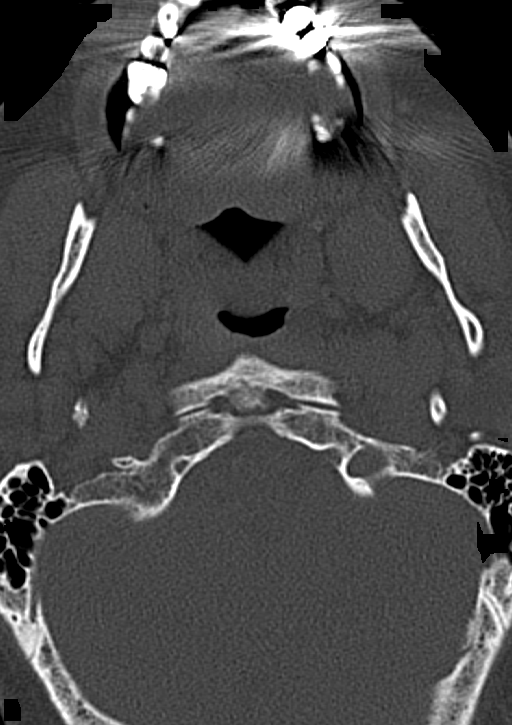

[13 of 33 positions shown; findings below may reference images not displayed]

FINDINGS: Alignment: Vertebral bodies normally aligned with preservation of
the normal cervical lordosis. No listhesis or malalignment.

Skull base and vertebrae: Skull base intact. Normal C1-2
articulations are preserved in the dens is intact. Vertebral body
heights maintained. No acute fracture.

Soft tissues and spinal canal: Soft tissues of the neck demonstrate
no acute finding. No abnormal prevertebral edema. Spinal canal
within normal limits.

Disc levels: Moderate cervical spondylosis noted at C5-6 without
significant spinal stenosis.

Upper chest: Visualized upper chest demonstrates no acute finding.
Partially visualized lung apices are clear.

Other: None.
IMPRESSION: 1. No acute traumatic injury within the cervical spine.
2. Moderate cervical spondylosis at C5-6.

## 2020-07-28 MED ORDER — ATROPINE SULFATE 1 MG/10ML IJ SOSY
PREFILLED_SYRINGE | INTRAMUSCULAR | Status: AC
  Administered 2020-07-28: 1 mg via INTRAVENOUS
  Filled 2020-07-28: qty 10

## 2020-07-28 NOTE — ED Triage Notes (Addendum)
Pt to ED from home with wife c/o unwitnessed fall tonight.  Wife states heard a thud in bathroom around 2115 tonight.  Wife states pt was acting normal prior to fall, after fall was confused per wife and trouble walking.  Pt was helped out of car upon arrival and patient was unable to help, leaning to the right, and slow to respond.  Pt taken back to room 11, pt unable to help transfer from wheelchair to bed at this time, not answering questions, right pupil more dilated than left.  ED provider to bedside, code stroke called.  Wife states patient has advanced directives at home, states pt is DNR, DNI, does not want shocks delivered.

## 2020-07-29 ENCOUNTER — Emergency Department: Payer: Medicare Other

## 2020-07-29 ENCOUNTER — Inpatient Hospital Stay: Payer: Medicare Other | Admitting: Anesthesiology

## 2020-07-29 ENCOUNTER — Encounter
Admission: EM | Disposition: A | Discharge status: Other Acute Inpatient Hospital | Payer: Self-pay | Source: Home / Self Care | Attending: Internal Medicine

## 2020-07-29 DIAGNOSIS — Z820 Family history of epilepsy and other diseases of the nervous system: Secondary | ICD-10-CM | POA: Diagnosis not present

## 2020-07-29 DIAGNOSIS — Z66 Do not resuscitate: Secondary | ICD-10-CM | POA: Diagnosis present

## 2020-07-29 DIAGNOSIS — G935 Compression of brain: Secondary | ICD-10-CM | POA: Diagnosis present

## 2020-07-29 DIAGNOSIS — H5704 Mydriasis: Secondary | ICD-10-CM | POA: Diagnosis present

## 2020-07-29 DIAGNOSIS — W1839XA Other fall on same level, initial encounter: Secondary | ICD-10-CM | POA: Diagnosis present

## 2020-07-29 DIAGNOSIS — S065XAA Traumatic subdural hemorrhage with loss of consciousness status unknown, initial encounter: Secondary | ICD-10-CM

## 2020-07-29 DIAGNOSIS — G8194 Hemiplegia, unspecified affecting left nondominant side: Secondary | ICD-10-CM | POA: Diagnosis present

## 2020-07-29 DIAGNOSIS — Z20822 Contact with and (suspected) exposure to covid-19: Secondary | ICD-10-CM | POA: Diagnosis present

## 2020-07-29 DIAGNOSIS — S065X9A Traumatic subdural hemorrhage with loss of consciousness of unspecified duration, initial encounter: Secondary | ICD-10-CM | POA: Diagnosis present

## 2020-07-29 DIAGNOSIS — Z8546 Personal history of malignant neoplasm of prostate: Secondary | ICD-10-CM | POA: Diagnosis not present

## 2020-07-29 DIAGNOSIS — Z833 Family history of diabetes mellitus: Secondary | ICD-10-CM | POA: Diagnosis not present

## 2020-07-29 DIAGNOSIS — F32A Depression, unspecified: Secondary | ICD-10-CM | POA: Diagnosis present

## 2020-07-29 DIAGNOSIS — Z923 Personal history of irradiation: Secondary | ICD-10-CM | POA: Diagnosis not present

## 2020-07-29 DIAGNOSIS — K219 Gastro-esophageal reflux disease without esophagitis: Secondary | ICD-10-CM | POA: Diagnosis present

## 2020-07-29 DIAGNOSIS — E119 Type 2 diabetes mellitus without complications: Secondary | ICD-10-CM | POA: Diagnosis present

## 2020-07-29 DIAGNOSIS — W19XXXA Unspecified fall, initial encounter: Secondary | ICD-10-CM | POA: Insufficient documentation

## 2020-07-29 DIAGNOSIS — Y92002 Bathroom of unspecified non-institutional (private) residence single-family (private) house as the place of occurrence of the external cause: Secondary | ICD-10-CM | POA: Diagnosis not present

## 2020-07-29 DIAGNOSIS — R4189 Other symptoms and signs involving cognitive functions and awareness: Secondary | ICD-10-CM | POA: Diagnosis present

## 2020-07-29 HISTORY — DX: Traumatic subdural hemorrhage with loss of consciousness status unknown, initial encounter: S06.5XAA

## 2020-07-29 HISTORY — PX: CRANIOTOMY: SHX93

## 2020-07-29 HISTORY — PX: SUBDURAL HEMATOMA EVACUATION VIA CRANIOTOMY: SUR319

## 2020-07-29 HISTORY — DX: Traumatic subdural hemorrhage with loss of consciousness of unspecified duration, initial encounter: S06.5X9A

## 2020-07-29 LAB — URINALYSIS, ROUTINE W REFLEX MICROSCOPIC
Bacteria, UA: NONE SEEN
Bilirubin Urine: NEGATIVE
Glucose, UA: >=500 mg/dL — AB
Hgb urine dipstick: NEGATIVE
Ketones, ur: 20 mg/dL — AB
Leukocytes,Ua: NEGATIVE
Nitrite: NEGATIVE
Protein, ur: 30 mg/dL — AB
Specific Gravity, Urine: 1.014 (ref 1.005–1.030)
Squamous Epithelial / HPF: NONE SEEN (ref 0–5)
pH: 6 (ref 5.0–8.0)

## 2020-07-29 LAB — CBC
HCT: 38.2 % — ABNORMAL LOW (ref 39.0–52.0)
HCT: 42.5 % (ref 39.0–52.0)
Hemoglobin: 13.3 g/dL (ref 13.0–17.0)
Hemoglobin: 14.8 g/dL (ref 13.0–17.0)
MCH: 32.4 pg (ref 26.0–34.0)
MCH: 32.6 pg (ref 26.0–34.0)
MCHC: 34.8 g/dL (ref 30.0–36.0)
MCHC: 34.8 g/dL (ref 30.0–36.0)
MCV: 92.9 fL (ref 80.0–100.0)
MCV: 93.6 fL (ref 80.0–100.0)
Platelets: 149 10*3/uL — ABNORMAL LOW (ref 150–400)
Platelets: 165 10*3/uL (ref 150–400)
RBC: 4.11 MIL/uL — ABNORMAL LOW (ref 4.22–5.81)
RBC: 4.54 MIL/uL (ref 4.22–5.81)
RDW: 13.4 % (ref 11.5–15.5)
RDW: 13.5 % (ref 11.5–15.5)
WBC: 10.6 10*3/uL — ABNORMAL HIGH (ref 4.0–10.5)
WBC: 12.3 10*3/uL — ABNORMAL HIGH (ref 4.0–10.5)
nRBC: 0 % (ref 0.0–0.2)
nRBC: 0 % (ref 0.0–0.2)

## 2020-07-29 LAB — COMPREHENSIVE METABOLIC PANEL
ALT: 19 U/L (ref 0–44)
AST: 30 U/L (ref 15–41)
Albumin: 4.7 g/dL (ref 3.5–5.0)
Alkaline Phosphatase: 43 U/L (ref 38–126)
Anion gap: 10 (ref 5–15)
BUN: 12 mg/dL (ref 8–23)
CO2: 24 mmol/L (ref 22–32)
Calcium: 8.8 mg/dL — ABNORMAL LOW (ref 8.9–10.3)
Chloride: 102 mmol/L (ref 98–111)
Creatinine, Ser: 0.82 mg/dL (ref 0.61–1.24)
GFR, Estimated: >60 mL/min (ref >60)
Glucose, Bld: 214 mg/dL — ABNORMAL HIGH (ref 70–99)
Potassium: 2.9 mmol/L — ABNORMAL LOW (ref 3.5–5.1)
Sodium: 136 mmol/L (ref 135–145)
Total Bilirubin: 1.1 mg/dL (ref 0.3–1.2)
Total Protein: 6.9 g/dL (ref 6.5–8.1)

## 2020-07-29 LAB — CBG MONITORING, ED
Glucose-Capillary: 135 mg/dL — ABNORMAL HIGH (ref 70–99)
Glucose-Capillary: 214 mg/dL — ABNORMAL HIGH (ref 70–99)

## 2020-07-29 LAB — BASIC METABOLIC PANEL
Anion gap: 8 (ref 5–15)
BUN: 10 mg/dL (ref 8–23)
CO2: 24 mmol/L (ref 22–32)
Calcium: 8.6 mg/dL — ABNORMAL LOW (ref 8.9–10.3)
Chloride: 105 mmol/L (ref 98–111)
Creatinine, Ser: 0.72 mg/dL (ref 0.61–1.24)
GFR, Estimated: >60 mL/min (ref >60)
Glucose, Bld: 152 mg/dL — ABNORMAL HIGH (ref 70–99)
Potassium: 3.9 mmol/L (ref 3.5–5.1)
Sodium: 137 mmol/L (ref 135–145)

## 2020-07-29 LAB — DIFFERENTIAL
Abs Immature Granulocytes: 0.08 10*3/uL — ABNORMAL HIGH (ref 0.00–0.07)
Basophils Absolute: 0.1 10*3/uL (ref 0.0–0.1)
Basophils Relative: 0 %
Eosinophils Absolute: 0 10*3/uL (ref 0.0–0.5)
Eosinophils Relative: 0 %
Immature Granulocytes: 1 %
Lymphocytes Relative: 11 %
Lymphs Abs: 1.4 10*3/uL (ref 0.7–4.0)
Monocytes Absolute: 0.5 10*3/uL (ref 0.1–1.0)
Monocytes Relative: 4 %
Neutro Abs: 10.3 10*3/uL — ABNORMAL HIGH (ref 1.7–7.7)
Neutrophils Relative %: 84 %

## 2020-07-29 LAB — ETHANOL: Alcohol, Ethyl (B): <10 mg/dL (ref <10)

## 2020-07-29 LAB — TYPE AND SCREEN
ABO/RH(D): A NEG
Antibody Screen: NEGATIVE

## 2020-07-29 LAB — URINE DRUG SCREEN, QUALITATIVE (ARMC ONLY)
Amphetamines, Ur Screen: NOT DETECTED
Barbiturates, Ur Screen: NOT DETECTED
Benzodiazepine, Ur Scrn: NOT DETECTED
Cannabinoid 50 Ng, Ur ~~LOC~~: NOT DETECTED
Cocaine Metabolite,Ur ~~LOC~~: NOT DETECTED
MDMA (Ecstasy)Ur Screen: NOT DETECTED
Methadone Scn, Ur: NOT DETECTED
Opiate, Ur Screen: NOT DETECTED
Phencyclidine (PCP) Ur S: NOT DETECTED
Tricyclic, Ur Screen: POSITIVE — AB

## 2020-07-29 LAB — TROPONIN I (HIGH SENSITIVITY): Troponin I (High Sensitivity): 4 ng/L (ref <18)

## 2020-07-29 LAB — PROTIME-INR
INR: 1.1 (ref 0.8–1.2)
Prothrombin Time: 13.9 seconds (ref 11.4–15.2)

## 2020-07-29 LAB — RESP PANEL BY RT-PCR (FLU A&B, COVID) ARPGX2
Influenza A by PCR: NEGATIVE
Influenza B by PCR: NEGATIVE
SARS Coronavirus 2 by RT PCR: NEGATIVE

## 2020-07-29 LAB — APTT: aPTT: 24 seconds (ref 24–36)

## 2020-07-29 IMAGING — CT CT ANGIO HEAD-NECK (W OR W/O PERF)
3 of 7 series · 10 of 35 positions shown · IV contrast (APPLIED)
Comparison: Prior CT from earlier the same day.

CLINICAL DATA: Initial evaluation for intracranial hemorrhage.

EXAM:
CT ANGIOGRAPHY HEAD AND NECK
TECHNIQUE: Multidetector CT imaging of the head and neck was performed using
the standard protocol during bolus administration of intravenous
contrast. Multiplanar CT image reconstructions and MIPs were
obtained to evaluate the vascular anatomy. Carotid stenosis
measurements (when applicable) are obtained utilizing NASCET
criteria, using the distal internal carotid diameter as the
denominator.
CONTRAST:  75mL OMNIPAQUE IOHEXOL 350 MG/ML SOLN

[Series 6: cta head neck · axial · 0.50mm/px · z∈[-216,-80]mm · 2 of 206 slices shown]
[im 69/206  soft-tissue]
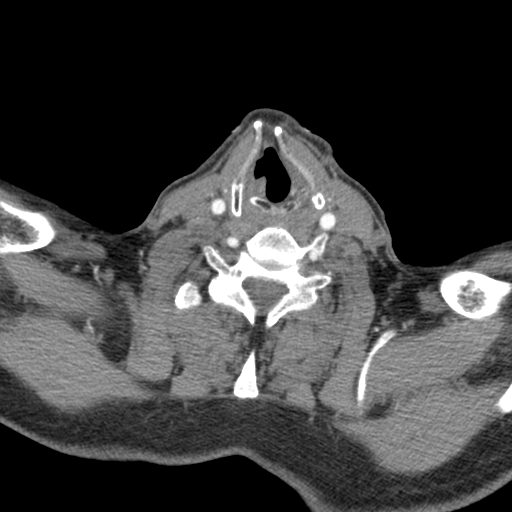
[im 137/206  soft-tissue]
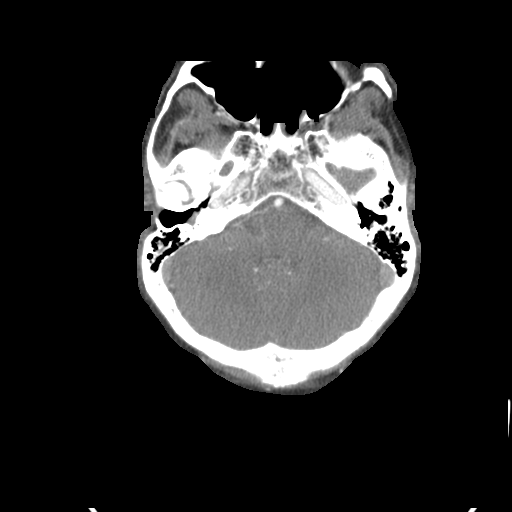

[Series 8: ax thin · axial · 0.55mm/px · z∈[-293,+10]mm · 6 of 419 slices shown]
[im 60/419  soft-tissue]
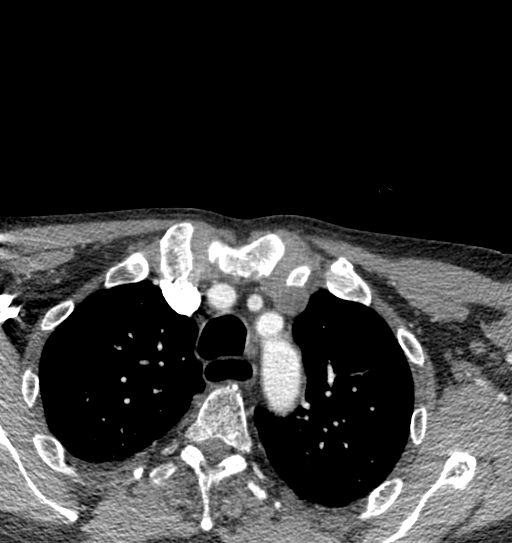
[im 120/419  bone]
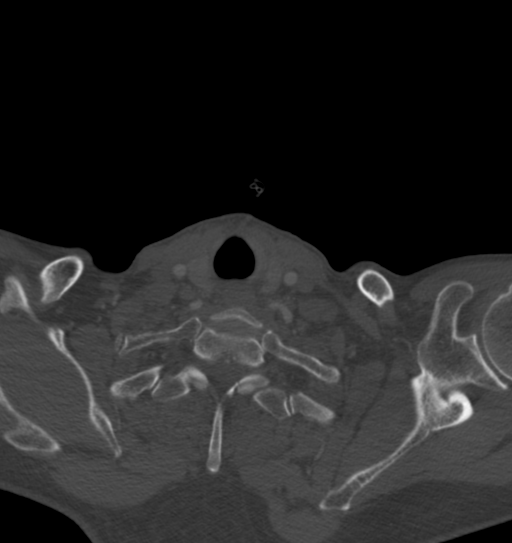
[im 180/419  soft-tissue]
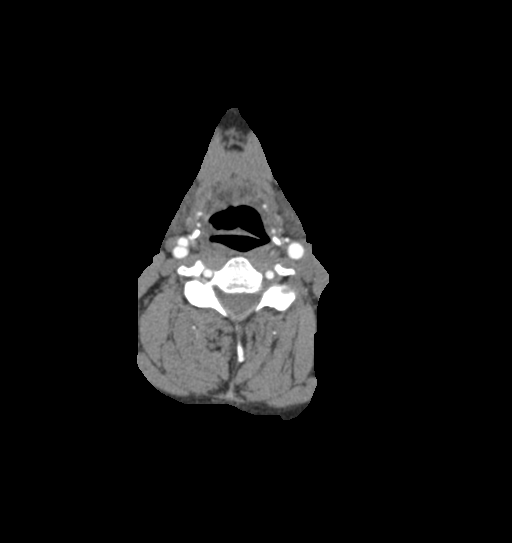
[im 239/419  bone]
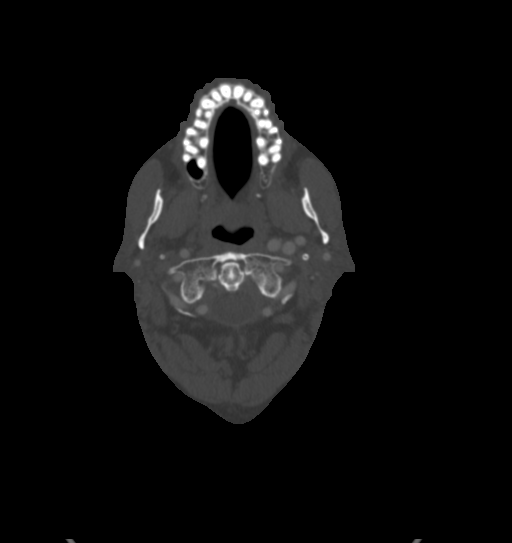
[im 299/419  soft-tissue]
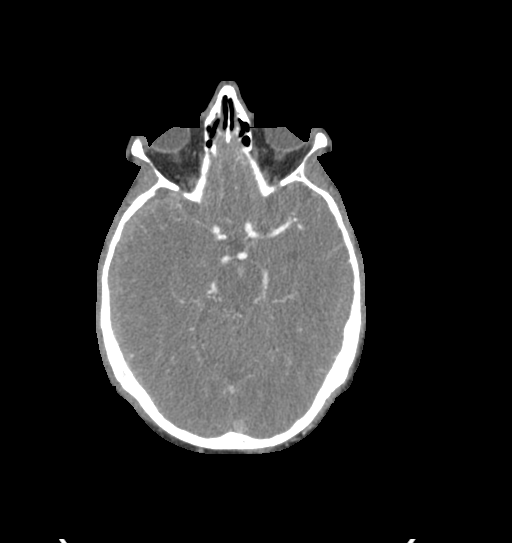
[im 359/419  bone]
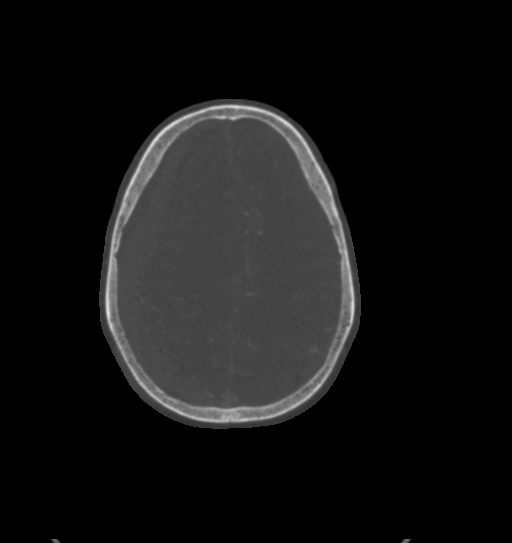

[Series 10: sagittal thin · sagittal · 0.62mm/px · 2 of 274 slices shown]
[im 29/274  soft-tissue]
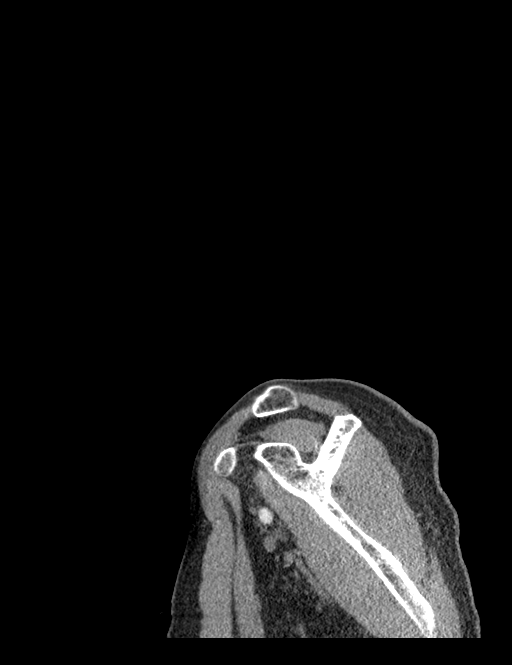
[im 243/274  soft-tissue]
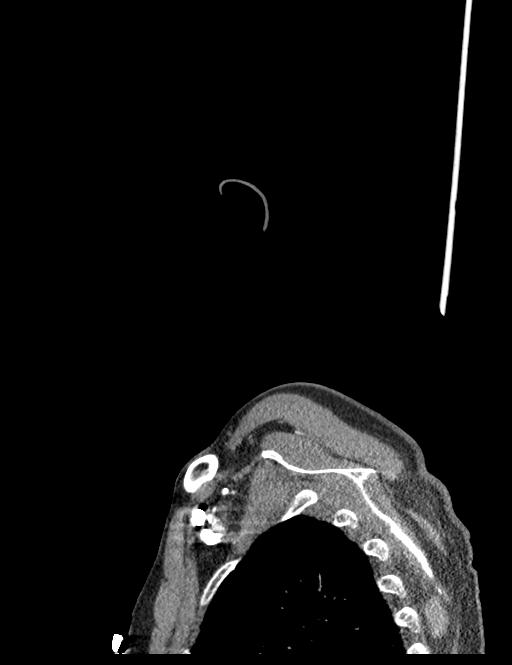

[10 of 35 positions shown; findings below may reference images not displayed]

FINDINGS: CTA NECK FINDINGS

Aortic arch: Visualized aortic arch normal caliber with normal
branch pattern. No stenosis or other abnormality seen about the
origin of the great vessels.

Right carotid system: Right CCA patent from its origin to the
bifurcation without stenosis. Minimal for age atheromatous change
about the right carotid bulb without significant stenosis.
Tortuosity with multifocal irregularity seen involving the mid right
ICA, suggestive of possible underlying FMD. No significant stenosis
or other vascular abnormality. Right ICA remains patent to the skull
base.

Left carotid system: Left common carotid artery patent from its
origin to the bifurcation without stenosis. Minimal atheromatous
plaque about the left carotid bulb without stenosis. Left ICA
tortuous and somewhat ectatic but remains widely patent to the skull
base without stenosis, dissection or occlusion.

Vertebral arteries: Both vertebral arteries arise from the
subclavian arteries. No proximal subclavian artery stenosis.
Atheromatous plaque at the origin of the right vertebral artery with
no more than mild stenosis. Vertebral arteries otherwise patent
distally without stenosis, dissection or occlusion.

Skeleton: No visible acute osseous finding, although better
evaluated on prior CT of the cervical spine. No discrete or
worrisome osseous lesions.

Other neck: No other acute soft tissue abnormality within the neck.
No visible mass or adenopathy. Findings suggestive of right vocal
cord paresis noted.

Upper chest: Mild scattered atelectatic changes noted within the
visualized lungs. Visualized upper chest demonstrates no other acute
finding.

Review of the MIP images confirms the above findings

CTA HEAD FINDINGS

Anterior circulation: Irregular linear filling defect seen involving
the petrous/proximal cavernous right ICA, suspicious for dissection
(series 8, images 292, 296, 299 no frank intraluminal thrombus. No
significant stenosis or pseudoaneurysm. Right ICA patent distally to
the terminus. Left ICA widely patent through the siphon to the
terminus without abnormality. A1 segments patent bilaterally. Right
A1 hypoplastic. Normal anterior communicating artery complex.
Anterior cerebral arteries patent to their distal aspects without
stenosis. No M1 stenosis or occlusion. Normal MCA bifurcations.
Distal MCA branches well perfused and symmetric.

Posterior circulation: Both V4 segments patent to the
vertebrobasilar junction without stenosis. Left PICA origin patent
and normal. Right PICA not seen. Basilar patent to its distal aspect
without stenosis. Superior cerebellar arteries patent bilaterally.
Both PCAs primarily supplied via the basilar well perfused to their
distal aspects.

Venous sinuses: Grossly patent allowing for timing the contrast
bolus. Few scattered foci of pneumocephalus overlie the right
transverse sinus related to the overlying skull fracture.

Anatomic variants: Hypoplastic right A1.  No intracranial aneurysm.

Review of the MIP images confirms the above findings
IMPRESSION: 1. Irregular linear filling defect involving the petrous/proximal
cavernous right ICA, suspicious for dissection, age indeterminate.
No frank intraluminal thrombus, significant stenosis, or
pseudoaneurysm.
2. Otherwise wide patency of the major arterial vasculature of the
head and neck. No large vessel occlusion. No other hemodynamically
significant or correctable stenosis. No intracranial aneurysm.
3. Tortuosity with multifocal irregularity involving the mid right
ICA, suggestive of possible underlying FMD.
4. Findings suggestive of right vocal cord paresis. Correlation with
physical exam suggested.

## 2020-07-29 IMAGING — DX DG CHEST 1V PORT
1 series · 1 of 1 positions shown · non-contrast
Comparison: Radiograph [DATE]

CLINICAL DATA: Altered mental status, unwitnessed fall

EXAM:
PORTABLE CHEST 1 VIEW

[chest ap]
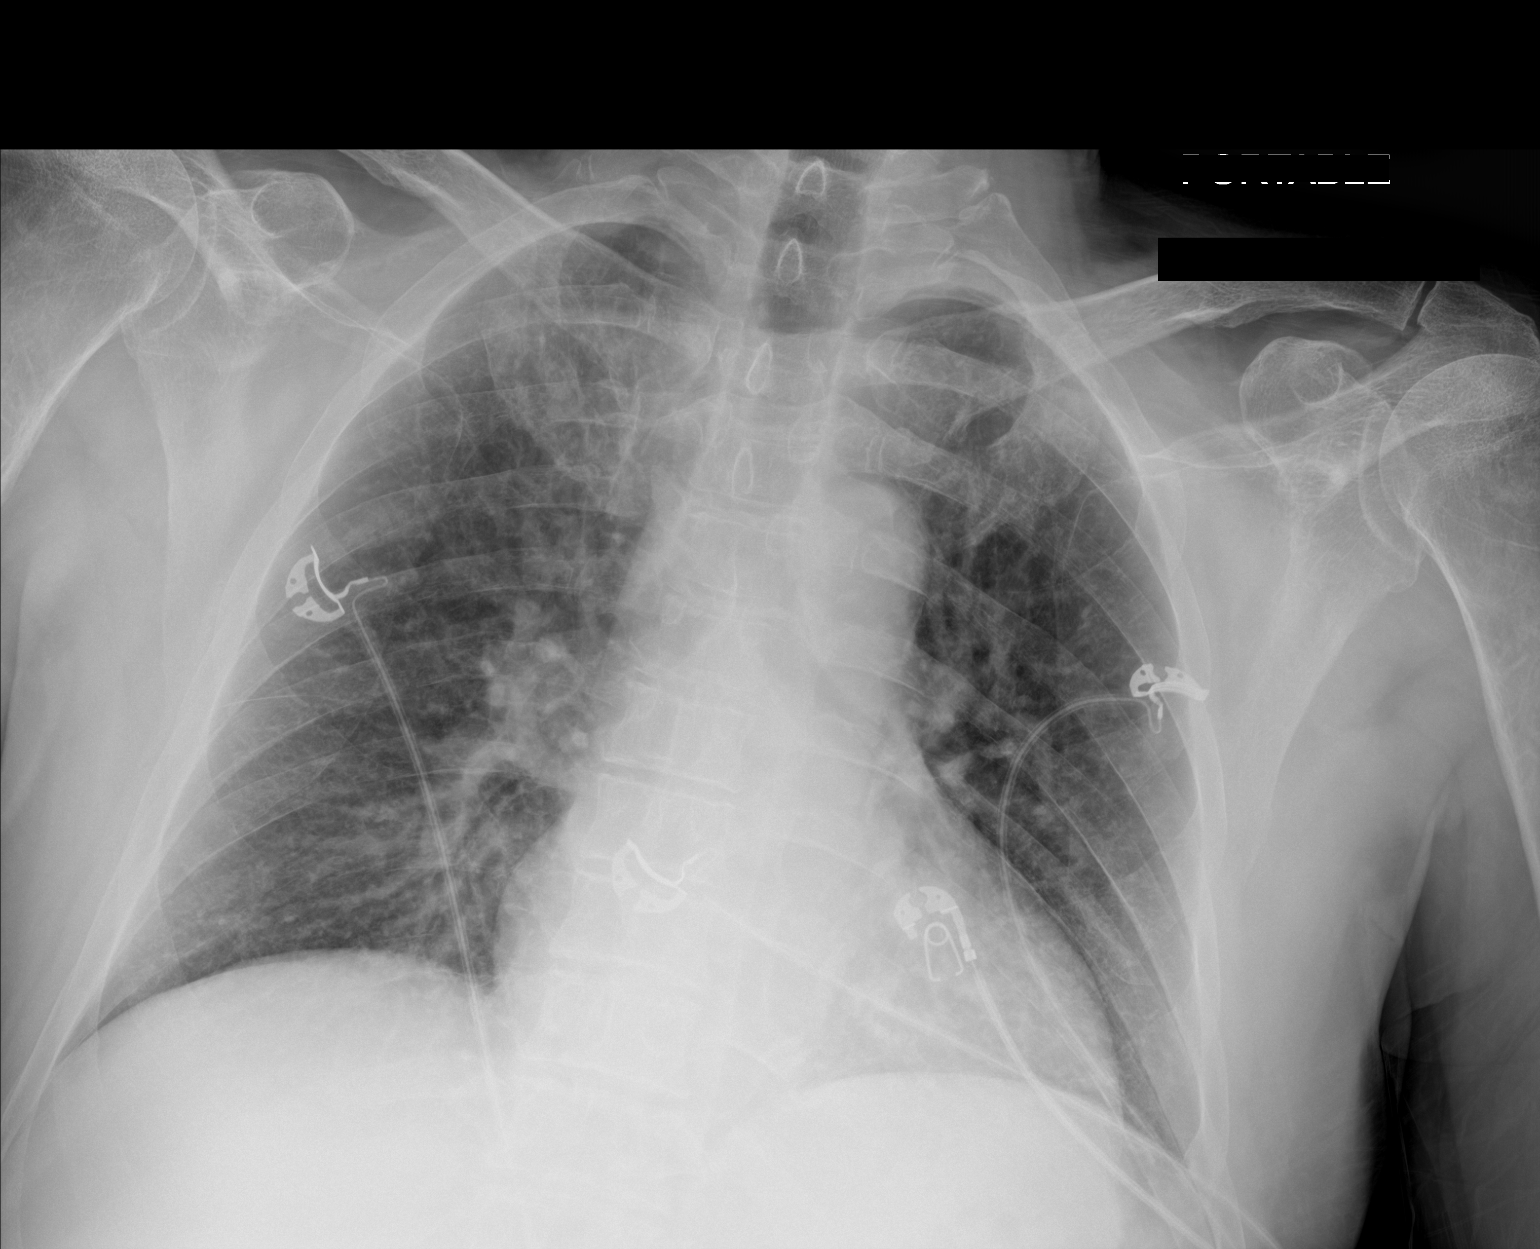

[1 of 1 positions shown; findings below may reference images not displayed]

FINDINGS: Low lung volumes with some atelectatic changes mild pulmonary
vascular congestion with some hazy interstitial opacities as well as
fissural and septal thickening. No pneumothorax. No effusion. The
aorta is calcified. The remaining cardiomediastinal contours are
unremarkable. Degenerative changes are present in the imaged spine
and shoulders. No visible displaced rib fracture or other acute
traumatic finding of the chest or imaged shoulders.
IMPRESSION: 1. Low lung volumes and atelectasis.
2. Pulmonary vascular congestion with hazy interstitial opacities
which could reflect mild interstitial edematous change.

## 2020-07-29 SURGERY — CRANIOTOMY HEMATOMA EVACUATION SUBDURAL
Anesthesia: General | Laterality: Right

## 2020-07-29 MED ORDER — ACETAMINOPHEN 10 MG/ML IV SOLN
1000.0000 mg | Freq: Once | INTRAVENOUS | Status: AC
  Administered 2020-07-29: 1000 mg via INTRAVENOUS

## 2020-07-29 MED ORDER — SUCCINYLCHOLINE CHLORIDE 20 MG/ML IJ SOLN
INTRAMUSCULAR | Status: DC | PRN
  Administered 2020-07-29: 100 mg via INTRAVENOUS

## 2020-07-29 MED ORDER — FENTANYL CITRATE (PF) 100 MCG/2ML IJ SOLN
INTRAMUSCULAR | Status: AC
  Filled 2020-07-29: qty 2

## 2020-07-29 MED ORDER — ONDANSETRON HCL 4 MG PO TABS: 4.0000 mg | ORAL_TABLET | ORAL | Status: DC | PRN

## 2020-07-29 MED ORDER — SUCCINYLCHOLINE CHLORIDE 200 MG/10ML IV SOSY
PREFILLED_SYRINGE | INTRAVENOUS | Status: AC
  Filled 2020-07-29: qty 10

## 2020-07-29 MED ORDER — PROPOFOL 10 MG/ML IV BOLUS
INTRAVENOUS | Status: AC
  Filled 2020-07-29: qty 20

## 2020-07-29 MED ORDER — EPHEDRINE 5 MG/ML INJ
INTRAVENOUS | Status: AC
  Filled 2020-07-29: qty 10

## 2020-07-29 MED ORDER — ONDANSETRON HCL 4 MG/2ML IJ SOLN: 4.0000 mg | Freq: Once | INTRAMUSCULAR | Status: DC | PRN

## 2020-07-29 MED ORDER — SODIUM CHLORIDE 0.9 % IV SOLN
INTRAVENOUS | Status: DC | PRN
  Administered 2020-07-29: 50 ug/min via INTRAVENOUS

## 2020-07-29 MED ORDER — LACTATED RINGERS IV SOLN: INTRAVENOUS | Status: DC | PRN

## 2020-07-29 MED ORDER — DEXMEDETOMIDINE (PRECEDEX) IN NS 20 MCG/5ML (4 MCG/ML) IV SYRINGE
PREFILLED_SYRINGE | INTRAVENOUS | Status: AC
  Filled 2020-07-29: qty 5

## 2020-07-29 MED ORDER — MANNITOL 25 % IV SOLN: 25.0000 g | Freq: Once | INTRAVENOUS | Status: DC

## 2020-07-29 MED ORDER — ACETAMINOPHEN 10 MG/ML IV SOLN
INTRAVENOUS | Status: AC
  Filled 2020-07-29: qty 100

## 2020-07-29 MED ORDER — BACITRACIN ZINC 500 UNIT/GM EX OINT
TOPICAL_OINTMENT | CUTANEOUS | Status: AC
  Filled 2020-07-29: qty 28.35

## 2020-07-29 MED ORDER — EPHEDRINE SULFATE 50 MG/ML IJ SOLN
INTRAMUSCULAR | Status: DC | PRN
  Administered 2020-07-29 (×2): 5 mg via INTRAVENOUS
  Administered 2020-07-29: 10 mg via INTRAVENOUS

## 2020-07-29 MED ORDER — LIDOCAINE HCL (PF) 2 % IJ SOLN
INTRAMUSCULAR | Status: AC
  Filled 2020-07-29: qty 5

## 2020-07-29 MED ORDER — SODIUM CHLORIDE 0.9 % IV SOLN: INTRAVENOUS | Status: DC | PRN

## 2020-07-29 MED ORDER — DEXMEDETOMIDINE (PRECEDEX) IN NS 20 MCG/5ML (4 MCG/ML) IV SYRINGE
PREFILLED_SYRINGE | INTRAVENOUS | Status: DC | PRN
  Administered 2020-07-29: 8 ug via INTRAVENOUS
  Administered 2020-07-29: 12 ug via INTRAVENOUS

## 2020-07-29 MED ORDER — ONDANSETRON HCL 4 MG/2ML IJ SOLN
INTRAMUSCULAR | Status: DC | PRN
  Administered 2020-07-29: 4 mg via INTRAVENOUS

## 2020-07-29 MED ORDER — ROCURONIUM BROMIDE 10 MG/ML (PF) SYRINGE
PREFILLED_SYRINGE | INTRAVENOUS | Status: AC
  Filled 2020-07-29: qty 10

## 2020-07-29 MED ORDER — ONDANSETRON HCL 4 MG/2ML IJ SOLN: 4.0000 mg | INTRAMUSCULAR | Status: DC | PRN

## 2020-07-29 MED ORDER — LIDOCAINE HCL (CARDIAC) PF 100 MG/5ML IV SOSY
PREFILLED_SYRINGE | INTRAVENOUS | Status: DC | PRN
  Administered 2020-07-29: 50 mg via INTRAVENOUS

## 2020-07-29 MED ORDER — SUMATRIPTAN SUCCINATE 50 MG PO TABS
100.0000 mg | ORAL_TABLET | Freq: Once | ORAL | Status: DC
  Filled 2020-07-29: qty 2

## 2020-07-29 MED ORDER — EPINEPHRINE PF 1 MG/ML IJ SOLN
1.0000 mg | Freq: Once | INTRAMUSCULAR | Status: AC
  Administered 2020-07-28: 1 mg via INTRAVENOUS

## 2020-07-29 MED ORDER — SODIUM CHLORIDE 0.9 % IV SOLN
INTRAVENOUS | Status: DC | PRN
  Administered 2020-07-29: 500 mg via INTRAVENOUS

## 2020-07-29 MED ORDER — MORPHINE SULFATE (PF) 2 MG/ML IV SOLN
1.0000 mg | Freq: Once | INTRAVENOUS | Status: AC
  Administered 2020-07-29: 1 mg via INTRAVENOUS
  Filled 2020-07-29: qty 1

## 2020-07-29 MED ORDER — ACETAMINOPHEN 325 MG PO TABS: 325.0000 mg | ORAL_TABLET | ORAL | Status: DC | PRN

## 2020-07-29 MED ORDER — SUGAMMADEX SODIUM 200 MG/2ML IV SOLN
INTRAVENOUS | Status: DC | PRN
  Administered 2020-07-29: 200 mg via INTRAVENOUS

## 2020-07-29 MED ORDER — LABETALOL HCL 5 MG/ML IV SOLN
INTRAVENOUS | Status: AC
  Filled 2020-07-29: qty 4

## 2020-07-29 MED ORDER — IOHEXOL 350 MG/ML SOLN
75.0000 mL | Freq: Once | INTRAVENOUS | Status: AC | PRN
  Administered 2020-07-29: 75 mL via INTRAVENOUS

## 2020-07-29 MED ORDER — BACITRACIN 500 UNIT/GM EX OINT
TOPICAL_OINTMENT | CUTANEOUS | Status: DC | PRN
  Administered 2020-07-29: 1 via TOPICAL

## 2020-07-29 MED ORDER — ACETAMINOPHEN 160 MG/5ML PO SOLN
325.0000 mg | ORAL | Status: DC | PRN
  Filled 2020-07-29: qty 20.3

## 2020-07-29 MED ORDER — FENTANYL CITRATE (PF) 100 MCG/2ML IJ SOLN
INTRAMUSCULAR | Status: AC
  Administered 2020-07-29: 12.5 ug via INTRAVENOUS
  Filled 2020-07-29: qty 2

## 2020-07-29 MED ORDER — ATROPINE SULFATE 1 MG/10ML IJ SOSY: 1.0000 mg | PREFILLED_SYRINGE | Freq: Once | INTRAMUSCULAR | Status: AC

## 2020-07-29 MED ORDER — PROPOFOL 10 MG/ML IV BOLUS
INTRAVENOUS | Status: DC | PRN
  Administered 2020-07-29: 50 mg via INTRAVENOUS
  Administered 2020-07-29: 150 mg via INTRAVENOUS

## 2020-07-29 MED ORDER — ROCURONIUM BROMIDE 100 MG/10ML IV SOLN
INTRAVENOUS | Status: DC | PRN
  Administered 2020-07-29: 20 mg via INTRAVENOUS
  Administered 2020-07-29: 50 mg via INTRAVENOUS

## 2020-07-29 MED ORDER — 0.9 % SODIUM CHLORIDE (POUR BTL) OPTIME
TOPICAL | Status: DC | PRN
  Administered 2020-07-29: 1000 mL

## 2020-07-29 MED ORDER — LABETALOL HCL 5 MG/ML IV SOLN: 10.0000 mg | INTRAVENOUS | Status: DC | PRN

## 2020-07-29 MED ORDER — CEFAZOLIN SODIUM-DEXTROSE 2-4 GM/100ML-% IV SOLN
2.0000 g | Freq: Once | INTRAVENOUS | Status: AC
  Administered 2020-07-29: 2 g via INTRAVENOUS
  Filled 2020-07-29: qty 100

## 2020-07-29 MED ORDER — MANNITOL 20 % IV SOLN
75.0000 g | Freq: Once | INTRAVENOUS | Status: AC
  Administered 2020-07-29: 75 g via INTRAVENOUS
  Filled 2020-07-29: qty 375

## 2020-07-29 MED ORDER — DEXAMETHASONE SODIUM PHOSPHATE 10 MG/ML IJ SOLN
INTRAMUSCULAR | Status: DC | PRN
  Administered 2020-07-29: 4 mg via INTRAVENOUS

## 2020-07-29 MED ORDER — FENTANYL CITRATE (PF) 100 MCG/2ML IJ SOLN
INTRAMUSCULAR | Status: DC | PRN
  Administered 2020-07-29 (×4): 50 ug via INTRAVENOUS

## 2020-07-29 MED ORDER — FENTANYL CITRATE (PF) 100 MCG/2ML IJ SOLN: 12.5000 ug | INTRAMUSCULAR | Status: DC | PRN

## 2020-07-29 MED ORDER — CEFAZOLIN (ANCEF) 1 G IV SOLR: 2.0000 g | INTRAVENOUS | Status: DC

## 2020-07-29 MED ORDER — MANNITOL 25 % IV SOLN
INTRAVENOUS | Status: DC | PRN
  Administered 2020-07-29: 50 g via INTRAVENOUS

## 2020-07-29 MED ORDER — DEXAMETHASONE SODIUM PHOSPHATE 10 MG/ML IJ SOLN
INTRAMUSCULAR | Status: AC
  Filled 2020-07-29: qty 1

## 2020-07-29 MED FILL — Medication: Qty: 1 | Status: AC

## 2020-07-29 SURGICAL SUPPLY — 73 items
APPLICATOR CHLORAPREP 10.5 ORG (MISCELLANEOUS) ×4 IMPLANT
BASIN GRAD PLASTIC 32OZ STRL (MISCELLANEOUS) IMPLANT
BLADE CLIPPER SPEC (BLADE) ×2 IMPLANT
BLADE SURG 15 STRL LF DISP TIS (BLADE) ×1 IMPLANT
BLADE SURG 15 STRL SS (BLADE) ×1
BULB RESERV EVAC DRAIN JP 100C (MISCELLANEOUS) ×2 IMPLANT
BUR ACORN 7.5 PRECISION (BURR) ×2 IMPLANT
BUR SPIRAL ROUTER 2.3 (BUR) ×2 IMPLANT
CNTNR SPEC 2.5X3XGRAD LEK (MISCELLANEOUS)
CONT SPEC 4OZ STER OR WHT (MISCELLANEOUS)
CONTAINER SPEC 2.5X3XGRAD LEK (MISCELLANEOUS) IMPLANT
CORD BIP STRL DISP 12FT (MISCELLANEOUS) ×2 IMPLANT
COUNTER NEEDLE 20/40 LG (NEEDLE) ×2 IMPLANT
DRAIN CHANNEL JP 10F RND 20C F (MISCELLANEOUS) ×2 IMPLANT
DRAIN JP 10F RND SILICONE (MISCELLANEOUS) IMPLANT
DRAPE INCISE 23X17 IOBAN STRL (DRAPES) ×2
DRAPE INCISE IOBAN 23X17 STRL (DRAPES) ×2 IMPLANT
DRAPE INCISE IOBAN 66X45 STRL (DRAPES) ×2 IMPLANT
DRAPE SURG 17X11 SM STRL (DRAPES) ×2 IMPLANT
DRAPE WARM FLUID 44X44 (DRAPES) IMPLANT
DRSG TEGADERM 4X4.75 (GAUZE/BANDAGES/DRESSINGS) IMPLANT
DRSG TELFA 3X8 NADH (GAUZE/BANDAGES/DRESSINGS) ×2 IMPLANT
ELECT CAUTERY BLADE TIP 2.5 (TIP) ×2
ELECT REM PT RETURN 9FT ADLT (ELECTROSURGICAL) ×2
ELECTRODE CAUTERY BLDE TIP 2.5 (TIP) ×1 IMPLANT
ELECTRODE REM PT RTRN 9FT ADLT (ELECTROSURGICAL) ×1 IMPLANT
GAUZE 4X4 16PLY ~~LOC~~+RFID DBL (SPONGE) ×4 IMPLANT
GAUZE XEROFORM 1X8 LF (GAUZE/BANDAGES/DRESSINGS) ×2 IMPLANT
GLOVE SURG SYN 8.5  E (GLOVE) ×4
GLOVE SURG SYN 8.5 E (GLOVE) ×4 IMPLANT
GLOVE SURG UNDER POLY LF SZ8.5 (GLOVE) ×2 IMPLANT
GOWN SRG XL LVL 3 NONREINFORCE (GOWNS) ×1 IMPLANT
GOWN STRL NON-REIN TWL XL LVL3 (GOWNS) ×1
GRADUATE 1200CC STRL 31836 (MISCELLANEOUS) ×2 IMPLANT
GRAFT DURAGEN MATRIX 3WX3L (Graft) ×1 IMPLANT
GRAFT DURAGEN MATRIX 3X3 SNGL (Graft) ×1 IMPLANT
HEMOSTAT SURGICEL 2X14 (HEMOSTASIS) IMPLANT
HEMOSTAT SURGICEL 2X3 (HEMOSTASIS) IMPLANT
HEMOSTAT SURGICEL 4X8 (HEMOSTASIS) IMPLANT
HOOK STAY BLUNT/RETRACTOR 5M (MISCELLANEOUS) ×2 IMPLANT
KIT TURNOVER KIT A (KITS) ×2 IMPLANT
MANIFOLD NEPTUNE II (INSTRUMENTS) ×2 IMPLANT
MARKER SKIN DUAL TIP RULER LAB (MISCELLANEOUS) ×4 IMPLANT
MAT ABSORB  FLUID 56X50 GRAY (MISCELLANEOUS) ×1
MAT ABSORB FLUID 56X50 GRAY (MISCELLANEOUS) ×1 IMPLANT
NEEDLE HYPO 22GX1.5 SAFETY (NEEDLE) ×2 IMPLANT
NS IRRIG 1000ML POUR BTL (IV SOLUTION) ×2 IMPLANT
NS IRRIG 500ML POUR BTL (IV SOLUTION) ×4 IMPLANT
PACK CRANIOTOMY CUSTOM (CUSTOM PROCEDURE TRAY) ×2 IMPLANT
PAD ARMBOARD 7.5X6 YLW CONV (MISCELLANEOUS) ×4 IMPLANT
PIN MAYFIELD SKULL DISP (PIN) IMPLANT
PLATE 1.5/0.5 18.5MM BURR HOLE (Plate) ×8 IMPLANT
SCREW SELF DRILL HT 1.5/4MM (Screw) ×40 IMPLANT
SET CATH VENT DRAIN 3-15 1.9D (DRAIN) IMPLANT
SHEET NEURO XL SOL CTL (MISCELLANEOUS) IMPLANT
SLEEVE SCD COMPRESS KNEE MED (STOCKING) ×2 IMPLANT
SOL PREP PROV IODINE SCRUB 4OZ (MISCELLANEOUS) IMPLANT
SOL PREP PVP 2OZ (MISCELLANEOUS)
SOLUTION PREP PVP 2OZ (MISCELLANEOUS) IMPLANT
SPOGE SURGIFLO 8M (HEMOSTASIS)
SPONGE SURGIFLO 8M (HEMOSTASIS) IMPLANT
SPONGE T-LAP 18X18 ~~LOC~~+RFID (SPONGE) ×4 IMPLANT
STAPLER SKIN PROX 35W (STAPLE) ×4 IMPLANT
SURGIFLO W/THROMBIN 8M KIT (HEMOSTASIS) ×2 IMPLANT
SURGILUBE 2OZ TUBE FLIPTOP (MISCELLANEOUS) IMPLANT
SUT NURALON 4 0 TR CR/8 (SUTURE) ×2 IMPLANT
SUT VIC AB 2-0 CT1 18 (SUTURE) ×4 IMPLANT
SYR 10ML LL (SYRINGE) ×2 IMPLANT
SYR 20ML LL LF (SYRINGE) ×4 IMPLANT
TAPE CLOTH 3X10 WHT NS LF (GAUZE/BANDAGES/DRESSINGS) ×2 IMPLANT
TOWEL OR 17X26 4PK STRL BLUE (TOWEL DISPOSABLE) ×8 IMPLANT
TRAY FOLEY SLVR 16FR TEMP STAT (SET/KITS/TRAYS/PACK) IMPLANT
WATER STERILE IRR 1000ML POUR (IV SOLUTION) IMPLANT

## 2020-07-29 NOTE — Anesthesia Procedure Notes (Signed)
Arterial Line Insertion Start/End7/09/2020 5:50 AM, 07/29/2020 5:55 AM Performed by: Arita Miss, MD, Rolla Plate, CRNA, CRNA  Patient location: OR. Preanesthetic checklist: patient identified, IV checked, site marked, risks and benefits discussed, surgical consent, monitors and equipment checked, pre-op evaluation, timeout performed and anesthesia consent Lidocaine 1% used for infiltration and patient sedated Left, radial was placed Catheter size: 20 Fr Hand hygiene performed , maximum sterile barriers used  and Seldinger technique used Allen's test indicative of satisfactory collateral circulation Attempts: 2 Procedure performed without using ultrasound guided technique. Following insertion, dressing applied and Biopatch. Post procedure assessment: normal and unchanged  Patient tolerated the procedure well with no immediate complications.

## 2020-07-29 NOTE — Op Note (Signed)
Indications: The patient is a 65yo male who presented with an acute right sided subdural hematoma after a fall.  He was worsening neurologically prompting surgical intervention.  Findings: large R subdural hematoma  Preoperative Diagnosis: subdural hematoma, brain compression Postoperative Diagnosis: same   EBL: 400 ml IVF: 1300 ml Drains: 1 placed Disposition: Extubated and critically ill to PACU Complications: none  A foley catheter was placed in the ER.   Preoperative Note:   Risks of surgery discussed include: infection, bleeding, stroke, coma, death, paralysis, CSF leak, nerve/spinal cord injury, numbness, tingling, weakness, complex regional pain syndrome, recurrent stenosis and/or disc herniation, vascular injury, development of instability, neck/back pain, need for further surgery, persistent symptoms, development of deformity, and the risks of anesthesia. The patient and his wife understood these risks and agreed to proceed.  NAME OF PROCEDURE:               1. Right Craniotomy for subdural hematoma evacuation   PROCEDURE:  Patient was brought to the operating room, intubated. The mayfield pins were applied.  The patient was then positioned for a right-sided frontotemporoparietal craniotomy.  Mannitol and keppra were given.  The incision was planned, then prepped and draped in standard fashion.  The incision was opened sharply, then the galea opened.  A myocutaneous flap was lifted to expose a large right-sided bone flap. The skin and muscle were retracted with fish hooks. A large frontotemporoparietal craniotomy was then fashioned with the burr and craniotome.  The dura was identified, then opened sharply. Acute blood products were noted.  Using a variety of suction and irrigation, the hematoma was evacuated. The brain was visualized and noted to be free from compression after the hematoma was removed.  The brain was inspected for active bleeding.  A small area was noted and  coagulated.  Intradural hemostasis was achieved. The area was irrigated.  We then turned attention to closure.  The dura was closed. The craniotomy flap was secured with Lorenz plates. Tackups centrally and peripherally had been placed and secured. A subgaleal drain was placed.  The temporalis and galea were closed with 2-0 vicryl.  Staples were used on the skin. A sterile dressing was applied.   Needle, lap and all counts were correct at the end of the case.    Meade Maw MD Neurosurgery

## 2020-07-29 NOTE — Anesthesia Preprocedure Evaluation (Addendum)
Anesthesia Evaluation  Patient identified by MRN, date of birth, ID band Patient awake  General Assessment Comment:62mm midline shift from subdural hematoma.  Apparently had a blown pupil, poor mental status, and family declined intervention. Mannitol was given as a last resort, and his mental status improved and he wished for surgical intervention. Dr Cari Caraway relayed to me that he spoke to the patient and family who will perioperatively rescind his DNR.  Reviewed: Allergy & Precautions, NPO status , Patient's Chart, lab work & pertinent test results  History of Anesthesia Complications Negative for: history of anesthetic complications  Airway Mallampati: II  TM Distance: >3 FB Neck ROM: Full    Dental no notable dental hx. (+) Teeth Intact   Pulmonary neg pulmonary ROS, neg sleep apnea, neg COPD, Patient abstained from smoking.Not current smoker,    Pulmonary exam normal breath sounds clear to auscultation       Cardiovascular Exercise Tolerance: Good METS(-) hypertension(-) CAD and (-) Past MI negative cardio ROS  (-) dysrhythmias  Rhythm:Regular Rate:Normal - Systolic murmurs    Neuro/Psych  Headaches, PSYCHIATRIC DISORDERS Depression    GI/Hepatic GERD  Medicated and Controlled,(+)     (-) substance abuse  ,   Endo/Other  diabetes  Renal/GU negative Renal ROS     Musculoskeletal   Abdominal   Peds  Hematology   Anesthesia Other Findings Past Medical History: No date: Depression No date: Frequency of urination     Comment:  since radiation tx No date: GERD (gastroesophageal reflux disease) No date: Headache No date: History of traumatic head injury     Comment:  age 36--  head injury w/ fractured skull in coma for 3               days made full recovery withou further treatment--  no               residual No date: Nocturia urologist-  dr ottelin/  oncologist- dr [name]: Prostate cancer [facility])      Comment:  T1c, Gleason 4+4,  PSA 14.24,  vol 25.6cc/  IM radiation              therapy (05-23-2015 to 06-27-2015)   05-23-2015  to  06-27-2015: S/P radiation therapy     Comment:  prostatic fossa  68.4 Gy in 38 fractions in 1.8 Gy No date: Type 2 diabetes, diet controlled ([place]) No date: Ulcer No date: Wears glasses  Reproductive/Obstetrics                            Anesthesia Physical Anesthesia Plan  ASA: 4 and emergent  Anesthesia Plan: General   Post-op Pain Management:    Induction: Intravenous  PONV Risk Score and Plan: 2 and Ondansetron, Dexamethasone and Treatment may vary due to age or medical condition  Airway Management Planned: Oral ETT and Video Laryngoscope Planned  Additional Equipment: Arterial line  Intra-op Plan:   Post-operative Plan: Possible Post-op intubation/ventilation  Informed Consent: I have reviewed the patients History and Physical, chart, labs and discussed the procedure including the risks, benefits and alternatives for the proposed anesthesia with the patient or authorized representative who has indicated his/her understanding and acceptance.   Patient has DNR.  Discussed DNR with patient and Suspend DNR.   Dental advisory given  Plan Discussed with: CRNA and Surgeon  Anesthesia Plan Comments: (Discussed risks of anesthesia with patient, including PONV, sore throat, lip/dental damage. Rare risks discussed  as well, such as cardiorespiratory and neurological sequelae, including death. Advised patient and family that there is a strong possibility of post-operative prolonged intubation. Patient understands.)       Anesthesia Quick Evaluation

## 2020-07-29 NOTE — Discharge Summary (Signed)
Physician Discharge Summary  Patient ID: Charles Marks MRN: [id number] DOB/AGE: [birth year] 65 y.o.  Admit date: 07/28/2020 Discharge date: 07/29/2020  Admission Diagnoses: subdural hematoma, acute; brain compression; coma  Discharge Diagnoses:  Active Problems:   Unresponsiveness   Subdural hematoma [facility])   Discharged Condition: critical  Hospital Course: Charles Marks presented to the [facility] ER in critical condition after a fall.  He had a dilated pupil and no neurological examination.  He underwent head CT, which showed a large R subdural hematoma.  After receiving mannitol, he had an substantial improvement in exam and was taken to the operating room for hematoma evacuation.  He was extubated and recovered in the PACU.  Due to lack of staffed ICU beds and neurocritical care needs, transfer was sought and accepted to [place]: None  Significant Diagnostic Studies: radiology: CT scan: Head CT showing large acute subdural hematoma with midline shift and brain compression  Treatments: surgery: Craniotomy for hematoma evacuation  Discharge Exam: Blood pressure (!) 127/34, pulse 83, temperature 98 F (36.7 C), resp. rate 16, height 6\' 3"  (1.905 m), weight 86.2 kg, SpO2 97 %. General appearance: cooperative and appears stated age OE voice, regards, follow commands CN intact Speech clear and fluent L drift RUE AND RLE 5/5 LUE and LLE 2/5 but have improved.   Disposition: Discharge disposition: 65-Hospice/Medical Facility    transfer to [facility]  Discharge Instructions     Discharge patient   Complete by: As directed    Discharge disposition: 65-Hospice/Medical Facility   Discharge patient date: 07/29/2020      Allergies as of 07/29/2020   No Known Allergies      Medication List     TAKE these medications    Multi-Vitamins Tabs Take 1 tablet by mouth daily.   omeprazole 40 MG capsule Commonly known as: PRILOSEC   QUEtiapine  200 MG 24 hr tablet Commonly known as: SEROQUEL XR Take 200 mg by mouth at bedtime.   sertraline 100 MG tablet Commonly known as: ZOLOFT Take 200 mg by mouth daily.   SUMAtriptan 100 MG tablet Commonly known as: IMITREX   Vitamin D3 25 MCG (1000 UT) Caps Take 1 capsule by mouth daily.         SignedMeade Maw 07/29/2020, 12:33 PM

## 2020-07-29 NOTE — Anesthesia Procedure Notes (Signed)
Procedure Name: Intubation Date/Time: 07/29/2020 6:08 AM Performed by: Rolla Plate, CRNA Pre-anesthesia Checklist: Patient identified, Patient being monitored, Timeout performed, Emergency Drugs available and Suction available Patient Re-evaluated:Patient Re-evaluated prior to induction Oxygen Delivery Method: Circle system utilized Preoxygenation: Pre-oxygenation with 100% oxygen Induction Type: IV induction and Rapid sequence Laryngoscope Size: McGraph and 4 Grade View: Grade I Tube type: Oral Tube size: 8.0 mm Number of attempts: 1 Airway Equipment and Method: Stylet and Video-laryngoscopy Placement Confirmation: ETT inserted through vocal cords under direct vision, positive ETCO2 and breath sounds checked- equal and bilateral Secured at: 23 cm Tube secured with: Tape Dental Injury: Teeth and Oropharynx as per pre-operative assessment

## 2020-07-29 NOTE — ED Notes (Signed)
Pt talking some, reports 'head hurts,' offered pain meds 'if it will help the pain'

## 2020-07-29 NOTE — ED Notes (Addendum)
Per triage RN, pt brought in with wife POV. Wife reports heard loud thud and found pt on floor 2115, pt has been progressively lethargic vs unresponsive (37min car ride). Vomited en route to ed. Pt stated name 1 time, no other verbal response. Skin clammy and cool. Blood sugar 214. Wife states pt has been in normal health, only change is increase in home migraine med.

## 2020-07-29 NOTE — Transfer of Care (Signed)
Immediate Anesthesia Transfer of Care Note  Patient: Charles Marks  Procedure(s) Performed: CRANIOTOMY HEMATOMA EVACUATION SUBDURAL (Right)  Patient Location: PACU  Anesthesia Type:General  Level of Consciousness: awake, drowsy and patient cooperative  Airway & Oxygen Therapy: Patient Spontanous Breathing and Patient connected to nasal cannula oxygen  Post-op Assessment: Post -op Vital signs reviewed and stable and Patient able to stick tongue midline  Post vital signs: Reviewed and stable  Last Vitals:  Vitals Value Taken Time  BP 124/57 07/29/20 0836  Temp    Pulse 74 07/29/20 0841  Resp 16 07/29/20 0841  SpO2 98 % 07/29/20 0841  Vitals shown include unvalidated device data.  Last Pain:  Vitals:   07/29/20 0045  TempSrc: Bladder  PainSc:          Complications: No notable events documented.

## 2020-07-29 NOTE — ED Notes (Signed)
Per Dr. Jari Pigg, not a code stroke.

## 2020-07-29 NOTE — ED Notes (Addendum)
covid swab completed, facial grimace noted. Dr. Jari Pigg speaking with wife about CT results. Pt remains verbally unresponsive Withdrawals to pain R leg, intermittent spontaneous movement R hand

## 2020-07-29 NOTE — ED Notes (Signed)
Dr. Izora Ribas bedside speaking to pt and family, consent signed, reports will temporarily lift DNR, antibiotic order placed, type and screen sent to lab. Pt wife and chaplin remain at bedside

## 2020-07-29 NOTE — Consult Note (Addendum)
Referring Physician:  No referring provider defined for this encounter.  Primary Physician:  Maryland Pink, MD  Chief Complaint:  fall  History of Present Illness: 07/29/2020 Charles Marks is a 65 y.o. male who presents with the chief complaint of fall and alteration of mental status.  He was brought to the ER where he was noted to have a R pupil dilation and was not able to participate in exam at that time. CT showed a large subdural hematoma.  He was given mannitol which prompted a significant recovery.  He is now talking about able to participate in exam.  He has a severe headache and is having trouble moving his left leg.  He had a fall and hit his head on the floor at home.  It was unwitnessed.  He is reporting L side weakness.  Review of Systems:  A 10 point review of systems is negative, except for the pertinent positives and negatives detailed in the HPI.  Past Medical History: Past Medical History:  Diagnosis Date   Depression    Frequency of urination    since radiation tx   GERD (gastroesophageal reflux disease)    Headache    History of traumatic head injury    age 57--  head injury w/ fractured skull in coma for 3 days made full recovery withou further treatment--  no residual   Nocturia    Prostate cancer [facility]) urologist-  dr ottelin/  oncologist- dr [name]   T1c, Gleason 4+4,  PSA 14.24,  vol 25.6cc/  IM radiation therapy (05-23-2015 to 06-27-2015)     S/P radiation therapy 05-23-2015  to  06-27-2015   prostatic fossa  68.4 Gy in 38 fractions in 1.8 Gy   Type 2 diabetes, diet controlled ([place])    Ulcer    Wears glasses     Past Surgical History: Past Surgical History:  Procedure Laterality Date   APPENDECTOMY  1975   COLONOSCOPY  2012 approx   COLONOSCOPY WITH PROPOFOL N/A 04/29/2016   Procedure: COLONOSCOPY WITH PROPOFOL;  Surgeon: Manya Silvas, MD;  Location: [place];  Service: Endoscopy;  Laterality: N/A;   CYSTOSCOPY N/A 07/18/2015    Procedure: CYSTOSCOPY FLEXIBLE;  Surgeon: Kathie Rhodes, MD;  Location: [facility];  Service: Urology;  Laterality: N/A;  no seeds found in bladder   PROSTATE BIOPSY     RADIOACTIVE SEED IMPLANT N/A 07/18/2015   Procedure: RADIOACTIVE SEED IMPLANT/BRACHYTHERAPY IMPLANT;  Surgeon: Kathie Rhodes, MD;  Location: [place];  Service: Urology;  Laterality: N/A;     48  seeds implanted    Allergies: Allergies as of 07/28/2020   (No Known Allergies)    Medications:  Current Facility-Administered Medications:    ceFAZolin (ANCEF) IVPB 2g/100 mL premix, 2 g, Intravenous, Once, Athena Masse, MD  Current Outpatient Medications:    Cholecalciferol (VITAMIN D3) 1000 units CAPS, Take 1 capsule by mouth daily. , Disp: , Rfl:    Cyanocobalamin (VITAMIN B-12) 5000 MCG SUBL, Place under the tongue., Disp: , Rfl:    ferrous sulfate 325 (65 FE) MG tablet, Take 325 mg by mouth daily. , Disp: , Rfl:    HYDROcodone-acetaminophen (NORCO) 10-325 MG tablet, Take 1-2 tablets by mouth every 4 (four) hours as needed for moderate pain. Maximum dose per 24 hours - 8 pills (Patient not taking: Reported on 08/11/2015), Disp: 18 tablet, Rfl: 0   loperamide (IMODIUM) 2 MG capsule, Take 2 mg by mouth as needed for diarrhea or  loose stools., Disp: , Rfl:    Multiple Vitamin (MULTI-VITAMINS) TABS, Take 1 tablet by mouth daily. , Disp: , Rfl:    omeprazole (PRILOSEC) 40 MG capsule, , Disp: , Rfl:    QUEtiapine Fumarate (SEROQUEL XR) 150 MG 24 hr tablet, Take 150 mg by mouth at bedtime. , Disp: , Rfl:    sertraline (ZOLOFT) 100 MG tablet, , Disp: , Rfl:    silodosin (RAPAFLO) 8 MG CAPS capsule, Take 8 mg by mouth daily with breakfast., Disp: , Rfl:    SUMAtriptan (IMITREX) 100 MG tablet, , Disp: , Rfl:    tiZANidine (ZANAFLEX) 4 MG capsule, Take 4 mg by mouth at bedtime as needed for muscle spasms., Disp: , Rfl:    vitamin C (ASCORBIC ACID) 500 MG tablet, Take 500 mg by mouth daily. , Disp: ,  Rfl:    Social History: Social History   Tobacco Use   Smoking status: Never   Smokeless tobacco: Never  Substance Use Topics   Alcohol use: Yes    Comment: occasional beer/wine    Drug use: No    Family Medical History: Family History  Problem Relation Age of Onset   Cancer Mother        brain tumor   Multiple sclerosis Mother    Diabetes Sister    Cancer Sister        lung    Physical Examination: Vitals:   07/29/20 0330 07/29/20 0400  BP:  (!) 122/52  Pulse: 67 62  Resp: 13 14  Temp: 98.5 F (36.9 C) 98.8 F (37.1 C)  SpO2: 100% 100%   Heart sounds normal no MRG. Chest Clear to Auscultation Bilaterally.  General: Patient is well developed, well nourished, calm, collected, and in moderate apparent distress.  Psychiatric: Patient is non-anxious.  Head:  Pupils equal, round, and reactive to light.  ENT:  Oral mucosa appears well hydrated.  Neck:   Supple.  Full range of motion.  Respiratory: Patient is breathing without any difficulty.  Extremities: No edema.  Vascular: Palpable pulses in dorsal pedal vessels.  Skin:   On exposed skin, there are no abnormal skin lesions.  NEUROLOGICAL:  General: In no acute distress.   OE to voice, speaks, regards, follows commands.  Speech is soft but fluent.  Oriented to person, place, and time.  Pupils equal round and reactive to light.  Facial tone is symmetric.  Tongue protrusion is midline.  There is left pronator drift.  He is 5/5 on the right.  LUE is 4/5 throughout.  LLE 1/5.   SILT BUE and BLE.  Reflexes are 1+ and symmetric at the biceps, triceps, brachioradialis, patella and achilles. Hoffman's is absent.  Clonus is not present.  Toes are down-going.    Gait is untested.   Imaging: CT Head 07/29/20 IMPRESSION: 1. Mixed density holo hemispheric subdural hematoma overlying the right cerebral hemisphere, measuring up to 2 cm in thickness. Associated mass effect with up to 1.3 cm of right-to-left  midline shift. No hydrocephalus or ventricular trapping at this time. 2. Small volume extra-axial hemorrhage overlying the anterior left cerebral convexity, measuring up to 7 mm in thickness without significant mass effect. 3. Scattered subarachnoid hemorrhage as above, most pronounced at the right frontotemporal region/right sylvian fissure. 4. Acute nondisplaced right temporal bone fracture with associated right mastoid and middle ear effusion. 5. Acute nondisplaced, nondepressed left and right occipital skull fractures.   Critical Value/emergent results were called by telephone at the time of interpretation on  07/29/2020 at 12:22 am to provider Dr. Jari Pigg, Who verbally acknowledged these results.     Electronically Signed   By: Jeannine Boga M.D.   On: 07/29/2020 00:27    I have personally reviewed the images and agree with the above interpretation.  Labs: CBC Latest Ref Rng & Units 07/29/2020 08/14/2015 07/11/2015  WBC 4.0 - 10.5 K/uL 12.3(H) 3.8 3.1(L)  Hemoglobin 13.0 - 17.0 g/dL 14.8 14.3 13.3  Hematocrit 39.0 - 52.0 % 42.5 42.5 39.5  Platelets 150 - 400 K/uL 165 168 169       Assessment and Plan: Mr. Ragain is a pleasant 65 y.o. male with an acute subdural hematoma.  He is GCS15 right now, but has substantial brain compression.  He has left-sided weakness.    Due to his improvement with mannitol, I feel that evacuation of his hematoma is indicated to save his life.  He has previously signed a DNR order, but was able to participate in a conversation as well as his wife.  They clearly understand the risks and would like to temporarily lift his DNR status for surgery.  He will need ICU admission after surgery.  We will arrange that after surgical evacuation of the clot. We will give keppra for seizure prevention.  I discussed the planned procedure at length with the patient, including the risks, benefits, alternatives, and indications. The risks discussed include but  are not limited to bleeding, infection, need for reoperation, spinal fluid leak, stroke, vision loss, anesthetic complication, coma, paralysis, and even death. I also described in detail that improvement was not guaranteed.  The patient and his wife expressed understanding of these risks, and asked that we proceed with surgery. I described the surgery in layman's terms, and gave ample opportunity for questions, which were answered to the best of my ability.  Due to his acute and critical illness, his wife signed his consent form as a proxy.   Javar Eshbach K. Izora Ribas MD, [place] Dept. of Neurosurgery

## 2020-07-29 NOTE — Progress Notes (Signed)
PACU note: Pt alert, clear speech, moves all extremities, left slightly weaker than right. VSS. Resting quietly with pain (headache) with pain level 1-2/10.  Family had been at bedside.  Awaiting transfer arrangements to [facility].

## 2020-07-29 NOTE — ED Notes (Signed)
Pt HR 44, 1mg  atropine and 1 mg epinephrine given per Dr. Tamala Julian. HR improved, pt remains nonverbal, R pupil blown. Pt taken to CT 2356 with Dr. Jari Pigg and RN x2

## 2020-07-29 NOTE — Progress Notes (Signed)
As follow up from previous chaplain met with patient wife and brother in law outside of surgical waiting. They are quite hopeful referencing the turnaround from the critical condition he was in ED. Will continue to check on family and patient once in a room.

## 2020-07-29 NOTE — Anesthesia Postprocedure Evaluation (Signed)
Anesthesia Post Note  Patient: Charles Marks  Procedure(s) Performed: CRANIOTOMY HEMATOMA EVACUATION SUBDURAL (Right)  Anesthesia Type: General Anesthetic complications: no   No notable events documented.   Last Vitals:  Vitals:   07/29/20 0851 07/29/20 0906  BP: (!) 118/57 (!) 122/54  Pulse: 64   Resp: 16   Temp:    SpO2: 98%     Last Pain:  Vitals:   07/29/20 0921  TempSrc:   PainSc: 5                  Deno Etienne

## 2020-07-29 NOTE — Consult Note (Signed)
Consult received - discussed with Dr. Jari Pigg.  Mr. Kaupp has suffered a quick decline and has a blown pupil with little exam at this point.  HCT shows large acute SDH with extensive midline shift (82mm).   Mr. Bun was explicit with family that he would not want to be dependent and was focused on quality of life.  Because of his poor neurological status and low chance of recovery to independence, his family has declined intervention.  Dr. Jari Pigg will discuss goals of care with family.

## 2020-07-29 NOTE — ED Provider Notes (Addendum)
[facility] Emergency Department Provider Note  ____________________________________________   Event Date/Time   First MD Initiated Contact with Patient 07/29/20 0006     (approximate)  I have reviewed the triage vital signs and the nursing notes.   HISTORY  Chief Complaint Fall and Altered Mental Status    HPI Charles Marks is a 65 y.o. male with depression, diabetes who comes in with concern for a fall according to wife she heard a loud thud and found patient on the floor.  They initially were able to get patient here by private vehicle but patient is becoming progressively more lethargic and unresponsive.  Episode of vomiting prior to arrival.  Blood sugar was normal at 214.  Unable to get full HPI due to patient's altered mental status          Past Medical History:  Diagnosis Date   Depression    Frequency of urination    since radiation tx   GERD (gastroesophageal reflux disease)    Headache    History of traumatic head injury    age 53--  head injury w/ fractured skull in coma for 3 days made full recovery withou further treatment--  no residual   Nocturia    Prostate cancer [facility]) urologist-  dr ottelin/  oncologist- dr [name]   T1c, Gleason 4+4,  PSA 14.24,  vol 25.6cc/  IM radiation therapy (05-23-2015 to 06-27-2015)     S/P radiation therapy 05-23-2015  to  06-27-2015   prostatic fossa  68.4 Gy in 38 fractions in 1.8 Gy   Type 2 diabetes, diet controlled ([place])    Ulcer    Wears glasses     Patient Active Problem List   Diagnosis Date Noted   Malignant neoplasm of prostate ([place]) 03/23/2015    Past Surgical History:  Procedure Laterality Date   APPENDECTOMY  1975   COLONOSCOPY  2012 approx   COLONOSCOPY WITH PROPOFOL N/A 04/29/2016   Procedure: COLONOSCOPY WITH PROPOFOL;  Surgeon: Manya Silvas, MD;  Location: [place];  Service: Endoscopy;  Laterality: N/A;   CYSTOSCOPY N/A 07/18/2015   Procedure: CYSTOSCOPY  FLEXIBLE;  Surgeon: Kathie Rhodes, MD;  Location: [facility];  Service: Urology;  Laterality: N/A;  no seeds found in bladder   PROSTATE BIOPSY     RADIOACTIVE SEED IMPLANT N/A 07/18/2015   Procedure: RADIOACTIVE SEED IMPLANT/BRACHYTHERAPY IMPLANT;  Surgeon: Kathie Rhodes, MD;  Location: [place];  Service: Urology;  Laterality: N/A;     48  seeds implanted    Prior to Admission medications   Medication Sig Start Date End Date Taking? Authorizing Provider  Cholecalciferol (VITAMIN D3) 1000 units CAPS Take 1 capsule by mouth daily.     [provider]  Cyanocobalamin (VITAMIN B-12) 5000 MCG SUBL Place under the tongue.    [provider]  ferrous sulfate 325 (65 FE) MG tablet Take 325 mg by mouth daily.     [provider]  HYDROcodone-acetaminophen (NORCO) 10-325 MG tablet Take 1-2 tablets by mouth every 4 (four) hours as needed for moderate pain. Maximum dose per 24 hours - 8 pills Patient not taking: Reported on 08/11/2015 07/18/15   Kathie Rhodes, MD  loperamide (IMODIUM) 2 MG capsule Take 2 mg by mouth as needed for diarrhea or loose stools.    [provider]  Multiple Vitamin (MULTI-VITAMINS) TABS Take 1 tablet by mouth daily.     [provider]  omeprazole (PRILOSEC) 40 MG capsule  07/24/15   [provider]  QUEtiapine Fumarate (SEROQUEL XR) 150 MG 24 hr tablet Take 150 mg by mouth at bedtime.  01/06/15   [provider]  sertraline (ZOLOFT) 100 MG tablet  07/28/15   [provider]  silodosin (RAPAFLO) 8 MG CAPS capsule Take 8 mg by mouth daily with breakfast.    [provider]  SUMAtriptan (IMITREX) 100 MG tablet  06/20/15   [provider]  tiZANidine (ZANAFLEX) 4 MG capsule Take 4 mg by mouth at bedtime as needed for muscle spasms.    [provider]  vitamin C (ASCORBIC ACID) 500 MG tablet Take 500 mg by mouth daily.     [provider]     Allergies Patient has no known allergies.  Family History  Problem Relation Age of Onset   Cancer Mother        brain tumor   Multiple sclerosis Mother    Diabetes Sister    Cancer Sister        lung    Social History Social History   Tobacco Use   Smoking status: Never   Smokeless tobacco: Never  Substance Use Topics   Alcohol use: Yes    Comment: occasional beer/wine    Drug use: No      Review of Systems  Unable to get review of systems due to patient's altered mental status _______________________   PHYSICAL EXAM:  VITAL SIGNS: ED Triage Vitals  Enc Vitals Group     BP 07/29/20 0000 (!) 186/88     Pulse Rate 07/28/20 2353 (!) 44     Resp 07/29/20 0000 (!) 22     Temp --      Temp src --      SpO2 07/29/20 0000 96 %     Weight 07/28/20 2358 190 lb (86.2 kg)     Height 07/28/20 2358 6\' 3"  (1.905 m)     Head Circumference --      Peak Flow --      Pain Score 07/28/20 2357 0     Pain Loc --      Pain Edu? --      Excl. in [place]? --     Constitutional: Patient is altered, unresponsive Eyes:  right pupil larger than left pupil unreactive Head: Some mild blood on nose Nose: No congestion/rhinnorhea.  Minimal bleeding noted from nose Mouth/Throat: Mucous membranes are moist.   Neck: No stridor. Trachea Midline. FROM Cardiovascular: bradycardiac. Grossly normal heart sounds.  Good peripheral circulation. Respiratory: Normal respiratory effort.  No retractions. Lungs CTAB. Gastrointestinal: Soft and nontender. No distention. No abdominal bruits.  Musculoskeletal: No lower extremity tenderness nor edema.  No joint effusions. Neurologic:  non verbal, withdraw pain in right leg otherwise non responsive  Skin:  Skin is warm, dry and intact. No rash noted. Psychiatric: unable to assess  GU: Deferred   ____________________________________________   LABS (all labs ordered are listed, but only abnormal results are displayed)  Labs Reviewed  RESP PANEL  BY RT-PCR (FLU A&B, COVID) ARPGX2  ETHANOL  PROTIME-INR  APTT  CBC  DIFFERENTIAL  COMPREHENSIVE METABOLIC PANEL  URINE DRUG SCREEN, QUALITATIVE ([facility] ONLY)  URINALYSIS, ROUTINE W REFLEX MICROSCOPIC  TROPONIN I (HIGH SENSITIVITY)   ____________________________________________   ED ECG REPORT I, Vanessa Glasford, the attending physician, personally viewed and interpreted this ECG.  Sinus rate of 77, no ST elevation, T wave version 2 3 aVF V3 through V6, normal intervals ____________________________________________  RADIOLOGY  Robert Bellow, personally viewed and evaluated these images (plain radiographs) as part of my medical decision making, as well as reviewing the written report by the radiologist.  ED MD interpretation:  large subdural   Official radiology report(s): DG Chest Portable 1 View  Result Date: 07/29/2020 CLINICAL DATA:  Altered mental status, unwitnessed fall EXAM: PORTABLE CHEST 1 VIEW COMPARISON:  Radiograph 06/01/2015 FINDINGS: Low lung volumes with some atelectatic changes mild pulmonary vascular congestion with some hazy interstitial opacities as well as fissural and septal thickening. No pneumothorax. No effusion. The aorta is calcified. The remaining cardiomediastinal contours are unremarkable. Degenerative changes are present in the imaged spine and shoulders. No visible displaced rib fracture or other acute traumatic finding of the chest or imaged shoulders. IMPRESSION: 1. Low lung volumes and atelectasis. 2. Pulmonary vascular congestion with hazy interstitial opacities which could reflect mild interstitial edematous change. Electronically Signed   By: Lovena Le M.D.   On: 07/29/2020 00:26   CT HEAD CODE STROKE WO CONTRAST  Result Date: 07/29/2020 CLINICAL DATA:  Code stroke. Initial evaluation for neuro deficit, stroke suspected. EXAM: CT HEAD WITHOUT CONTRAST TECHNIQUE: Contiguous axial images were obtained from the base of the skull through the vertex without  intravenous contrast. COMPARISON:  Prior MRI from 05/24/2019. FINDINGS: Brain: Mixed density holo hemispheric subdural hematoma overlying the right cerebral hemisphere measures up to 2 cm in thickness. Associated mass effect on the subjacent right cerebral hemisphere with up to 1.3 cm of right-to-left shift. Subdural blood extends along the falx as well. Small volume extra-axial hemorrhage noted overlies the left frontal and parietal convexities as well, measuring up to 7 mm in maximal thickness without significant mass effect. Scattered subarachnoid hemorrhage present at the right frontotemporal region involving the right sylvian fissure. Probable subarachnoid blood noted involving the anterior inferior frontal lobes, most pronounced at the right gyrus rectus (series 1, image 11). Additional trace subarachnoid blood noted along the falx. No acute large vessel territory infarct. No mass lesion. Few small periventricular calcifications noted. Partial effacement of the right lateral ventricle without overt ventricular trapping or hydrocephalus at this time. Basilar cisterns remain patent. Vascular: No visible hyperdense vessel. Scattered vascular calcifications noted within the carotid siphons. Skull: Suspected subtle soft tissue contusion at the right parietal scalp. Acute nondisplaced, nondepressed fracture seen involving the left occipital calvarium extending towards the skull base. Additional acute right occipital skull fracture present as well (series 4, image 34). There is an acute predominantly longitudinal E oriented right temporal bone fracture (series 4, image 28). Sinuses/Orbits: Globes and orbital soft tissues within normal limits paranasal sinuses are largely clear. Right mastoid and middle ear opacification, likely blood products related to the overlying temporal bone fracture. Left mastoid air cells clear. Other: None. ASPECTS [facility] Stroke Program Early CT Score) Acute intracranial hemorrhage, does  not apply. IMPRESSION: 1. Mixed density holo hemispheric subdural hematoma overlying the right cerebral hemisphere, measuring up to 2 cm in thickness. Associated mass effect with up to 1.3 cm of right-to-left midline shift. No hydrocephalus or ventricular trapping at this time. 2. Small volume extra-axial hemorrhage overlying the anterior left cerebral convexity, measuring up to 7 mm in thickness without significant mass effect. 3. Scattered subarachnoid hemorrhage as above, most pronounced at the right frontotemporal region/right sylvian fissure. 4. Acute nondisplaced right temporal bone fracture with associated right mastoid and middle ear effusion. 5. Acute nondisplaced, nondepressed left and right occipital skull fractures. Critical Value/emergent results were called by telephone at the time  of interpretation on 07/29/2020 at 12:22 am to provider Dr. Jari Pigg, Who verbally acknowledged these results. Electronically Signed   By: Jeannine Boga M.D.   On: 07/29/2020 00:27    ____________________________________________   PROCEDURES  Procedure(s) performed (including Critical Care):  .Critical Care  Date/Time: 07/29/2020 12:32 AM Performed by: Vanessa [place], MD Authorized by: Vanessa [place], MD   Critical care provider statement:    Critical care time (minutes):  75   Critical care was necessary to treat or prevent imminent or life-threatening deterioration of the following conditions:  CNS failure or compromise   Critical care was time spent personally by me on the following activities:  Discussions with consultants, evaluation of patient's response to treatment, examination of patient, ordering and performing treatments and interventions, ordering and review of laboratory studies, ordering and review of radiographic studies, pulse oximetry, re-evaluation of patient's condition, obtaining history from patient or surrogate and review of old charts .1-3 Lead EKG Interpretation  Date/Time: 07/29/2020  12:34 AM Performed by: Vanessa [place], MD Authorized by: Vanessa , MD     Interpretation: abnormal     ECG rate:  40s   ECG rate assessment: bradycardic     Rhythm: sinus bradycardia     Ectopy: none     Conduction: normal     ____________________________________________   INITIAL IMPRESSION / ASSESSMENT AND PLAN / ED COURSE  Josealberto Montalto was evaluated in Emergency Department on 07/29/2020 for the symptoms described in the history of present illness. He was evaluated in the context of the global COVID-19 pandemic, which necessitated consideration that the patient might be at risk for infection with the SARS-CoV-2 virus that causes COVID-19. Institutional protocols and algorithms that pertain to the evaluation of patients at risk for COVID-19 are in a state of rapid change based on information released by regulatory bodies including the CDC and federal and state organizations. These policies and algorithms were followed during the patient's care in the ED.    Patient was initially seen by Dr. Tamala Julian noted to be bradycardic and unresponsive with blown pupil.  Patient was given atropine and IV epinephrine.  Heart rates went up into the 150s and patient was taken to the CT scanner given concern for possible subdural versus epidural hematoma, cervical fracture, skull fracture.  Stroke code was initially called by him but canceled after revealing a subdural hematoma with skull fractures.  Lengthy discussion was had with patient's wife who stated that patient was DNR and would not want interventions to be kept alive if it was not a meaningful recovery.  CT imaging does show a very large subdural with significant midline shift with skull fractures.  I discussed the case with Dr. Izora Ribas who stated that he could do a intervention but patient's chance of independent outcome was very low.  Patient had already shown signs of herniation upon arrival with blown pupil, bradycardia, unresponsiveness.  I  discussed this further with patient's wife who stated that he would not want to live in a dependent state and have opted comfort care for patient.  Patient expressed very strongly his wishes to wife.  Patient was given a dose of mannitol prior to this decision.  Patient was placed as a DNR and will admit to the hospital for comfort care  I did discuss with patient's brother who is a medicine as well what happened over the phone  3:41 AM patient started talking and I reassessed patient and he is moving all extremities except  for the left leg although he still has sensation intact.  I rediscussed with neurosurgery who plans to take patient to the OR for craniotomy.  Patient will need ICU afterwards.  I did update the hospitalist of this change.       ____________________________________________   FINAL CLINICAL IMPRESSION(S) / ED DIAGNOSES   Final diagnoses:  Subdural hematoma ([place])  Closed fracture of skull, unspecified bone, initial encounter ([place])  Fall, initial encounter      MEDICATIONS GIVEN DURING THIS VISIT:  Medications  EPINEPHrine (ADRENALIN) 1 mg (1 mg Intravenous Given 07/28/20 2354)  atropine 1 MG/10ML injection 1 mg (1 mg Intravenous Given 07/28/20 2353)  mannitol 20 % infusion 75 g (0 g Intravenous Stopped 07/29/20 0149)  iohexol (OMNIPAQUE) 350 MG/ML injection 75 mL (75 mLs Intravenous Contrast Given 07/29/20 0039)     ED Discharge Orders     None        Note:  This document was prepared using Dragon voice recognition software and may include unintentional dictation errors.    Vanessa [place], MD 07/29/20 0205    Vanessa [place], MD 07/29/20 5848    Vanessa Jeannette, MD 07/29/20 [phone]

## 2020-07-31 ENCOUNTER — Encounter: Payer: Self-pay | Admitting: Neurosurgery

## 2020-08-02 ENCOUNTER — Other Ambulatory Visit (HOSPITAL_COMMUNITY): Payer: Self-pay | Admitting: Neurosurgery

## 2020-08-02 ENCOUNTER — Other Ambulatory Visit: Payer: Self-pay | Admitting: Neurosurgery

## 2020-08-02 DIAGNOSIS — S065XAA Traumatic subdural hemorrhage with loss of consciousness status unknown, initial encounter: Secondary | ICD-10-CM

## 2020-08-02 DIAGNOSIS — Z9889 Other specified postprocedural states: Secondary | ICD-10-CM

## 2020-08-02 DIAGNOSIS — S065X9A Traumatic subdural hemorrhage with loss of consciousness of unspecified duration, initial encounter: Secondary | ICD-10-CM

## 2020-08-10 ENCOUNTER — Other Ambulatory Visit: Payer: Self-pay | Admitting: Neurosurgery

## 2020-08-10 DIAGNOSIS — S065X9A Traumatic subdural hemorrhage with loss of consciousness of unspecified duration, initial encounter: Secondary | ICD-10-CM

## 2020-08-10 DIAGNOSIS — S065XAA Traumatic subdural hemorrhage with loss of consciousness status unknown, initial encounter: Secondary | ICD-10-CM

## 2020-08-28 DIAGNOSIS — G40909 Epilepsy, unspecified, not intractable, without status epilepticus: Secondary | ICD-10-CM | POA: Insufficient documentation

## 2020-08-29 ENCOUNTER — Ambulatory Visit: Payer: Federal, State, Local not specified - PPO

## 2020-08-30 ENCOUNTER — Other Ambulatory Visit: Payer: Self-pay

## 2020-08-30 ENCOUNTER — Ambulatory Visit
Admission: RE | Admit: 2020-08-30 | Discharge: 2020-08-30 | Disposition: A | Discharge status: Home / Self Care | Payer: Federal, State, Local not specified - PPO | Source: Ambulatory Visit | Attending: Neurosurgery | Admitting: Neurosurgery

## 2020-08-30 DIAGNOSIS — S065XAA Traumatic subdural hemorrhage with loss of consciousness status unknown, initial encounter: Secondary | ICD-10-CM

## 2020-08-30 DIAGNOSIS — S065X9A Traumatic subdural hemorrhage with loss of consciousness of unspecified duration, initial encounter: Secondary | ICD-10-CM | POA: Diagnosis not present

## 2020-08-30 IMAGING — CT CT HEAD W/O CM
3 series · 16 of 47 positions shown, 19 images · non-contrast
Comparison: CT head [DATE]

CLINICAL DATA: Follow-up subdural hematoma

EXAM:
CT HEAD WITHOUT CONTRAST
TECHNIQUE: Contiguous axial images were obtained from the base of the skull
through the vertex without intravenous contrast.

[Series 2: head wo · axial · 0.46mm/px · z∈[+341,+491]mm · 10 of 36 slices shown, 13 images]
[im 3/36  brain]
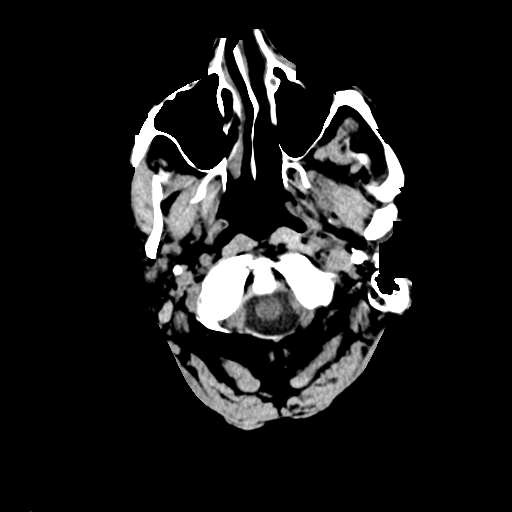
[im 3/36  bone]
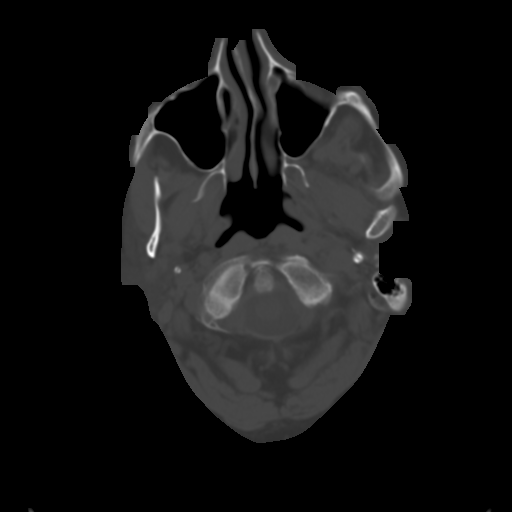
[im 7/36  brain]
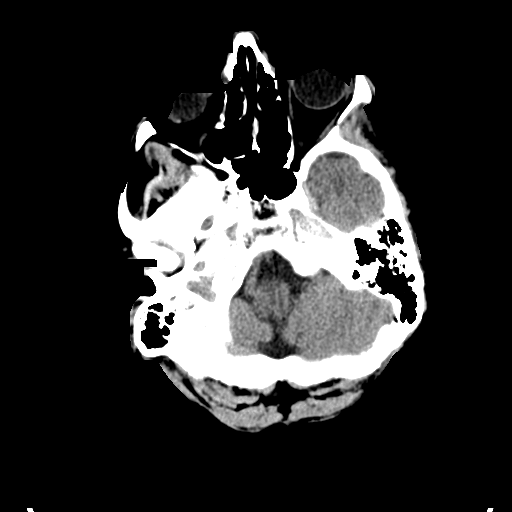
[im 10/36  brain]
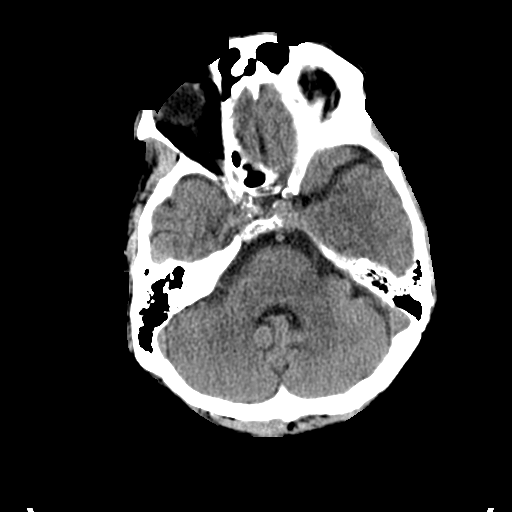
[im 13/36  brain]
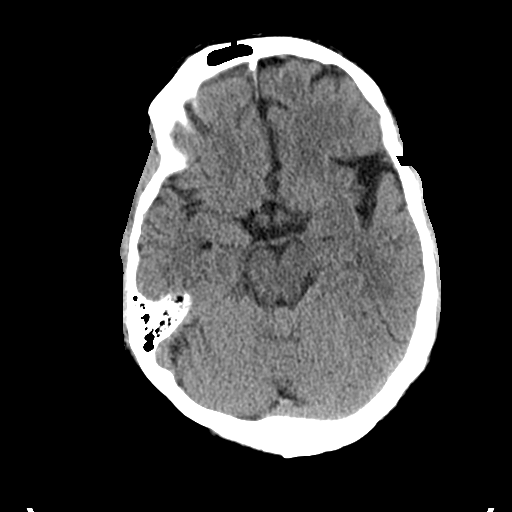
[im 16/36  brain]
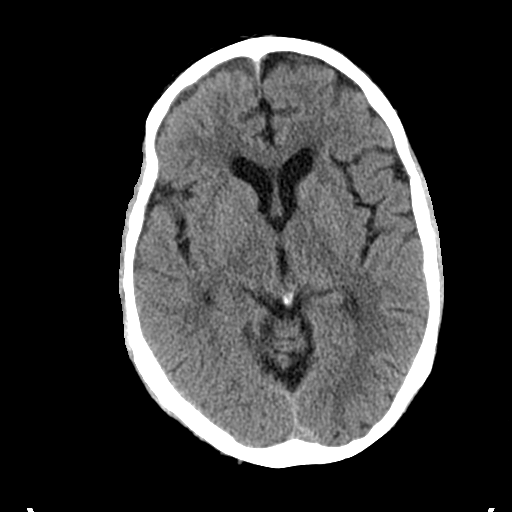
[im 16/36  bone]
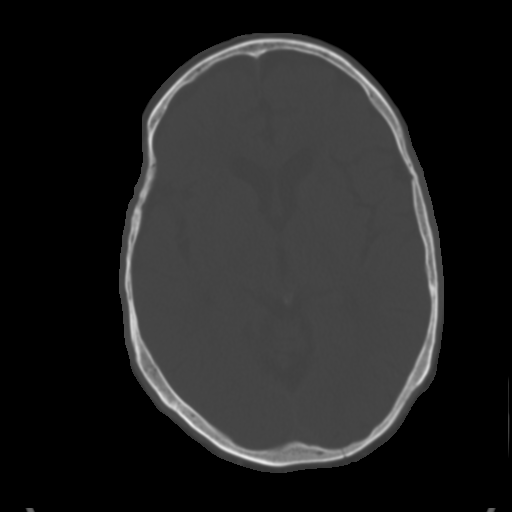
[im 20/36  brain]
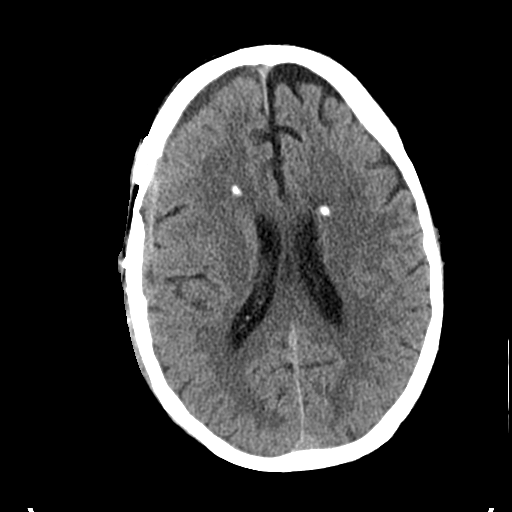
[im 23/36  brain]
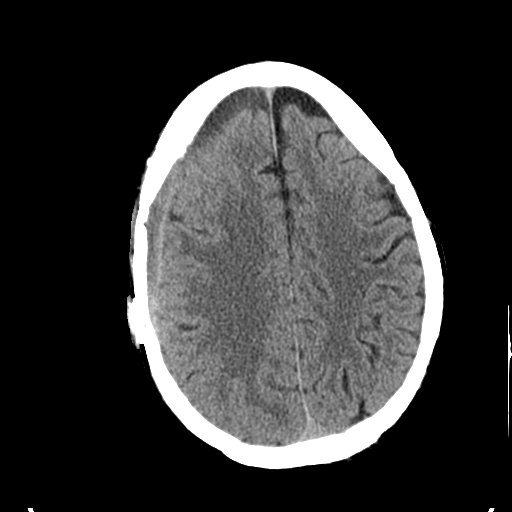
[im 27/36  brain]
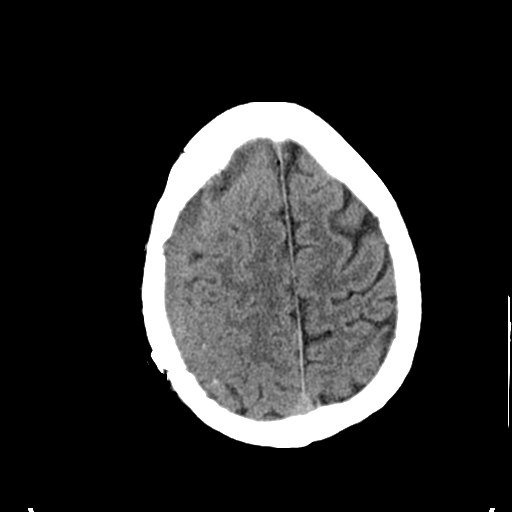
[im 29/36  brain]
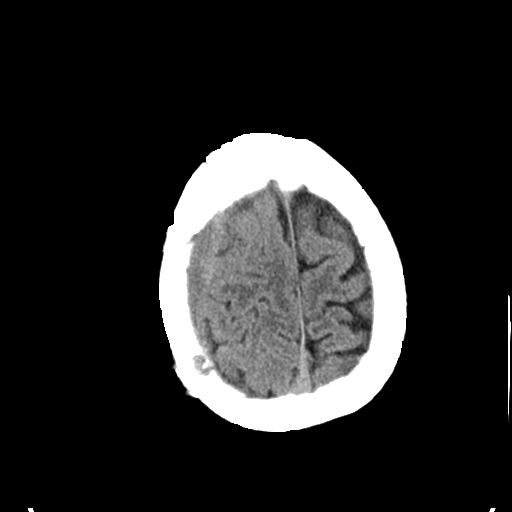
[im 29/36  bone]
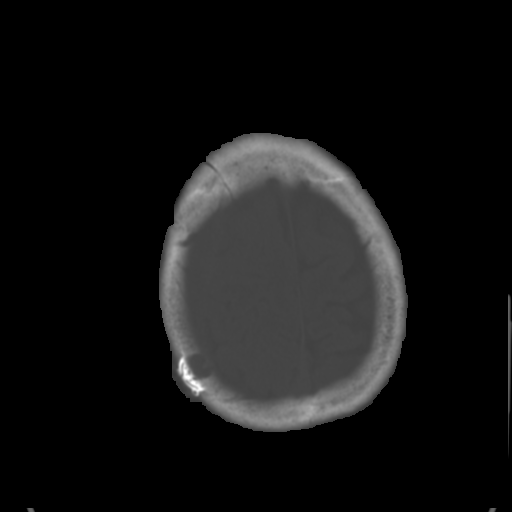
[im 33/36  brain]
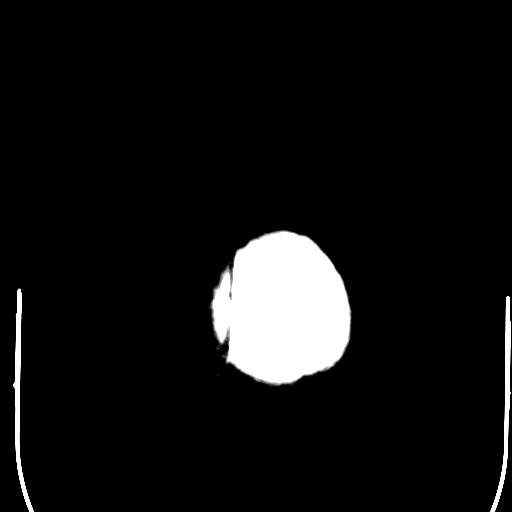

[Series 4: coronal soft tissue · coronal · 0.36mm/px · 3 of 74 slices shown]
[im 25/74  brain]
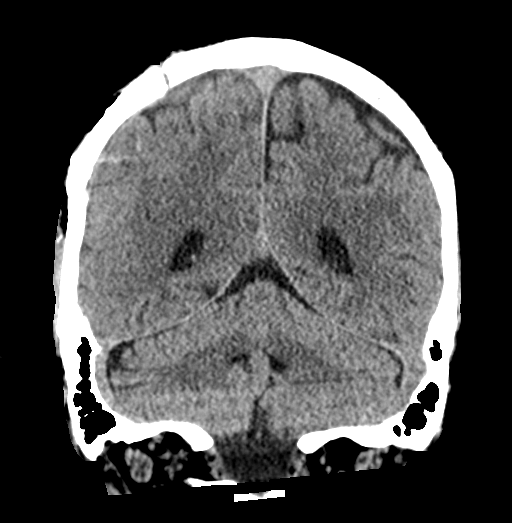
[im 33/74  brain]
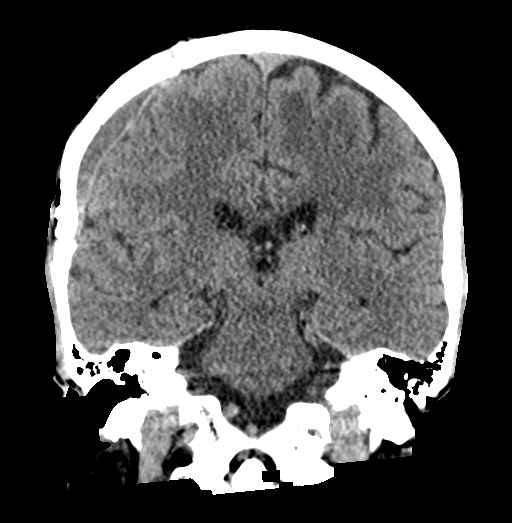
[im 41/74  brain]
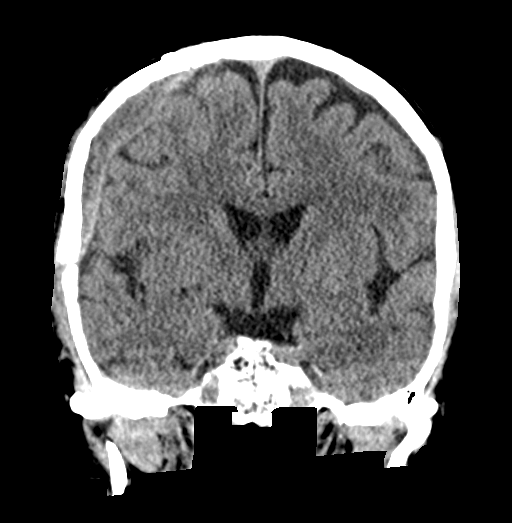

[Series 5: sagittal soft tissue · sagittal · 0.38mm/px · 3 of 59 slices shown]
[im 20/59  brain]
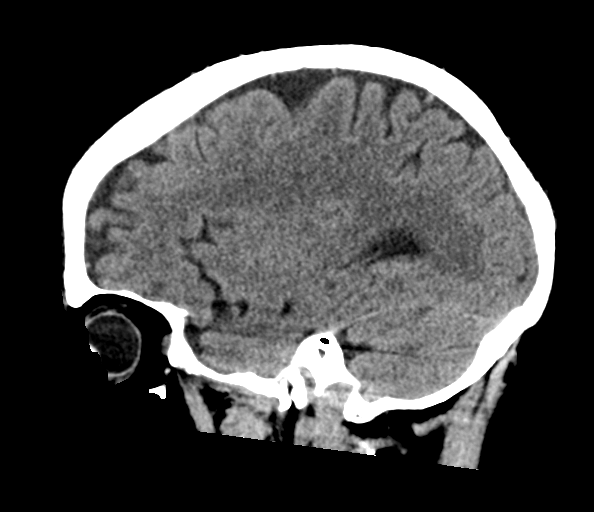
[im 30/59  brain]
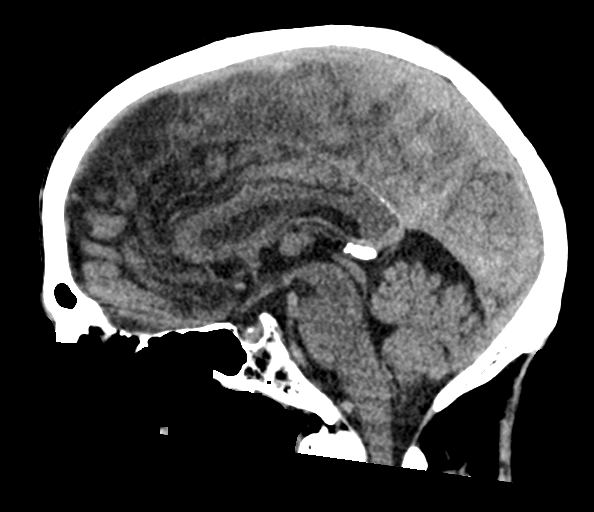
[im 39/59  brain]
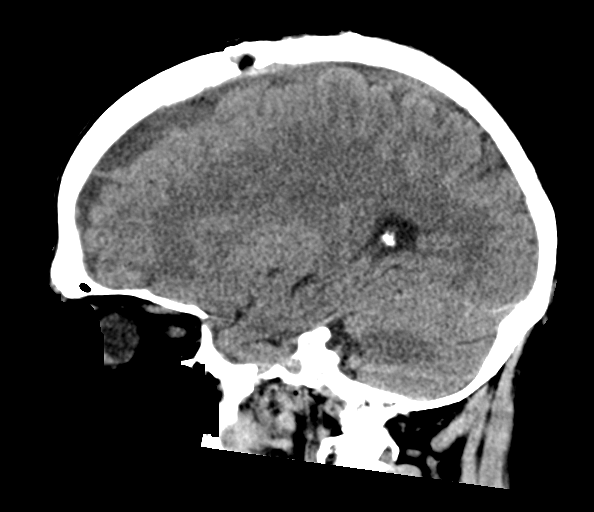

[16 of 47 positions shown; findings below may reference images not displayed]

FINDINGS: Brain: Interval right pterional craniotomy for subdural hematoma
drainage. Residual intermediate to low-density subdural fluid
collection on the right measures up to 9 mm in thickness. Fluid
collection appears both subdural and extradural. Significant
improvement in mass-effect on the right hemisphere. No midline
shift. No new high-density hemorrhage

5 mm calcifications in the frontal white matter bilaterally
unchanged from the prior CT. No surrounding edema. Negative for
acute infarction.

Vascular: Negative for hyperdense vessel

Skull: Right pterional craniotomy.

Sinuses/Orbits: Paranasal sinuses clear.  No orbital lesion.

Other: None
IMPRESSION: Postop craniotomy on the right for subdural hematoma drainage. 9 mm
predominately low density subdural fluid collection remains in the
right. No high density blood. No midline shift.

## 2020-08-31 ENCOUNTER — Other Ambulatory Visit: Payer: Self-pay | Admitting: Neurosurgery

## 2020-09-02 ENCOUNTER — Emergency Department
Admission: EM | Admit: 2020-09-02 | Discharge: 2020-09-02 | Disposition: A | Discharge status: Home / Self Care | Payer: Federal, State, Local not specified - PPO | Attending: Emergency Medicine | Admitting: Emergency Medicine

## 2020-09-02 ENCOUNTER — Other Ambulatory Visit: Payer: Self-pay

## 2020-09-02 DIAGNOSIS — M25511 Pain in right shoulder: Secondary | ICD-10-CM | POA: Diagnosis not present

## 2020-09-02 DIAGNOSIS — Z923 Personal history of irradiation: Secondary | ICD-10-CM | POA: Diagnosis not present

## 2020-09-02 DIAGNOSIS — Z79899 Other long term (current) drug therapy: Secondary | ICD-10-CM | POA: Insufficient documentation

## 2020-09-02 DIAGNOSIS — Z8546 Personal history of malignant neoplasm of prostate: Secondary | ICD-10-CM | POA: Insufficient documentation

## 2020-09-02 MED ORDER — OXYCODONE-ACETAMINOPHEN 5-325 MG PO TABS: 1.0000 | ORAL_TABLET | Freq: Four times a day (QID) | ORAL | 0 refills | Status: AC | PRN

## 2020-09-02 MED ORDER — OXYCODONE-ACETAMINOPHEN 5-325 MG PO TABS
1.0000 | ORAL_TABLET | Freq: Once | ORAL | Status: AC
Start: 2020-09-02 — End: 2020-09-02
  Administered 2020-09-02: 1 via ORAL
  Filled 2020-09-02: qty 1

## 2020-09-02 NOTE — ED Provider Notes (Signed)
[facility]EMERGENCY DEPARTMENT  ____________________________________________  Time seen: Approximately 10:23 PM  I have reviewed the triage vital signs and the nursing notes.   HISTORY  Chief Complaint Shoulder Pain (C/o right shoulder and upper back pain x2 weeks. Pt. States he has been diagnosed with pinched nerve in his neck, and is scheduled for surgery next week. '800mg'$  advil 1 hour pta.)   Historian Patient     HPI Charles Marks is a 65 y.o. male presents to the emergency department for pain management.  Patient anticipates having a C5-C6 cervical decompression with discectomy and fusion on 09/13/2020 by patient's neurosurgeon, Dr. Cari Caraway.  Patient states that he was initially prescribed Percocet which has helped with his pain but reports that he is out of his medication and is requesting a refill.  He states that he has been unable to sleep due to the pain.  No chest pain, chest tightness or abdominal pain.  No other alleviating measures have been attempted.   Past Medical History:  Diagnosis Date   Depression    Frequency of urination    since radiation tx   GERD (gastroesophageal reflux disease)    Headache    History of traumatic head injury    age 66--  head injury w/ fractured skull in coma for 3 days made full recovery withou further treatment--  no residual   Nocturia    Prostate cancer [facility]) urologist-  dr ottelin/  oncologist- dr [name]   T1c, Gleason 4+4,  PSA 14.24,  vol 25.6cc/  IM radiation therapy (05-23-2015 to 06-27-2015)     S/P radiation therapy 05-23-2015  to  06-27-2015   prostatic fossa  68.4 Gy in 38 fractions in 1.8 Gy   Type 2 diabetes, diet controlled ([place])    Ulcer    Wears glasses      Immunizations up to date:  Yes.     Past Medical History:  Diagnosis Date   Depression    Frequency of urination    since radiation tx   GERD (gastroesophageal reflux disease)    Headache    History of traumatic head injury    age 32--  head  injury w/ fractured skull in coma for 3 days made full recovery withou further treatment--  no residual   Nocturia    Prostate cancer [facility]) urologist-  dr ottelin/  oncologist- dr [name]   T1c, Gleason 4+4,  PSA 14.24,  vol 25.6cc/  IM radiation therapy (05-23-2015 to 06-27-2015)     S/P radiation therapy 05-23-2015  to  06-27-2015   prostatic fossa  68.4 Gy in 38 fractions in 1.8 Gy   Type 2 diabetes, diet controlled ([place])    Ulcer    Wears glasses     Patient Active Problem List   Diagnosis Date Noted   Unresponsiveness 07/29/2020   Subdural hematoma ([place]) 07/29/2020   Malignant neoplasm of prostate ([place]) 03/23/2015    Past Surgical History:  Procedure Laterality Date   APPENDECTOMY  1975   COLONOSCOPY  2012 approx   COLONOSCOPY WITH PROPOFOL N/A 04/29/2016   Procedure: COLONOSCOPY WITH PROPOFOL;  Surgeon: Manya Silvas, MD;  Location: [place];  Service: Endoscopy;  Laterality: N/A;   CRANIOTOMY Right 07/29/2020   Procedure: CRANIOTOMY HEMATOMA EVACUATION SUBDURAL;  Surgeon: Meade Maw, MD;  Location: [facility];  Service: Neurosurgery;  Laterality: Right;   CYSTOSCOPY N/A 07/18/2015   Procedure: CYSTOSCOPY FLEXIBLE;  Surgeon: Kathie Rhodes, MD;  Location: [facility];  Service: Urology;  Laterality:  N/A;  no seeds found in bladder   PROSTATE BIOPSY     RADIOACTIVE SEED IMPLANT N/A 07/18/2015   Procedure: RADIOACTIVE SEED IMPLANT/BRACHYTHERAPY IMPLANT;  Surgeon: Kathie Rhodes, MD;  Location: [place];  Service: Urology;  Laterality: N/A;     48  seeds implanted    Prior to Admission medications   Medication Sig Start Date End Date Taking? Authorizing Provider  oxyCODONE-acetaminophen (PERCOCET/ROXICET) 5-325 MG tablet Take 1 tablet by mouth every 6 (six) hours as needed for up to 5 days. 09/02/20 09/07/20 Yes Lannie Fields, PA-C  Cholecalciferol (VITAMIN D3) 1000 units CAPS Take 1 capsule by mouth daily.     [provider]   Multiple Vitamin (MULTI-VITAMINS) TABS Take 1 tablet by mouth daily.     [provider]  omeprazole (PRILOSEC) 40 MG capsule  07/24/15   [provider]  QUEtiapine (SEROQUEL XR) 200 MG 24 hr tablet Take 200 mg by mouth at bedtime. 01/06/15   [provider]  sertraline (ZOLOFT) 100 MG tablet Take 200 mg by mouth daily. 07/28/15   [provider]  SUMAtriptan (IMITREX) 100 MG tablet  06/20/15   [provider]    Allergies Patient has no known allergies.  Family History  Problem Relation Age of Onset   Cancer Mother        brain tumor   Multiple sclerosis Mother    Diabetes Sister    Cancer Sister        lung    Social History Social History   Tobacco Use   Smoking status: Never   Smokeless tobacco: Never  Substance Use Topics   Alcohol use: Yes    Comment: occasional beer/wine    Drug use: No     Review of Systems  Constitutional: No fever/chills Eyes:  No discharge ENT: No upper respiratory complaints. Respiratory: no cough. No SOB/ use of accessory muscles to breath Gastrointestinal:   No nausea, no vomiting.  No diarrhea.  No constipation. Musculoskeletal: Patient has right shoulder pain and right-sided neck pain. Skin: Negative for rash, abrasions, lacerations, ecchymosis.   ____________________________________________   PHYSICAL EXAM:  VITAL SIGNS: ED Triage Vitals  Enc Vitals Group     BP 09/02/20 2024 (!) 109/97     Pulse Rate 09/02/20 2024 95     Resp 09/02/20 2024 18     Temp 09/02/20 2024 97.8 F (36.6 C)     Temp Source 09/02/20 2024 Oral     SpO2 09/02/20 2024 98 %     Weight 09/02/20 2027 190 lb 0.6 oz (86.2 kg)     Height 09/02/20 2027 '6\' 3"'$  (1.905 m)     Head Circumference --      Peak Flow --      Pain Score 09/02/20 2025 10     Pain Loc --      Pain Edu? --      Excl. in [place]? --      Constitutional: Alert and oriented. Well appearing and in no acute distress. Eyes: Conjunctivae are  normal. PERRL. EOMI. Head: Atraumatic. ENT:      Nose: No congestion/rhinnorhea.      Mouth/Throat: Mucous membranes are moist.  Neck: No stridor.  No cervical spine tenderness to palpation. Cardiovascular: Normal rate, regular rhythm. Normal S1 and S2.  Good peripheral circulation. Respiratory: Normal respiratory effort without tachypnea or retractions. Lungs CTAB. Good air entry to the bases with no decreased or absent breath sounds Gastrointestinal: Bowel sounds  x 4 quadrants. Soft and nontender to palpation. No guarding or rigidity. No distention. Musculoskeletal: Full range of motion to all extremities. No obvious deformities noted Neurologic:  Normal for age. No gross focal neurologic deficits are appreciated.  Skin:  Skin is warm, dry and intact. No rash noted. Psychiatric: Mood and affect are normal for age. Speech and behavior are normal.   ____________________________________________   LABS (all labs ordered are listed, but only abnormal results are displayed)  Labs Reviewed - No data to display ____________________________________________  EKG   ____________________________________________  RADIOLOGY   No results found.  ____________________________________________    PROCEDURES  Procedure(s) performed:     Procedures     Medications  oxyCODONE-acetaminophen (PERCOCET/ROXICET) 5-325 MG per tablet 1 tablet (1 tablet Oral Given 09/02/20 2156)     ____________________________________________   INITIAL IMPRESSION / ASSESSMENT AND PLAN / ED COURSE  Pertinent labs & imaging results that were available during my care of the patient were reviewed by me and considered in my medical decision making (see chart for details).      Assessment and plan Right shoulder pain 65 year old male presents to the emergency department with acute right shoulder pain with impending surgical procedure on 09/13/2020.  Patient's Percocet was refilled until he can see Dr.  Cari Caraway.  All patient questions were answered.     ____________________________________________  FINAL CLINICAL IMPRESSION(S) / ED DIAGNOSES  Final diagnoses:  Acute pain of right shoulder      NEW MEDICATIONS STARTED DURING THIS VISIT:  ED Discharge Orders          Ordered    oxyCODONE-acetaminophen (PERCOCET/ROXICET) 5-325 MG tablet  Every 6 hours PRN        09/02/20 2148                This chart was dictated using voice recognition software/Dragon. Despite best efforts to proofread, errors can occur which can change the meaning. Any change was purely unintentional.     Lannie Fields, PA-C 09/02/20 2236    Lucrezia Starch, MD 09/02/20 [phone]

## 2020-09-02 NOTE — ED Triage Notes (Signed)
FIRST NURSE NOTE:  Pt c/o neck pain, scheduled for neck surgery on 8/24.

## 2020-09-02 NOTE — ED Triage Notes (Deleted)
Pt. States he has run out of oxycodone x5 days.

## 2020-09-02 NOTE — ED Triage Notes (Addendum)
C/o right shoulder and upper back pain x2 weeks. Pt. States he has been diagnosed with pinched nerve in his neck, and is scheduled for surgery next week. '800mg'$  advil 1 hour pta. Pt. Denies any new symptoms, states same pain, worsening. Pt. States he is out of his oxycodone.

## 2020-09-06 ENCOUNTER — Other Ambulatory Visit: Payer: Self-pay

## 2020-09-06 ENCOUNTER — Encounter
Admission: RE | Admit: 2020-09-06 | Discharge: 2020-09-06 | Disposition: A | Discharge status: Home / Self Care | Payer: Federal, State, Local not specified - PPO | Source: Ambulatory Visit | Attending: Neurosurgery | Admitting: Neurosurgery

## 2020-09-06 DIAGNOSIS — Z01812 Encounter for preprocedural laboratory examination: Secondary | ICD-10-CM | POA: Insufficient documentation

## 2020-09-06 LAB — TYPE AND SCREEN
ABO/RH(D): A NEG
Antibody Screen: NEGATIVE

## 2020-09-06 LAB — SURGICAL PCR SCREEN
MRSA, PCR: NEGATIVE
Staphylococcus aureus: NEGATIVE

## 2020-09-06 NOTE — Patient Instructions (Addendum)
Your procedure is scheduled on: Wednesday, August 24 Report to the Registration Desk on the 1st floor of the [facility]. To find out your arrival time, please call [phone] between 1PM - 3PM on: Tuesday, August 23  REMEMBER: Instructions that are not followed completely may result in serious medical risk, up to and including death; or upon the discretion of your surgeon and anesthesiologist your surgery may need to be rescheduled.  Do not eat food after midnight the night before surgery.  No gum chewing, lozengers or hard candies.  You may however, drink CLEAR liquids up to 2 hours before you are scheduled to arrive for your surgery. Do not drink anything within 2 hours of your scheduled arrival time.  Clear liquids include: - water  - apple juice without pulp - gatorade (not RED, PURPLE, OR BLUE) - black coffee or tea (Do NOT add milk or creamers to the coffee or tea) Do NOT drink anything that is not on this list.  TAKE THESE MEDICATIONS THE MORNING OF SURGERY WITH A SIP OF WATER:  Atorvastatin Gabapentin Levetiracetam (Keppra) Omeprazole (Prilosec) - (take one the night before and one on the morning of surgery - helps to prevent nausea after surgery.) Oxycodone if needed for pain Sertraline (Zoloft) Verapamil   One week prior to surgery: starting August 17 Stop Anti-inflammatories (NSAIDS) such as Advil, Aleve, Ibuprofen, Motrin, Naproxen, Naprosyn and Aspirin based products such as Excedrin, Goodys Powder, BC Powder. Stop ANY OVER THE COUNTER supplements until after surgery. Stop calcium, vitamin D, multiple vitamin You may however, continue to take Tylenol if needed for pain up until the day of surgery.  Do NOT take aspirin 1 week prior to surgery and do not resume aspirin until 2 weeks after surgery.  No Alcohol for 24 hours before or after surgery.  On the morning of surgery brush your teeth with toothpaste and water, you may rinse your mouth with mouthwash if you  wish. Do not swallow any toothpaste or mouthwash.  Do not wear jewelry.  Do not wear lotions, powders, or perfumes.   Do not shave body from the neck down 48 hours prior to surgery just in case you cut yourself which could leave a site for infection.  Also, freshly shaved skin may become irritated if using the CHG soap.  Contact lenses, hearing aids and dentures may not be worn into surgery.  Do not bring valuables to the hospital. [facility] is not responsible for any missing/lost belongings or valuables.   Use CHG Soap as directed on instruction sheet.  Notify your doctor if there is any change in your medical condition (cold, fever, infection).  Wear comfortable clothing (specific to your surgery type) to the hospital.  After surgery, you can help prevent lung complications by doing breathing exercises.  Take deep breaths and cough every 1-2 hours. Your doctor may order a device called an Incentive Spirometer to help you take deep breaths.  If you are being discharged the day of surgery, you will not be allowed to drive home. You will need a responsible adult (18 years or older) to drive you home and stay with you that night.   If you are taking public transportation, you will need to have a responsible adult (18 years or older) with you. Please confirm with your physician that it is acceptable to use public transportation.   Please call the [place] Dept. at [phone] if you have any questions about these instructions.  Surgery Visitation  Policy:  Patients undergoing a surgery or procedure may have one family member or support person with them as long as that person is not COVID-19 positive or experiencing its symptoms.  That person may remain in the waiting area during the procedure.

## 2020-09-06 NOTE — Pre-Procedure Instructions (Signed)
Copy and pasted from 07/29/2020 ED note:  ED ECG REPORT I, Vanessa Framingham, the attending physician, personally viewed and interpreted this ECG.   Sinus rate of 77, no ST elevation, T wave version 2 3 aVF V3 through V6, normal intervals

## 2020-09-13 ENCOUNTER — Ambulatory Visit: Payer: Federal, State, Local not specified - PPO | Admitting: Certified Registered"

## 2020-09-13 ENCOUNTER — Ambulatory Visit: Payer: Federal, State, Local not specified - PPO

## 2020-09-13 ENCOUNTER — Ambulatory Visit
Admission: RE | Admit: 2020-09-13 | Discharge: 2020-09-13 | Disposition: A | Discharge status: Home / Self Care | Payer: Federal, State, Local not specified - PPO | Attending: Neurosurgery | Admitting: Neurosurgery

## 2020-09-13 ENCOUNTER — Ambulatory Visit: Payer: Federal, State, Local not specified - PPO | Admitting: Urgent Care

## 2020-09-13 ENCOUNTER — Encounter: Payer: Self-pay | Admitting: Neurosurgery

## 2020-09-13 ENCOUNTER — Other Ambulatory Visit: Payer: Self-pay

## 2020-09-13 ENCOUNTER — Encounter
Admission: RE | Disposition: A | Discharge status: Home / Self Care | Payer: Self-pay | Source: Home / Self Care | Attending: Neurosurgery

## 2020-09-13 DIAGNOSIS — M5412 Radiculopathy, cervical region: Secondary | ICD-10-CM | POA: Diagnosis not present

## 2020-09-13 DIAGNOSIS — I6200 Nontraumatic subdural hemorrhage, unspecified: Secondary | ICD-10-CM | POA: Insufficient documentation

## 2020-09-13 DIAGNOSIS — R531 Weakness: Secondary | ICD-10-CM | POA: Diagnosis not present

## 2020-09-13 DIAGNOSIS — Z419 Encounter for procedure for purposes other than remedying health state, unspecified: Secondary | ICD-10-CM

## 2020-09-13 HISTORY — PX: ANTERIOR CERVICAL DECOMP/DISCECTOMY FUSION: SHX1161

## 2020-09-13 LAB — GLUCOSE, CAPILLARY
Glucose-Capillary: 134 mg/dL — ABNORMAL HIGH (ref 70–99)
Glucose-Capillary: 138 mg/dL — ABNORMAL HIGH (ref 70–99)

## 2020-09-13 IMAGING — RF DG C-ARM 1-60 MIN
1 series · 3 of 3 positions shown · non-contrast
Comparison: None.

CLINICAL DATA: C5-6 ACDF

EXAM:
CERVICAL SPINE - 2-3 VIEW; DG C-ARM 1-60 MIN

[Series 1: dg x-ray · 0.20mm/px · 3 of 3 slices shown]
[im 1/3]
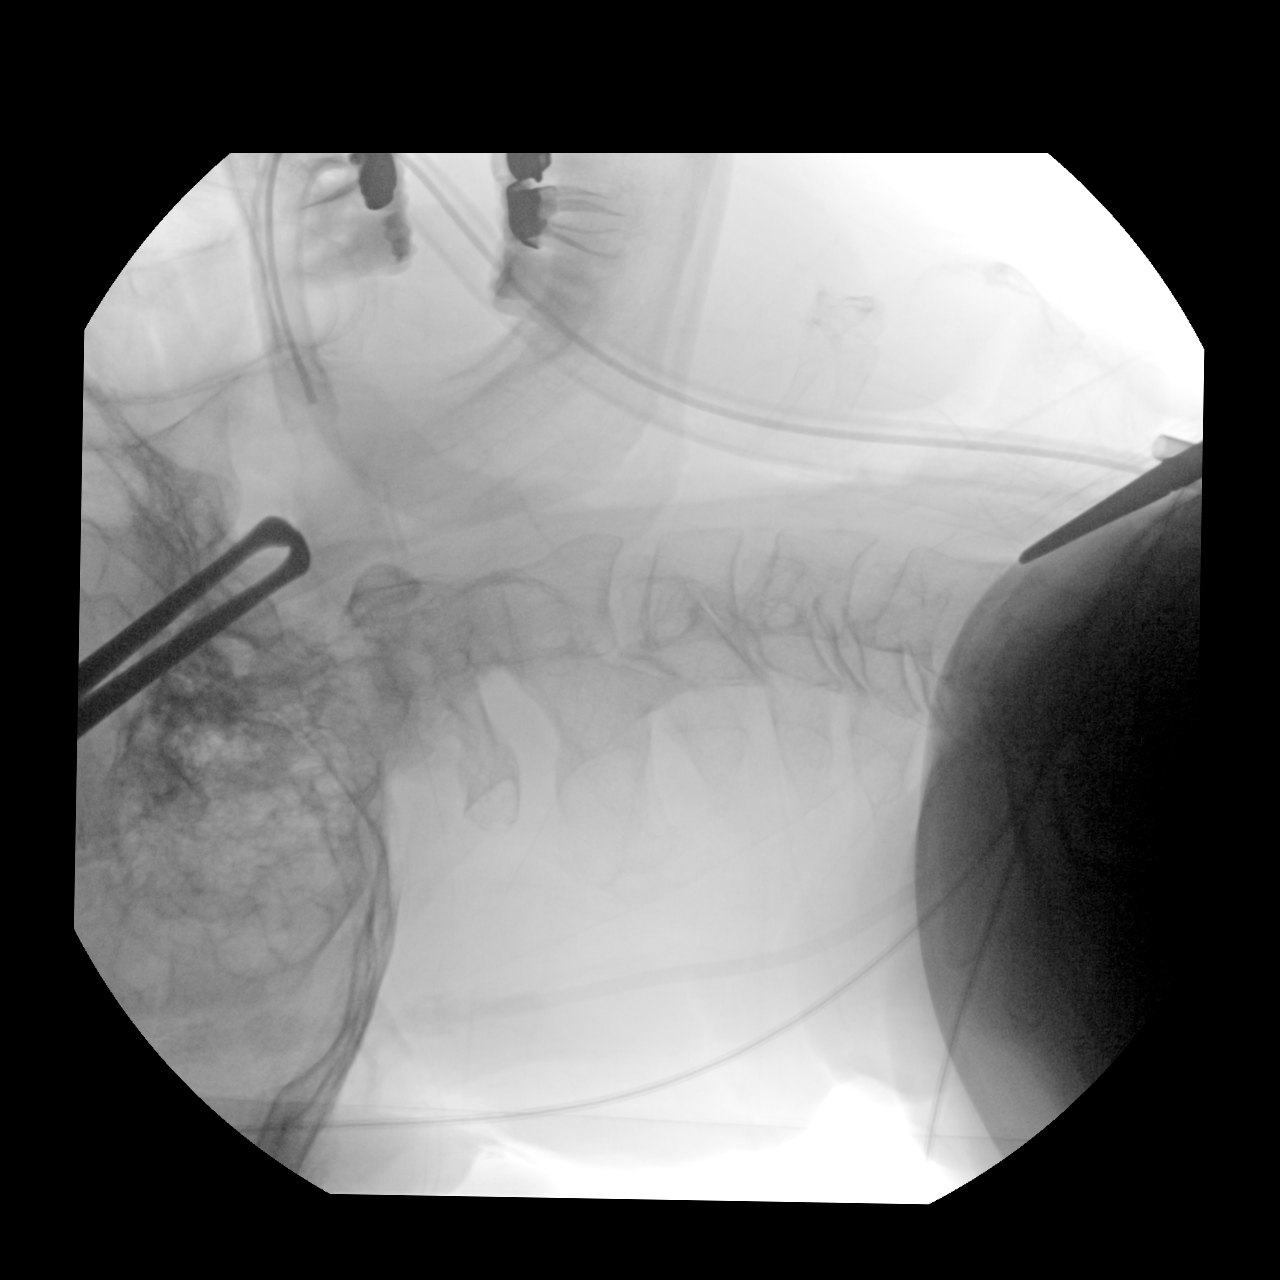
[im 2/3]
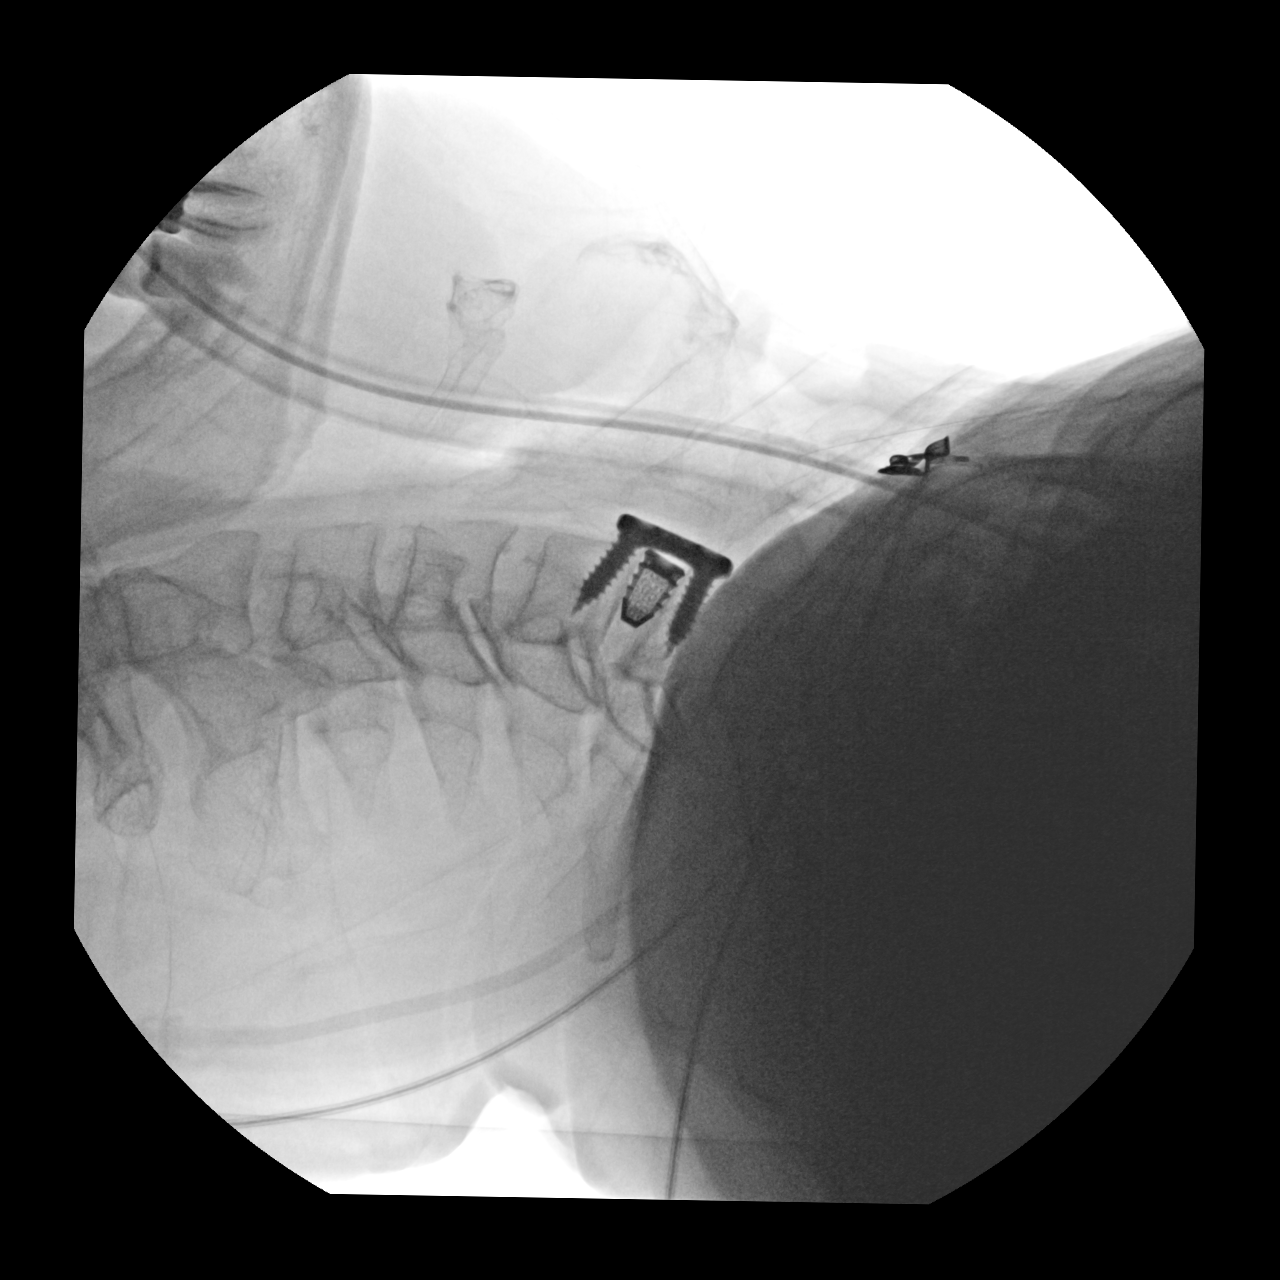
[im 3/3]
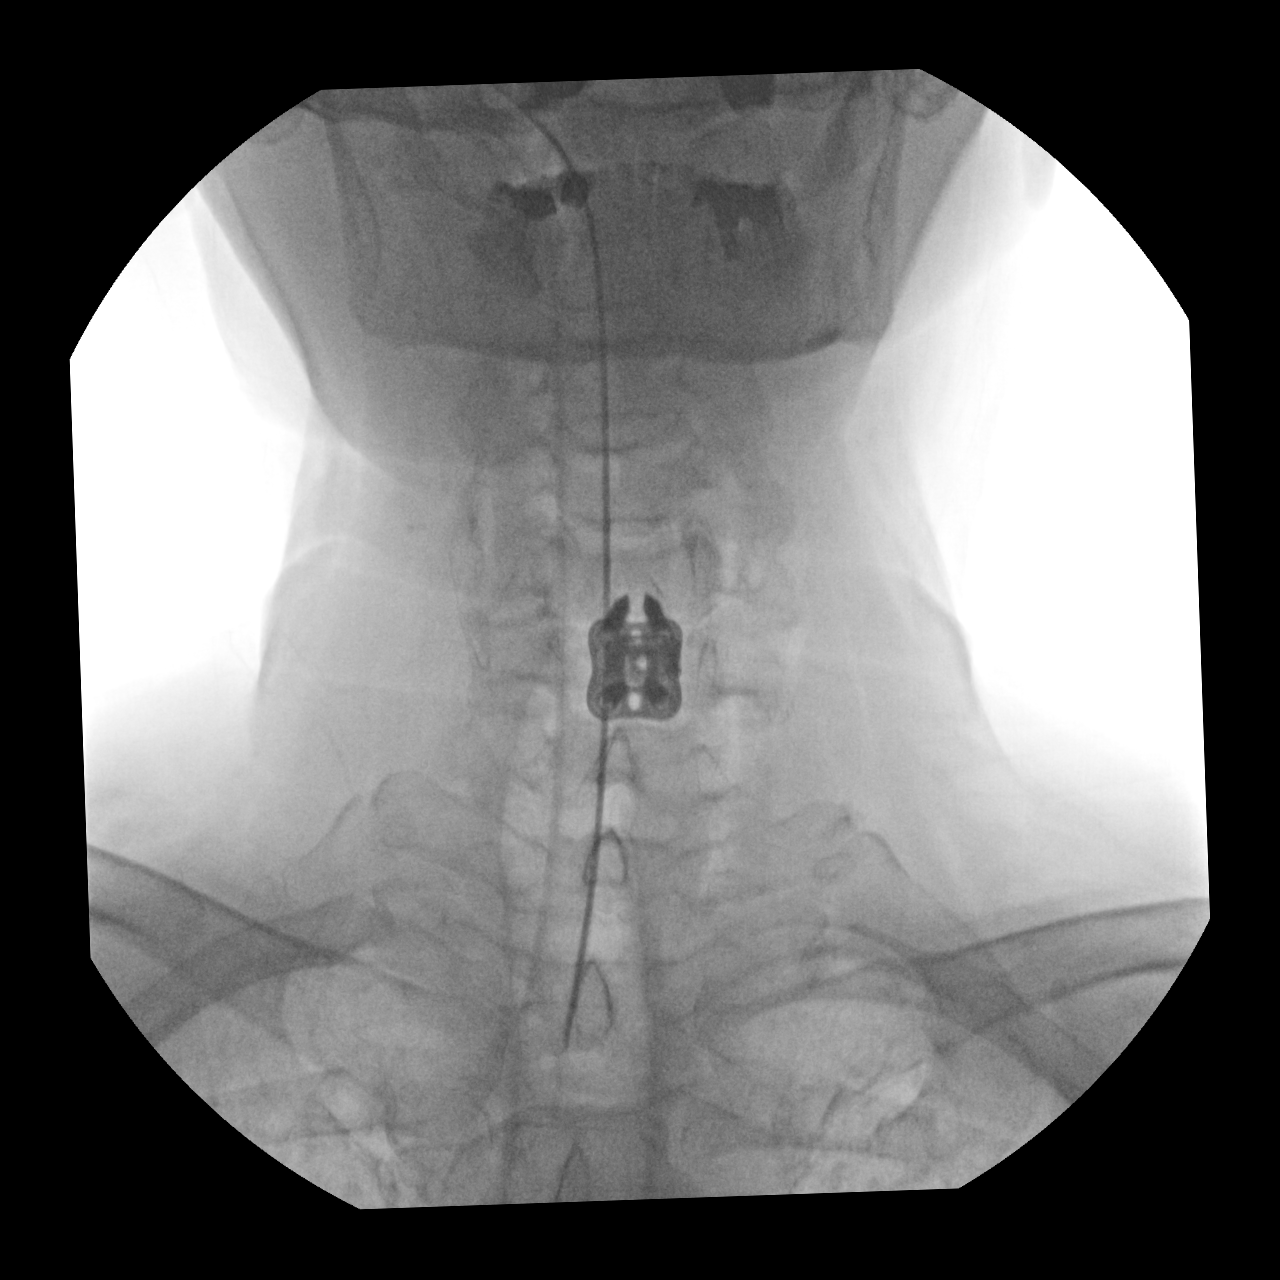

[3 of 3 positions shown; findings below may reference images not displayed]

FINDINGS: Multiple intraoperative fluoroscopic spot images are provided.
Interval anterior cervical disc fusion at C5-6 with plate and screw
fixation and interbody metallic spacer. Endotracheal tube with the
tip at the thoracic inlet.

FLUOROSCOPY TIME:  Fluoroscopy Time:  2.1 second

Radiation Exposure Index (if provided by the fluoroscopic device):
0.11 mGy

Number of Acquired Spot Images: 0
IMPRESSION: Interval ACDF at C5-6.

## 2020-09-13 IMAGING — RF DG C-ARM 1-60 MIN
1 series · 3 of 3 positions shown · non-contrast
Comparison: None.

CLINICAL DATA: C5-6 ACDF

EXAM:
CERVICAL SPINE - 2-3 VIEW; DG C-ARM 1-60 MIN

[Series 1: dg x-ray · 0.20mm/px · 3 of 3 slices shown]
[im 1/3]
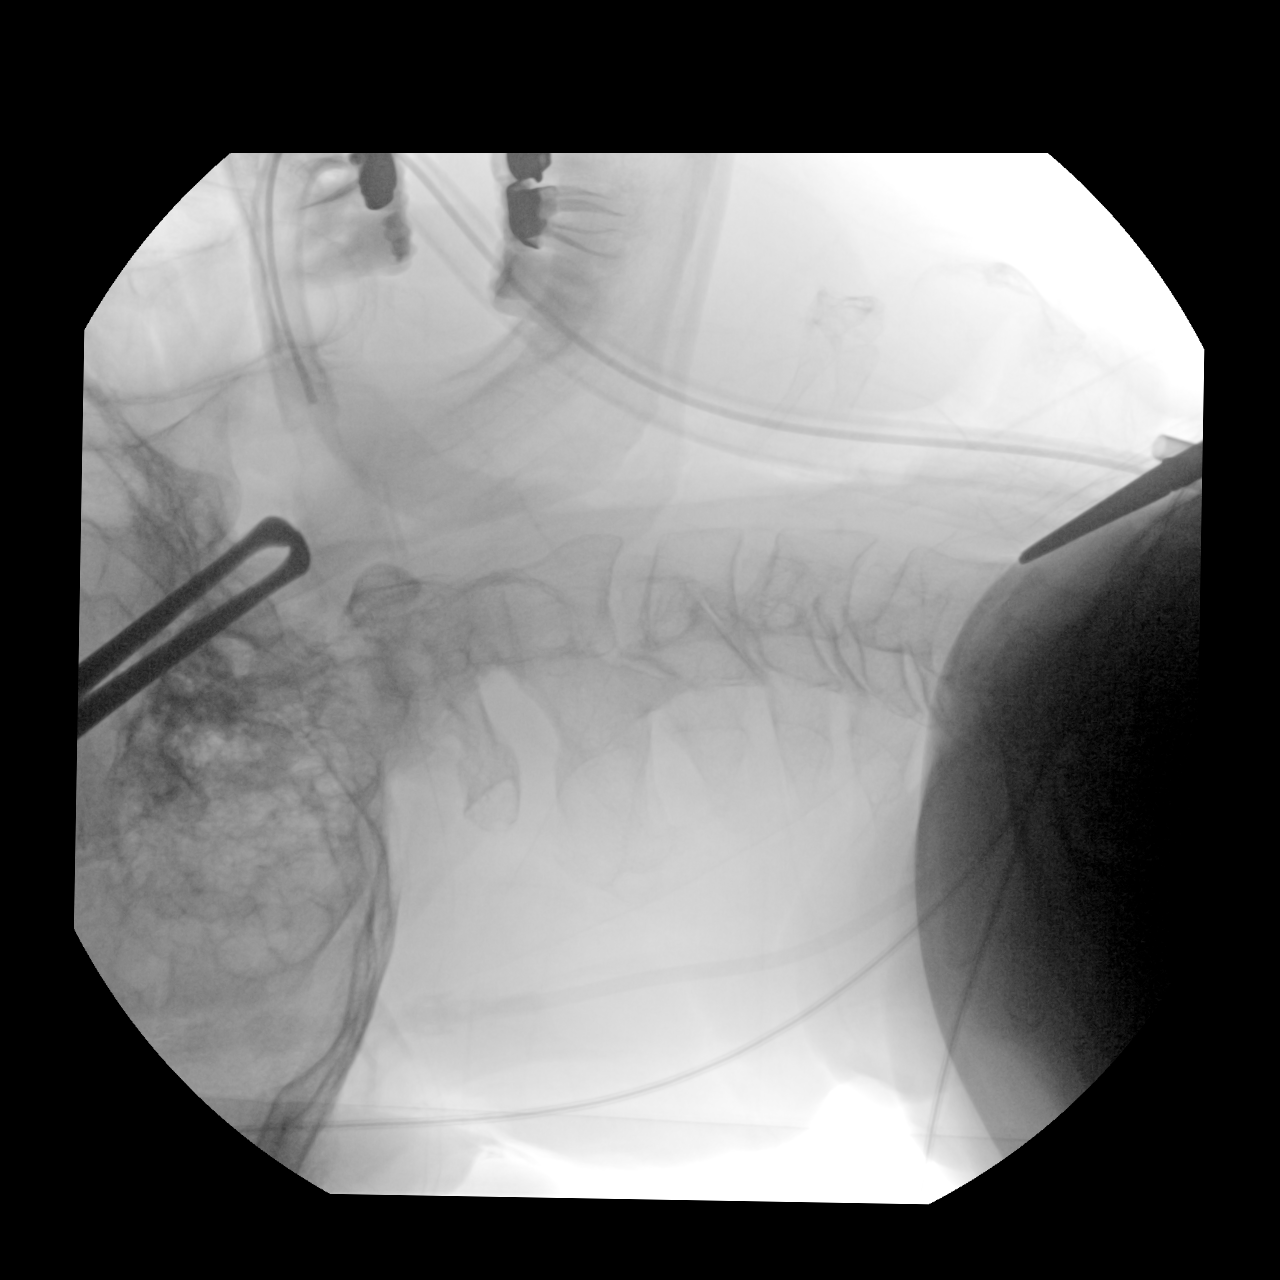
[im 2/3]
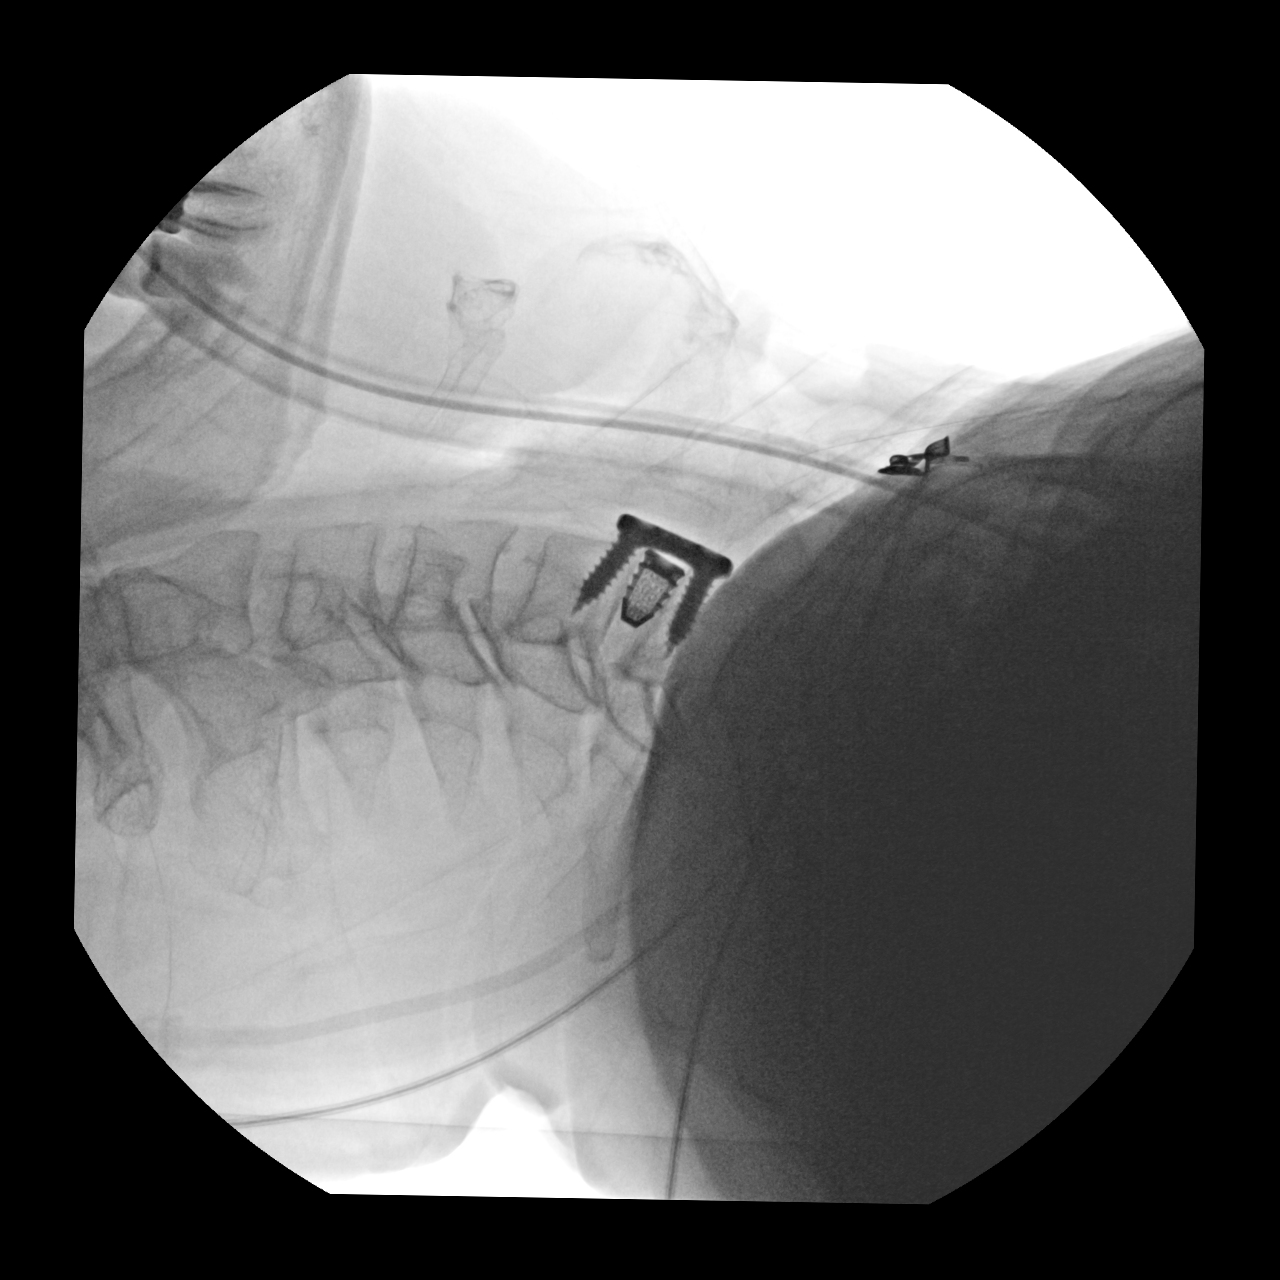
[im 3/3]
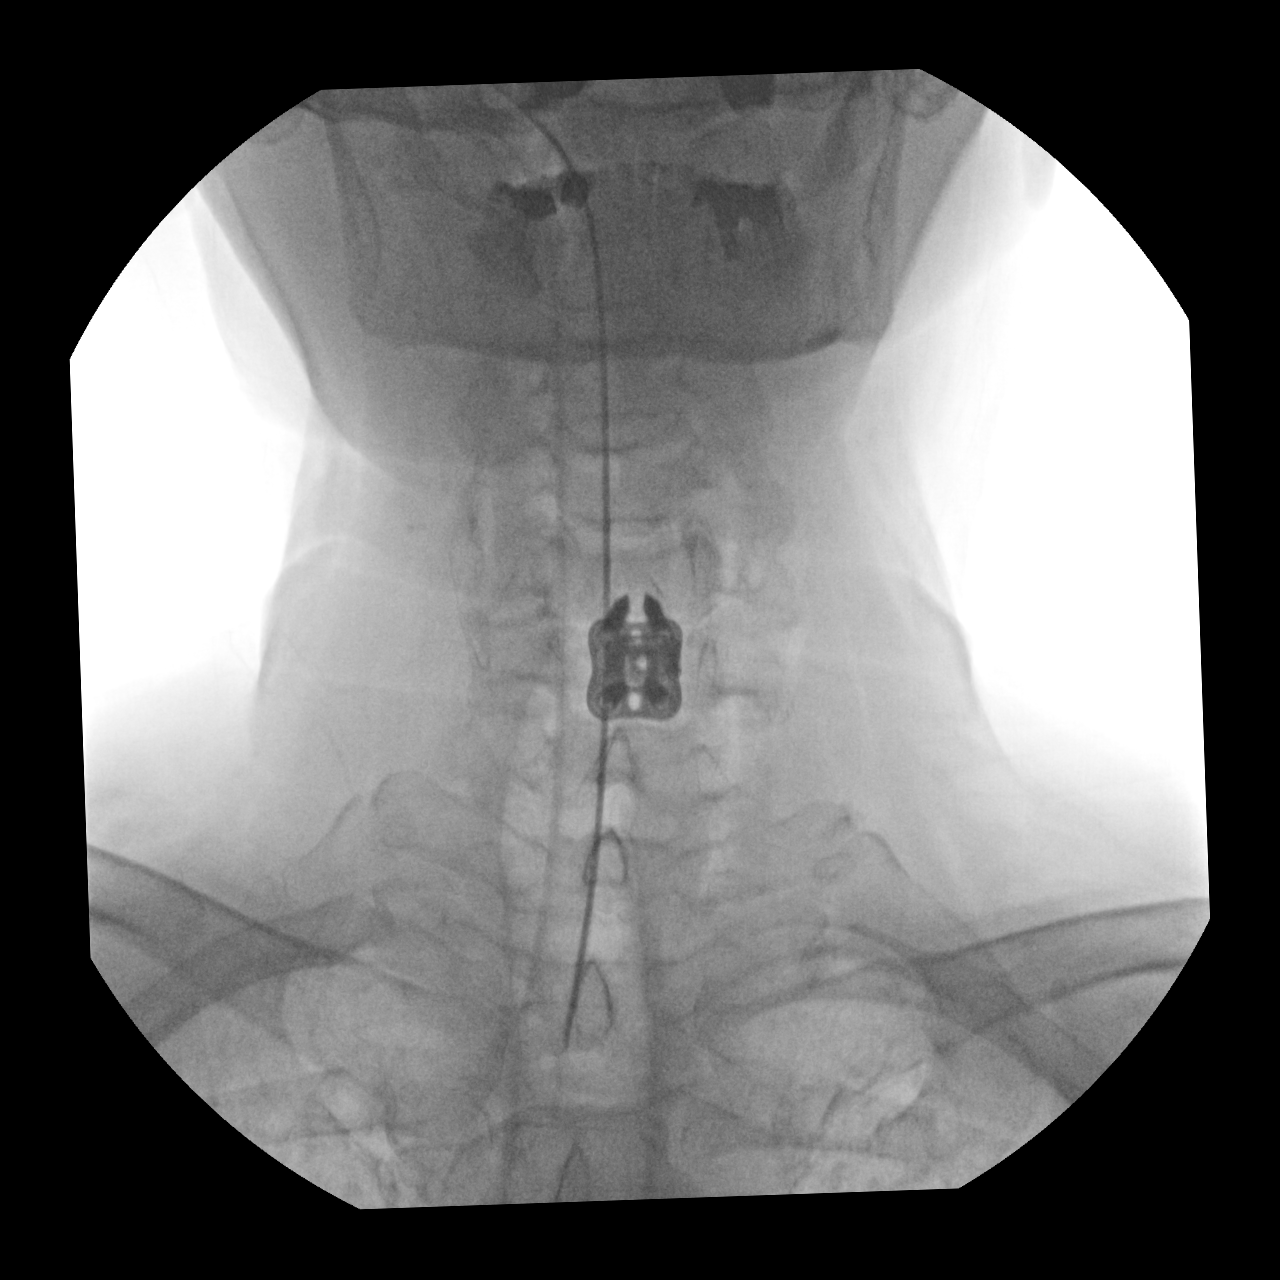

[3 of 3 positions shown; findings below may reference images not displayed]

FINDINGS: Multiple intraoperative fluoroscopic spot images are provided.
Interval anterior cervical disc fusion at C5-6 with plate and screw
fixation and interbody metallic spacer. Endotracheal tube with the
tip at the thoracic inlet.

FLUOROSCOPY TIME:  Fluoroscopy Time:  2.1 second

Radiation Exposure Index (if provided by the fluoroscopic device):
0.11 mGy

Number of Acquired Spot Images: 0
IMPRESSION: Interval ACDF at C5-6.

## 2020-09-13 SURGERY — ANTERIOR CERVICAL DECOMPRESSION/DISCECTOMY FUSION 1 LEVEL
Anesthesia: General

## 2020-09-13 MED ORDER — ONDANSETRON HCL 4 MG/2ML IJ SOLN
INTRAMUSCULAR | Status: AC
  Filled 2020-09-13: qty 2

## 2020-09-13 MED ORDER — OXYCODONE-ACETAMINOPHEN 7.5-325 MG PO TABS: 1.0000 | ORAL_TABLET | Freq: Four times a day (QID) | ORAL | 0 refills | Status: AC | PRN

## 2020-09-13 MED ORDER — SODIUM CHLORIDE 0.9 % IV SOLN: INTRAVENOUS | Status: DC

## 2020-09-13 MED ORDER — OXYCODONE-ACETAMINOPHEN 7.5-325 MG PO TABS
1.0000 | ORAL_TABLET | Freq: Once | ORAL | Status: AC
Start: 2020-09-13 — End: 2020-09-13
  Filled 2020-09-13: qty 1

## 2020-09-13 MED ORDER — PROPOFOL 10 MG/ML IV BOLUS
INTRAVENOUS | Status: AC
  Filled 2020-09-13: qty 20

## 2020-09-13 MED ORDER — EPHEDRINE 5 MG/ML INJ
INTRAVENOUS | Status: AC
  Filled 2020-09-13: qty 5

## 2020-09-13 MED ORDER — ROCURONIUM BROMIDE 100 MG/10ML IV SOLN
INTRAVENOUS | Status: DC | PRN
  Administered 2020-09-13: 5 mg via INTRAVENOUS

## 2020-09-13 MED ORDER — SODIUM CHLORIDE 0.9 % IV SOLN: INTRAVENOUS | Status: DC | PRN

## 2020-09-13 MED ORDER — SODIUM CHLORIDE (PF) 0.9 % IJ SOLN
INTRAMUSCULAR | Status: AC
  Filled 2020-09-13: qty 20

## 2020-09-13 MED ORDER — LIDOCAINE HCL 4 % MT SOLN
OROMUCOSAL | Status: DC | PRN
  Administered 2020-09-13: 4 mL via TOPICAL

## 2020-09-13 MED ORDER — SUCCINYLCHOLINE CHLORIDE 200 MG/10ML IV SOSY
PREFILLED_SYRINGE | INTRAVENOUS | Status: AC
  Filled 2020-09-13: qty 10

## 2020-09-13 MED ORDER — EPHEDRINE SULFATE 50 MG/ML IJ SOLN
INTRAMUSCULAR | Status: DC | PRN
  Administered 2020-09-13: 15 mg via INTRAVENOUS
  Administered 2020-09-13: 10 mg via INTRAVENOUS
  Administered 2020-09-13: 15 mg via INTRAVENOUS

## 2020-09-13 MED ORDER — FENTANYL CITRATE (PF) 100 MCG/2ML IJ SOLN: 50.0000 ug | Freq: Once | INTRAMUSCULAR | Status: AC

## 2020-09-13 MED ORDER — MIDAZOLAM HCL 2 MG/2ML IJ SOLN
INTRAMUSCULAR | Status: AC
  Filled 2020-09-13: qty 2

## 2020-09-13 MED ORDER — CEFAZOLIN SODIUM-DEXTROSE 2-4 GM/100ML-% IV SOLN
2.0000 g | INTRAVENOUS | Status: AC
  Administered 2020-09-13: 2 g via INTRAVENOUS

## 2020-09-13 MED ORDER — REMIFENTANIL HCL 1 MG IV SOLR
INTRAVENOUS | Status: AC
  Filled 2020-09-13: qty 1000

## 2020-09-13 MED ORDER — BUPIVACAINE-EPINEPHRINE (PF) 0.5% -1:200000 IJ SOLN
INTRAMUSCULAR | Status: DC | PRN
  Administered 2020-09-13: 3 mL

## 2020-09-13 MED ORDER — METHOCARBAMOL 500 MG PO TABS
ORAL_TABLET | ORAL | Status: AC
  Filled 2020-09-13: qty 1

## 2020-09-13 MED ORDER — LIDOCAINE HCL (PF) 2 % IJ SOLN
INTRAMUSCULAR | Status: AC
  Filled 2020-09-13: qty 5

## 2020-09-13 MED ORDER — ONDANSETRON HCL 4 MG/2ML IJ SOLN
INTRAMUSCULAR | Status: DC | PRN
  Administered 2020-09-13: 4 mg via INTRAVENOUS

## 2020-09-13 MED ORDER — OXYCODONE HCL 5 MG PO TABS: 5.0000 mg | ORAL_TABLET | Freq: Once | ORAL | Status: AC

## 2020-09-13 MED ORDER — FENTANYL CITRATE (PF) 100 MCG/2ML IJ SOLN
INTRAMUSCULAR | Status: AC
  Administered 2020-09-13: 50 ug via INTRAVENOUS
  Filled 2020-09-13: qty 2

## 2020-09-13 MED ORDER — OXYCODONE HCL 5 MG PO TABS
ORAL_TABLET | ORAL | Status: AC
  Administered 2020-09-13: 5 mg via ORAL
  Filled 2020-09-13: qty 1

## 2020-09-13 MED ORDER — PHENYLEPHRINE HCL (PRESSORS) 10 MG/ML IV SOLN
INTRAVENOUS | Status: AC
  Filled 2020-09-13: qty 1

## 2020-09-13 MED ORDER — PHENYLEPHRINE HCL (PRESSORS) 10 MG/ML IV SOLN
INTRAVENOUS | Status: DC | PRN
  Administered 2020-09-13 (×7): 100 ug via INTRAVENOUS

## 2020-09-13 MED ORDER — THROMBIN 5000 UNITS EX SOLR
CUTANEOUS | Status: DC | PRN
  Administered 2020-09-13: 5000 [IU] via TOPICAL

## 2020-09-13 MED ORDER — ROCURONIUM BROMIDE 10 MG/ML (PF) SYRINGE
PREFILLED_SYRINGE | INTRAVENOUS | Status: AC
  Filled 2020-09-13: qty 10

## 2020-09-13 MED ORDER — DEXAMETHASONE SODIUM PHOSPHATE 10 MG/ML IJ SOLN
INTRAMUSCULAR | Status: AC
  Filled 2020-09-13: qty 1

## 2020-09-13 MED ORDER — MIDAZOLAM HCL 2 MG/2ML IJ SOLN
INTRAMUSCULAR | Status: DC | PRN
  Administered 2020-09-13: 2 mg via INTRAVENOUS

## 2020-09-13 MED ORDER — HEMOSTATIC AGENTS (NO CHARGE) OPTIME
TOPICAL | Status: DC | PRN
  Administered 2020-09-13: 1 via TOPICAL

## 2020-09-13 MED ORDER — FENTANYL CITRATE (PF) 100 MCG/2ML IJ SOLN
INTRAMUSCULAR | Status: DC | PRN
  Administered 2020-09-13 (×2): 50 ug via INTRAVENOUS

## 2020-09-13 MED ORDER — PROPOFOL 10 MG/ML IV BOLUS
INTRAVENOUS | Status: DC | PRN
  Administered 2020-09-13: 150 mg via INTRAVENOUS

## 2020-09-13 MED ORDER — CEFAZOLIN SODIUM-DEXTROSE 2-4 GM/100ML-% IV SOLN
INTRAVENOUS | Status: AC
  Filled 2020-09-13: qty 100

## 2020-09-13 MED ORDER — SUCCINYLCHOLINE CHLORIDE 200 MG/10ML IV SOSY
PREFILLED_SYRINGE | INTRAVENOUS | Status: DC | PRN
  Administered 2020-09-13: 100 mg via INTRAVENOUS

## 2020-09-13 MED ORDER — DEXAMETHASONE SODIUM PHOSPHATE 10 MG/ML IJ SOLN
INTRAMUSCULAR | Status: DC | PRN
  Administered 2020-09-13: 10 mg via INTRAVENOUS

## 2020-09-13 MED ORDER — FENTANYL CITRATE (PF) 100 MCG/2ML IJ SOLN
25.0000 ug | INTRAMUSCULAR | Status: AC | PRN
  Administered 2020-09-13 (×5): 25 ug via INTRAVENOUS

## 2020-09-13 MED ORDER — PROMETHAZINE HCL 25 MG/ML IJ SOLN: 6.2500 mg | INTRAMUSCULAR | Status: DC | PRN

## 2020-09-13 MED ORDER — PROPOFOL 1000 MG/100ML IV EMUL
INTRAVENOUS | Status: AC
  Filled 2020-09-13: qty 100

## 2020-09-13 MED ORDER — FENTANYL CITRATE (PF) 100 MCG/2ML IJ SOLN
INTRAMUSCULAR | Status: AC
  Filled 2020-09-13: qty 2

## 2020-09-13 MED ORDER — ORAL CARE MOUTH RINSE: 15.0000 mL | Freq: Once | OROMUCOSAL | Status: AC

## 2020-09-13 MED ORDER — LIDOCAINE HCL (CARDIAC) PF 100 MG/5ML IV SOSY
PREFILLED_SYRINGE | INTRAVENOUS | Status: DC | PRN
  Administered 2020-09-13: 50 mg via INTRAVENOUS

## 2020-09-13 MED ORDER — CHLORHEXIDINE GLUCONATE 0.12 % MT SOLN
OROMUCOSAL | Status: AC
  Filled 2020-09-13: qty 15

## 2020-09-13 MED ORDER — METHOCARBAMOL 500 MG PO TABS: 500.0000 mg | ORAL_TABLET | Freq: Four times a day (QID) | ORAL | 1 refills | Status: DC

## 2020-09-13 MED ORDER — FENTANYL CITRATE (PF) 100 MCG/2ML IJ SOLN
INTRAMUSCULAR | Status: AC
  Administered 2020-09-13: 25 ug via INTRAVENOUS
  Filled 2020-09-13: qty 2

## 2020-09-13 MED ORDER — SENNA 8.6 MG PO TABS: 1.0000 | ORAL_TABLET | Freq: Every day | ORAL | 0 refills | Status: DC | PRN

## 2020-09-13 MED ORDER — CHLORHEXIDINE GLUCONATE 0.12 % MT SOLN
15.0000 mL | Freq: Once | OROMUCOSAL | Status: AC
  Administered 2020-09-13: 15 mL via OROMUCOSAL

## 2020-09-13 MED ORDER — 0.9 % SODIUM CHLORIDE (POUR BTL) OPTIME
TOPICAL | Status: DC | PRN
  Administered 2020-09-13: 1000 mL

## 2020-09-13 MED ORDER — REMIFENTANIL HCL 1 MG IV SOLR
INTRAVENOUS | Status: DC | PRN
  Administered 2020-09-13: .08 ug/kg/min via INTRAVENOUS

## 2020-09-13 MED ORDER — GLYCOPYRROLATE 0.2 MG/ML IJ SOLN
INTRAMUSCULAR | Status: DC | PRN
  Administered 2020-09-13: .2 mg via INTRAVENOUS

## 2020-09-13 MED ORDER — GLYCOPYRROLATE 0.2 MG/ML IJ SOLN
INTRAMUSCULAR | Status: AC
  Filled 2020-09-13: qty 1

## 2020-09-13 MED ORDER — OXYCODONE-ACETAMINOPHEN 7.5-325 MG PO TABS
ORAL_TABLET | ORAL | Status: AC
  Administered 2020-09-13: 1 via ORAL
  Filled 2020-09-13: qty 1

## 2020-09-13 MED ORDER — METHOCARBAMOL 500 MG PO TABS
500.0000 mg | ORAL_TABLET | Freq: Once | ORAL | Status: AC
  Administered 2020-09-13: 500 mg via ORAL

## 2020-09-13 MED ORDER — PROPOFOL 500 MG/50ML IV EMUL
INTRAVENOUS | Status: DC | PRN
  Administered 2020-09-13: 100 ug/kg/min via INTRAVENOUS

## 2020-09-13 SURGICAL SUPPLY — 67 items
ADH SKN CLS APL DERMABOND .7 (GAUZE/BANDAGES/DRESSINGS) ×1
AGENT HMST MTR 8 SURGIFLO (HEMOSTASIS) ×1
APL PRP STRL LF DISP 70% ISPRP (MISCELLANEOUS) ×2
BASKET BONE COLLECTION (BASKET) IMPLANT
BULB RESERV EVAC DRAIN JP 100C (MISCELLANEOUS) IMPLANT
BUR NEURO DRILL SOFT 3.0X3.8M (BURR) ×2 IMPLANT
CHLORAPREP W/TINT 26 (MISCELLANEOUS) ×4 IMPLANT
COUNTER NEEDLE 20/40 LG (NEEDLE) ×2 IMPLANT
CUP MEDICINE 2OZ PLAST GRAD ST (MISCELLANEOUS) ×2 IMPLANT
DERMABOND ADVANCED (GAUZE/BANDAGES/DRESSINGS) ×1
DERMABOND ADVANCED .7 DNX12 (GAUZE/BANDAGES/DRESSINGS) ×1 IMPLANT
DRAIN CHANNEL JP 10F RND 20C F (MISCELLANEOUS) IMPLANT
DRAPE C ARM PK CFD 31 SPINE (DRAPES) ×2 IMPLANT
DRAPE LAPAROTOMY 77X122 PED (DRAPES) ×2 IMPLANT
DRAPE MICROSCOPE SPINE 48X150 (DRAPES) ×2 IMPLANT
DRAPE SURG 17X11 SM STRL (DRAPES) ×8 IMPLANT
ELECT CAUTERY BLADE TIP 2.5 (TIP) ×2
ELECT REM PT RETURN 9FT ADLT (ELECTROSURGICAL) ×2
ELECTRODE CAUTERY BLDE TIP 2.5 (TIP) ×1 IMPLANT
ELECTRODE REM PT RTRN 9FT ADLT (ELECTROSURGICAL) ×1 IMPLANT
FEE INTRAOP CADWELL SUPPLY NCS (MISCELLANEOUS) ×1 IMPLANT
FEE INTRAOP MONITOR IMPULS NCS (MISCELLANEOUS) IMPLANT
GAUZE 4X4 16PLY ~~LOC~~+RFID DBL (SPONGE) ×2 IMPLANT
GLOVE SURG SYN 6.5 ES PF (GLOVE) ×2 IMPLANT
GLOVE SURG SYN 6.5 PF PI (GLOVE) ×1 IMPLANT
GLOVE SURG SYN 8.5  E (GLOVE) ×3
GLOVE SURG SYN 8.5 E (GLOVE) ×3 IMPLANT
GLOVE SURG SYN 8.5 PF PI (GLOVE) ×3 IMPLANT
GLOVE SURG UNDER POLY LF SZ6.5 (GLOVE) ×2 IMPLANT
GOWN SRG LRG LVL 4 IMPRV REINF (GOWNS) ×1 IMPLANT
GOWN SRG XL LVL 3 NONREINFORCE (GOWNS) ×1 IMPLANT
GOWN STRL NON-REIN TWL XL LVL3 (GOWNS) ×2
GOWN STRL REIN LRG LVL4 (GOWNS) ×2
GRADUATE 1200CC STRL 31836 (MISCELLANEOUS) ×2 IMPLANT
GRAFT DURAGEN MATRIX 1WX1L (Tissue) IMPLANT
INTRAOP CADWELL SUPPLY FEE NCS (MISCELLANEOUS) ×1
INTRAOP DISP SUPPLY FEE NCS (MISCELLANEOUS) ×2
INTRAOP MONITOR FEE IMPULS NCS (MISCELLANEOUS)
INTRAOP MONITOR FEE IMPULSE (MISCELLANEOUS)
KIT TURNOVER KIT A (KITS) ×2 IMPLANT
MANIFOLD NEPTUNE II (INSTRUMENTS) ×2 IMPLANT
MARKER SKIN DUAL TIP RULER LAB (MISCELLANEOUS) ×4 IMPLANT
NDL SAFETY ECLIPSE 18X1.5 (NEEDLE) ×1 IMPLANT
NEEDLE HYPO 18GX1.5 SHARP (NEEDLE) ×2
NEEDLE HYPO 22GX1.5 SAFETY (NEEDLE) ×2 IMPLANT
NS IRRIG 1000ML POUR BTL (IV SOLUTION) ×2 IMPLANT
PACK LAMINECTOMY NEURO (CUSTOM PROCEDURE TRAY) ×2 IMPLANT
PAD ARMBOARD 7.5X6 YLW CONV (MISCELLANEOUS) ×4 IMPLANT
PIN CASPAR 14 (PIN) ×1 IMPLANT
PIN CASPAR 14MM (PIN) ×2 IMPLANT
PLATE ANT CERV XTEND 1 LV 10 (Plate) ×1 IMPLANT
PUTTY DBX 1CC (Putty) ×2 IMPLANT
PUTTY DBX 1CC DEPUY (Putty) IMPLANT
SCREW VAR 4.2 XD SELF DRILL 16 (Screw) ×4 IMPLANT
SPACER C HEDRON 12X14 7M 7D (Spacer) ×1 IMPLANT
SPOGE SURGIFLO 8M (HEMOSTASIS) ×1
SPONGE KITTNER 5P (MISCELLANEOUS) ×2 IMPLANT
SPONGE SURGIFLO 8M (HEMOSTASIS) ×1 IMPLANT
STAPLER SKIN PROX 35W (STAPLE) IMPLANT
SUT V-LOC 90 ABS DVC 3-0 CL (SUTURE) ×2 IMPLANT
SUT VIC AB 3-0 SH 8-18 (SUTURE) ×2 IMPLANT
SYR 30ML LL (SYRINGE) ×2 IMPLANT
TAPE CLOTH 3X10 WHT NS LF (GAUZE/BANDAGES/DRESSINGS) ×2 IMPLANT
TOWEL OR 17X26 4PK STRL BLUE (TOWEL DISPOSABLE) ×6 IMPLANT
TRAY FOLEY MTR SLVR 16FR STAT (SET/KITS/TRAYS/PACK) IMPLANT
TUBING CONNECTING 10 (TUBING) ×2 IMPLANT
WATER STERILE IRR 500ML POUR (IV SOLUTION) ×2 IMPLANT

## 2020-09-13 NOTE — Anesthesia Preprocedure Evaluation (Signed)
Anesthesia Evaluation  Patient identified by MRN, date of birth, ID band Patient awake  General Assessment Comment:45m midline shift from subdural hematoma.  Apparently had a blown pupil, poor mental status, and family declined intervention. Mannitol was given as a last resort, and his mental status improved and he wished for surgical intervention. Dr YCari Carawayrelayed to me that he spoke to the patient and family who will perioperatively rescind his DNR.  Reviewed: Allergy & Precautions, NPO status , Patient's Chart, lab work & pertinent test results  History of Anesthesia Complications Negative for: history of anesthetic complications  Airway Mallampati: II  TM Distance: >3 FB Neck ROM: Full    Dental  (+) Teeth Intact, Dental Advidsory Given   Pulmonary neg pulmonary ROS, neg shortness of breath, neg sleep apnea, neg COPD, neg recent URI, Patient abstained from smoking.Not current smoker,    Pulmonary exam normal breath sounds clear to auscultation       Cardiovascular Exercise Tolerance: Good METS(-) hypertension(-) angina(-) CAD and (-) Past MI negative cardio ROS  (-) dysrhythmias  Rhythm:Regular Rate:Normal - Systolic murmurs    Neuro/Psych  Headaches, neg Seizures PSYCHIATRIC DISORDERS Depression    GI/Hepatic GERD  Medicated and Controlled,(+)     (-) substance abuse  ,   Endo/Other  diabetes, Well Controlled  Renal/GU negative Renal ROS     Musculoskeletal   Abdominal   Peds  Hematology   Anesthesia Other Findings Past Medical History: No date: Depression No date: Frequency of urination     Comment:  since radiation tx No date: GERD (gastroesophageal reflux disease) No date: Headache No date: History of traumatic head injury     Comment:  age 65-  head injury w/ fractured skull in coma for 3               days made full recovery withou further treatment--  no               residual No date:  Nocturia urologist-  dr ottelin/  oncologist- dr mTammi Klippel Prostate cancer ([facility]     Comment:  T1c, Gleason 4+4,  PSA 14.24,  vol 25.6cc/  IM radiation              therapy (05-23-2015 to 06-27-2015)   05-23-2015  to  06-27-2015: S/P radiation therapy     Comment:  prostatic fossa  68.4 Gy in 38 fractions in 1.8 Gy No date: Type 2 diabetes, diet controlled ([place] No date: Ulcer No date: Wears glasses  Reproductive/Obstetrics                             Anesthesia Physical  Anesthesia Plan  ASA: 2 and emergent  Anesthesia Plan: General   Post-op Pain Management:    Induction: Intravenous  PONV Risk Score and Plan: 2 and Ondansetron, Dexamethasone, Treatment may vary due to age or medical condition and Midazolam  Airway Management Planned: Oral ETT and Video Laryngoscope Planned  Additional Equipment:   Intra-op Plan:   Post-operative Plan: Possible Post-op intubation/ventilation  Informed Consent: I have reviewed the patients History and Physical, chart, labs and discussed the procedure including the risks, benefits and alternatives for the proposed anesthesia with the patient or authorized representative who has indicated his/her understanding and acceptance.   Patient has DNR.  Discussed DNR with patient and Suspend DNR.   Dental advisory given  Plan Discussed with: CRNA and Surgeon  Anesthesia Plan Comments: (Discussed  risks of anesthesia with patient, including PONV, sore throat, lip/dental damage. Rare risks discussed as well, such as cardiorespiratory and neurological sequelae, including death. Advised patient and family that there is a strong possibility of post-operative prolonged intubation. Patient understands.)        Anesthesia Quick Evaluation

## 2020-09-13 NOTE — Transfer of Care (Signed)
Immediate Anesthesia Transfer of Care Note  Patient: Charles Marks  Procedure(s) Performed: C5-6 ANTERIOR CERVICAL DECOMPRESSION/DISCECTOMY FUSION 1 LEVEL  Patient Location: PACU  Anesthesia Type:General  Level of Consciousness: sedated  Airway & Oxygen Therapy: Patient Spontanous Breathing and Patient connected to face mask oxygen  Post-op Assessment: Report given to RN and Post -op Vital signs reviewed and stable  Post vital signs: Reviewed  Last Vitals:  Vitals Value Taken Time  BP 128/56 09/13/20 0916  Temp    Pulse 90 09/13/20 0917  Resp 10 09/13/20 0917  SpO2 98 % 09/13/20 0917  Vitals shown include unvalidated device data.  Last Pain:  Vitals:   09/13/20 0621  TempSrc: Oral         Complications: No notable events documented.

## 2020-09-13 NOTE — Progress Notes (Signed)
Pt ambulated around unit without any complications, voided [id number] cc urine, pain is decreasing. Dr. Izora Ribas notified.

## 2020-09-13 NOTE — Discharge Instructions (Addendum)
Your surgeon has performed an operation on your cervical spine (neck) to relieve pressure on the spinal cord and/or nerves. This involved making an incision in the front of your neck and removing one or more of the discs that support your spine. Next, a small piece of bone, a titanium plate, and screws were used to fuse two or more of the vertebrae (bones) together.  The following are instructions to help in your recovery once you have been discharged from the hospital. Even if you feel well, it is important that you follow these activity guidelines. If you do not let your neck heal properly from the surgery, you can increase the chance of return of your symptoms and other complications.  * Do not take anti-inflammatory medications for 3 months after surgery (naproxen [Aleve], ibuprofen [Advil, Motrin], etc.). These medications can prevent your bones from healing properly.  You may resume your Aspirin in 14 days.  Activity    No bending, lifting, or twisting ("BLT"). Avoid lifting objects heavier than 10 pounds (gallon milk jug).  Where possible, avoid household activities that involve lifting, bending, reaching, pushing, or pulling such as laundry, vacuuming, grocery shopping, and childcare. Try to arrange for help from friends and family for these activities while your back heals.  Increase physical activity slowly as tolerated.  Taking short walks is encouraged, but avoid strenuous exercise. Do not jog, run, bicycle, lift weights, or participate in any other exercises unless specifically allowed by your doctor.  Talk to your doctor before resuming sexual activity.  You should not drive until cleared by your doctor.  Until released by your doctor, you should not return to work or school.  You should rest at home and let your body heal.   You may shower three days after your surgery.  After showering, lightly dab your incision dry. Do not take a tub bath or go swimming until approved by your  doctor at your follow-up appointment.  If you smoke, we strongly recommend that you quit.  Smoking has been proven to interfere with normal bone healing and will dramatically reduce the success rate of your surgery. Please contact [facility] (800-QUIT-NOW) and use the resources at www.[facility].com for assistance in stopping smoking.  Surgical Incision   Keep your incision area clean and dry.  Your incision was closed with Dermabond glue. The glue should begin to peel away within about a week.  Diet           You may return to your usual diet. However, you may experience discomfort when swallowing in the first month after your surgery. This is normal. You may find that softer foods are more comfortable for you to swallow. Be sure to stay hydrated.  When to Contact us  You may experience pain in your neck and/or pain between your shoulder blades. This is normal and should improve in the next few weeks with the help of pain medication, muscle relaxers, and rest. Some patients report that a warm compress on the back of the neck or between the shoulder blades helps.  However, should you experience any of the following, contact us immediately: New numbness or weakness Pain that is progressively getting worse, and is not relieved by your pain medication, muscle relaxers, rest, and warm compresses Bleeding, redness, swelling, pain, or drainage from surgical incision Chills or flu-like symptoms Fever greater than 101.0 F (38.3 C) Inability to eat, drink fluids, or take medications Problems with bowel or bladder functions Difficulty breathing or shortness of  breath Warmth, tenderness, or swelling in your calf Contact Information During office hours (Monday-Friday 9 am to 5 pm), please call your physician at [phone] and ask for Berdine Addison After hours and weekends, please call [phone] and speak with the answering service, who will contact the doctor on call.  If that fails, call the  [place] Operator at [phone] and ask for the Neurosurgery Resident On Call  For a life-threatening emergency, call [place]   The drugs that you were given will stay in your system until tomorrow so for the next 24 hours you should not:  Drive an automobile Make any legal decisions Drink any alcoholic beverage   You may resume regular meals tomorrow.  Today it is better to start with liquids and gradually work up to solid foods.  You may eat anything you prefer, but it is better to start with liquids, then soup and crackers, and gradually work up to solid foods.   Please notify your doctor immediately if you have any unusual bleeding, trouble breathing, redness and pain at the surgery site, drainage, fever, or pain not relieved by medication.    Additional Instructions:        Please contact your physician with any problems or Same Day Surgery at [phone], Monday through Friday 6 am to 4 pm, or [place] at [facility] number at [phone].

## 2020-09-13 NOTE — Discharge Summary (Signed)
Physician Discharge Summary  Patient ID: Charles Marks MRN: [id number] DOB/AGE: [birth year] 66 y.o.  Admit date: 09/13/2020 Discharge date: 09/13/2020  Admission Diagnoses: cervical radiculopathy  Discharge Diagnoses:  Active Problems:   * No active hospital problems. *   Discharged Condition: good  Hospital Course: Charles Marks is a 65 y.o presenting for right arm pain and found to have right C5-6 radiculopathy. His operative course was uncomplicated and he was discharged home with pain medication on POD#0.   Consults: None  Significant Diagnostic Studies: none  Treatments: pain medications  Discharge Exam: Blood pressure 132/84, pulse 78, temperature 98.4 F (36.9 C), temperature source Oral, height '6\' 3"'$  (1.905 m), weight 83.6 kg, SpO2 97 %. CN II-XII grossly intact RUE 4+/5 throughout except 4- tricep. LUE 5/5 throughout  Disposition: Discharge disposition: 01-Home or Self Care        Allergies as of 09/13/2020   No Known Allergies      Medication List     STOP taking these medications    aspirin EC 81 MG tablet       TAKE these medications    AIMOVIG (140 MG DOSE) Crane Inject 1 Dose into the skin every 30 (thirty) days.   atorvastatin 20 MG tablet Commonly known as: LIPITOR Take 20 mg by mouth daily.   CALCIUM-VITAMIN D PO Take 1 tablet by mouth daily.   gabapentin 300 MG capsule Commonly known as: NEURONTIN Take 300 mg by mouth 2 (two) times daily. Morning and lunch   gabapentin 400 MG capsule Commonly known as: NEURONTIN Take 800 mg by mouth every evening.   Icy Hot 5 % Ptch Generic drug: Menthol Place 1 application onto the skin daily as needed (pain).   levETIRAcetam 1000 MG tablet Commonly known as: KEPPRA Take 1,000 mg by mouth in the morning and at bedtime.   Lidocaine HCl 4 % Crea Apply 1 application topically daily as needed (pain).   methocarbamol 500 MG tablet Commonly known as: Robaxin Take 1 tablet (500 mg total) by  mouth 4 (four) times daily.   Multi-Vitamins Tabs Take 1 tablet by mouth daily.   omeprazole 20 MG capsule Commonly known as: PRILOSEC Take 20 mg by mouth daily.   oxyCODONE-acetaminophen 7.5-325 MG tablet Commonly known as: Percocet Take 1 tablet by mouth every 6 (six) hours as needed for up to 5 days for severe pain.   QUEtiapine 100 MG tablet Commonly known as: SEROQUEL Take 100 mg by mouth at bedtime.   QUEtiapine 200 MG 24 hr tablet Commonly known as: SEROQUEL XR Take 200 mg by mouth at bedtime.   senna 8.6 MG Tabs tablet Commonly known as: SENOKOT Take 1 tablet (8.6 mg total) by mouth daily as needed for mild constipation.   sertraline 100 MG tablet Commonly known as: ZOLOFT Take 100 mg by mouth in the morning and at bedtime.   SUMAtriptan 100 MG tablet Commonly known as: IMITREX Take 100 mg by mouth every 2 (two) hours as needed for migraine.   verapamil 120 MG tablet Commonly known as: CALAN Take 120 mg by mouth daily.   Vitamin D3 25 MCG (1000 UT) Caps Take 1 capsule by mouth daily.        Follow-up Information     Loleta Dicker, PA Follow up in 2 week(s).   Why: Please follow up with me in about 2 weeks for incision check. This appointment should already be scheduled with our office. Contact information: [place] [address]  [id number]                 Signed: Loleta Dicker 09/13/2020, 9:21 AM

## 2020-09-13 NOTE — Op Note (Signed)
Indications: Charles Marks is suffering from cervical radiculopathy and right arm weakness. he failed conservative management, and elected to proceed with surgery.  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   Preoperative Note:   Risks of surgery discussed include: infection, bleeding, stroke, coma, death, paralysis, CSF leak, nerve/spinal cord injury, numbness, tingling, weakness, complex regional pain syndrome, recurrent stenosis and/or disc herniation, vascular injury, development of instability, neck/back pain, need for further surgery, persistent symptoms, development of deformity, and the risks of anesthesia. The patient understood these risks and agreed to proceed.  Operative Note:   Procedure:  1) Anterior cervical diskectomy and fusion at C5-6 2) Anterior cervical instrumentation at C5-6 using Globus Xtend 3) Insertion of biomechanical device at C5-6   Procedure: After obtaining informed consent, the patient taken to the operating room, placed in supine position, general anesthesia induced.  The patient had a small shoulder roll placed behind their shoulders.  The patient received preop antibiotics and IV Decadron.  The patient had a neck incision outlined, was prepped and draped in usual sterile fashion. The incision was injected with local anesthetic.   An incision was opened, dissection taken down medial to the carotid artery and jugular vein, lateral to the trachea and esophagus.  The prevertebral fascia was identified, and a localizing x-ray demonstrated the correct level.  The longus colli were dissected laterally, and self-retaining retractors placed to open the operative field. The microscope was then brought into the field.  With this  complete, distractor pins were placed in the vertebral bodies of C5 and C6. The distractor was placed, and the annulus at C5/6 was opened using a bovie.  Curettes and pituitary rongeurs used to remove the majority of disk, then the drill was used to remove the posterior osteophyte and begin the foraminotomies. The nerve hook was used to elevate the posterior longitudinal ligament, which was then removed with Kerrison rongeurs. Bilateral foraminotomies were performed. The microblunt nerve hook could be passed out the foramen bilaterally.   Meticulous hemostasis was obtained.  A biomechanical device (Globus Hedron C 7 mm height x 14 mm width by 12 mm depth) was placed at C5/6. The device had been filled with allograft for aid in arthrodesis.  The caspar distractor was removed, and bone wax used for hemostasis. A 10 mm Globus Xtend plate was chosen.  Two screws placed in each vertebral body, respectively making sure the screws were behind the locking mechanism.  Final AP and lateral radiographs were taken.   With everything in good position, the wound was irrigated copiously with bacitracin-containing solution and meticulous hemostasis obtained.  Wound was closed in 2 layers using interrupted inverted 3-0 Vicryl sutures.  The wound was dressed with dermabond, the head of bed at 30 degrees, taken to recovery room in stable condition.  No new postop neurological deficits were identified.  Sponge and pattie counts were correct at the end of the procedure.   I performed the entire procedure with the assistance of Cooper Render PA as an Pensions consultant.  Meade Maw MD

## 2020-09-13 NOTE — Anesthesia Procedure Notes (Signed)
Procedure Name: Intubation Date/Time: 09/13/2020 7:53 AM Performed by: Rolla Plate, CRNA Pre-anesthesia Checklist: Patient identified, Patient being monitored, Timeout performed, Emergency Drugs available and Suction available Patient Re-evaluated:Patient Re-evaluated prior to induction Oxygen Delivery Method: Circle system utilized Preoxygenation: Pre-oxygenation with 100% oxygen Induction Type: IV induction Ventilation: Mask ventilation without difficulty Laryngoscope Size: Mac and 4 Grade View: Grade I Tube type: Oral Tube size: 7.5 mm Number of attempts: 1 Airway Equipment and Method: Stylet, Video-laryngoscopy and LTA kit utilized Placement Confirmation: ETT inserted through vocal cords under direct vision, positive ETCO2 and breath sounds checked- equal and bilateral Secured at: 23 cm Tube secured with: Tape Dental Injury: Teeth and Oropharynx as per pre-operative assessment

## 2020-09-13 NOTE — H&P (Signed)
I have reviewed and confirmed my history and physical from 08/31/2020 with no additions or changes. Plan for C5-6 ACDF.  Risks and benefits reviewed.  Heart sounds normal no MRG. Chest Clear to Auscultation Bilaterally.

## 2020-09-13 NOTE — Progress Notes (Signed)
PHARMACY -  BRIEF ANTIBIOTIC NOTE   Pharmacy has received consult(s) for Cefazolin from an OR provider.  The patient's profile has been reviewed for ht/wt/allergies/indication/available labs.    One time order(s) placed for Cefazolin 2 gm preop  Further antibiotics/pharmacy consults should be ordered by admitting physician if indicated.                       Thank you, Renda Rolls, PharmD, [facility] 09/13/2020 6:21 AM

## 2020-09-14 NOTE — Anesthesia Postprocedure Evaluation (Signed)
Anesthesia Post Note  Patient: Trey Sant  Procedure(s) Performed: C5-6 ANTERIOR CERVICAL DECOMPRESSION/DISCECTOMY FUSION 1 LEVEL  Patient location during evaluation: PACU Anesthesia Type: General Level of consciousness: awake and alert Pain management: pain level controlled Vital Signs Assessment: post-procedure vital signs reviewed and stable Respiratory status: spontaneous breathing, nonlabored ventilation, respiratory function stable and patient connected to nasal cannula oxygen Cardiovascular status: blood pressure returned to baseline and stable Postop Assessment: no apparent nausea or vomiting Anesthetic complications: no   No notable events documented.   Last Vitals:  Vitals:   09/13/20 1134 09/13/20 1231  BP: 135/81 136/85  Pulse: 94 95  Resp: 18 18  Temp:    SpO2: 96% 98%    Last Pain:  Vitals:   09/13/20 1231  TempSrc:   PainSc: 5                  Martha Clan

## 2020-09-15 NOTE — Progress Notes (Signed)
pt c/o pain at upper sternum level only present when he swallows. no pain at any other time to this area or with palpation.  informed pt can try chloreseptic spray as this may be from breathing tube. Instructed him that if starts having chest pain or pain that is present when not swallowing, radiation of this pain etc he needs to get in touch with his doctor or go to ER, he verbalized understanding. Pt does have gerd, has been taking his PPI

## 2020-09-29 ENCOUNTER — Other Ambulatory Visit: Payer: Self-pay | Admitting: Neurosurgery

## 2020-09-29 DIAGNOSIS — Z981 Arthrodesis status: Secondary | ICD-10-CM

## 2020-10-02 DIAGNOSIS — M79601 Pain in right arm: Secondary | ICD-10-CM | POA: Insufficient documentation

## 2020-10-05 ENCOUNTER — Other Ambulatory Visit: Payer: Self-pay

## 2020-10-05 ENCOUNTER — Ambulatory Visit
Admission: RE | Admit: 2020-10-05 | Discharge: 2020-10-05 | Disposition: A | Discharge status: Home / Self Care | Payer: Federal, State, Local not specified - PPO | Source: Ambulatory Visit | Attending: Neurosurgery | Admitting: Neurosurgery

## 2020-10-05 DIAGNOSIS — Z981 Arthrodesis status: Secondary | ICD-10-CM | POA: Insufficient documentation

## 2020-10-05 IMAGING — MR MR CERVICAL SPINE W/O CM
5 series · 41 of 48 positions shown · non-contrast
Comparison: None.

CLINICAL DATA: Right arm pain

EXAM:
MRI CERVICAL SPINE WITHOUT CONTRAST
TECHNIQUE: Multiplanar, multisequence MR imaging of the cervical spine was
performed. No intravenous contrast was administered.

[Series 2: T2 · sagittal · 3.0mm · 0.69mm/px · 7 of 13 slices shown (1 of 2)]
[im 1/13]
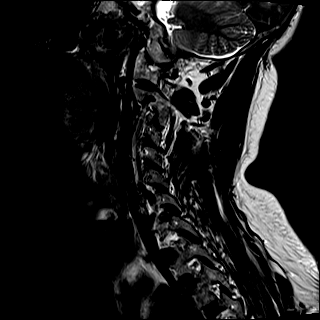
[im 3/13]
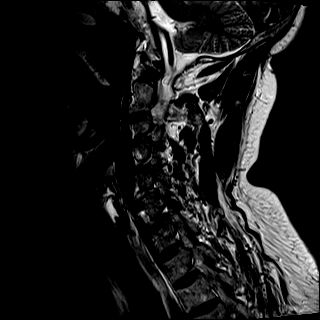
[im 5/13]
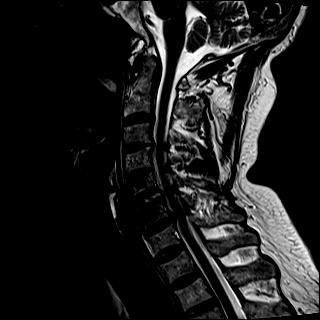
[im 7/13]
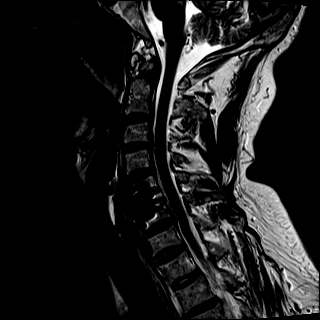
[im 9/13]
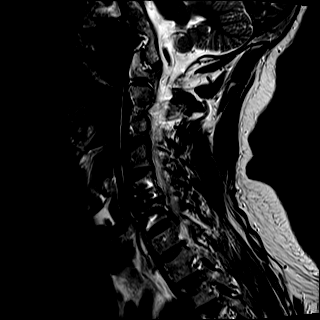
[im 11/13]
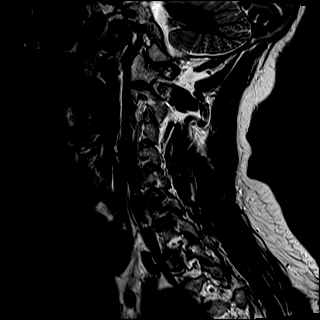
[im 13/13]
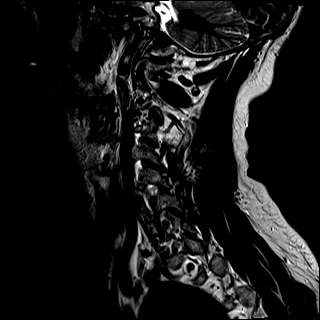

[Series 3: T1 · sagittal · 3.0mm · 0.86mm/px · 6 of 13 slices shown]
[im 1/13]
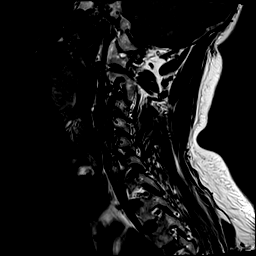
[im 3/13]
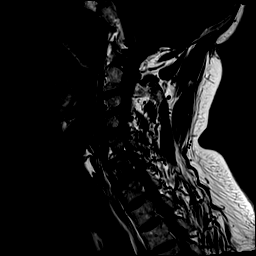
[im 5/13]
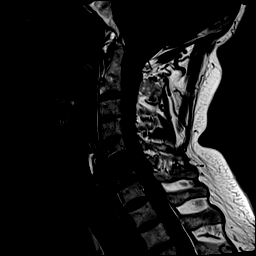
[im 8/13]
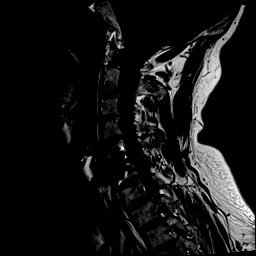
[im 10/13]
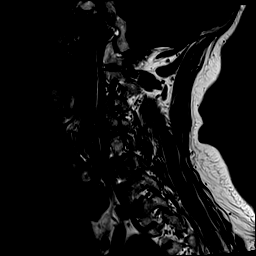
[im 13/13]
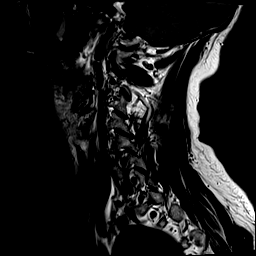

[Series 4: STIR · sagittal · 3.0mm · 0.69mm/px · 6 of 13 slices shown]
[im 1/13]
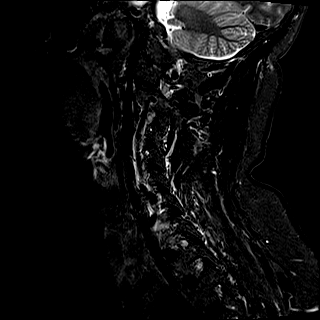
[im 3/13]
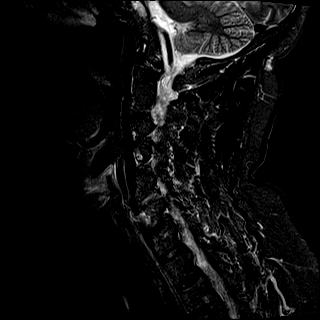
[im 5/13]
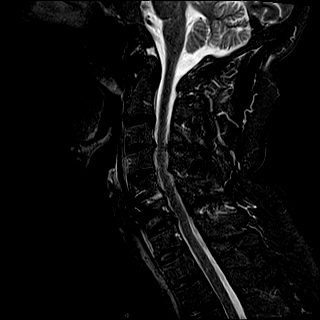
[im 8/13]
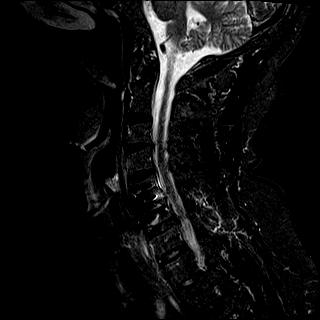
[im 10/13]
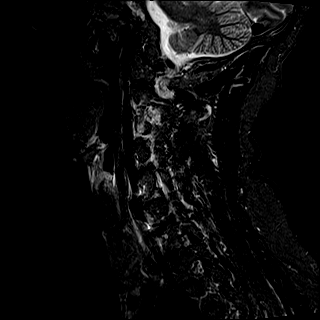
[im 13/13]
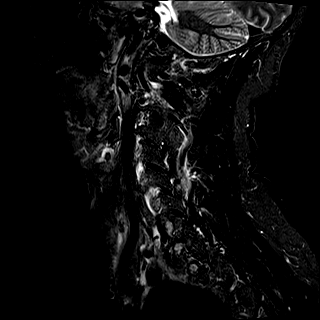

[Series 5: T2 · axial · 3.0mm · 0.62mm/px · z∈[-93,+19]mm · 14 of 29 slices shown (2 of 2)]
[im 1/29]
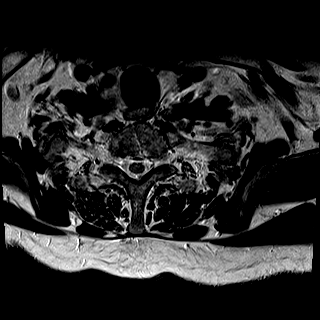
[im 3/29]
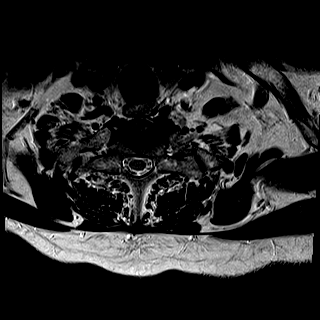
[im 5/29]
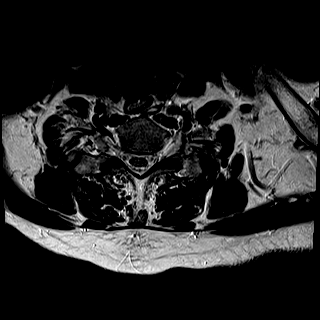
[im 7/29]
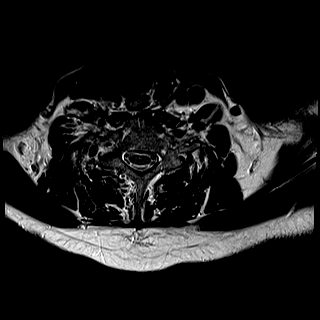
[im 9/29]
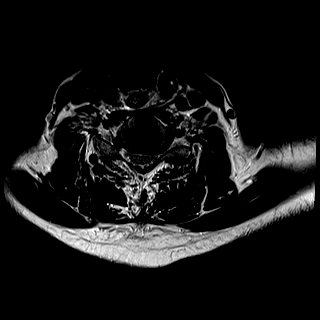
[im 11/29]
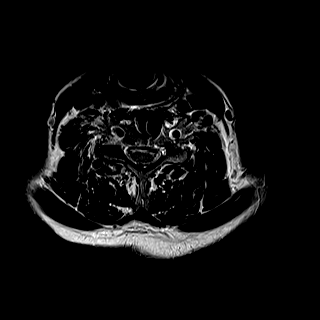
[im 13/29]
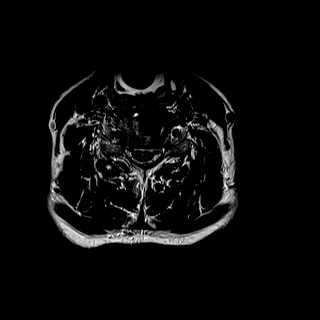
[im 16/29]
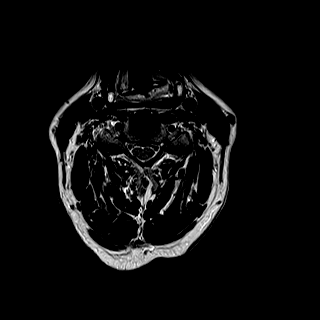
[im 18/29]
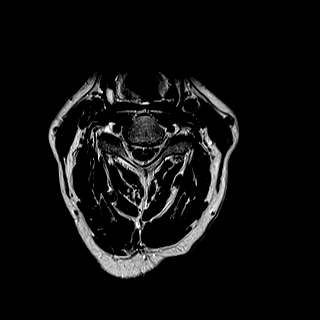
[im 20/29]
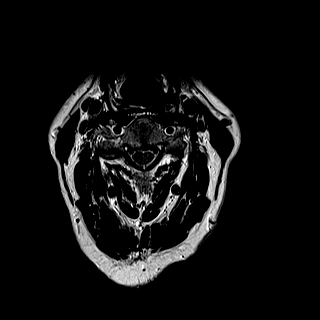
[im 22/29]
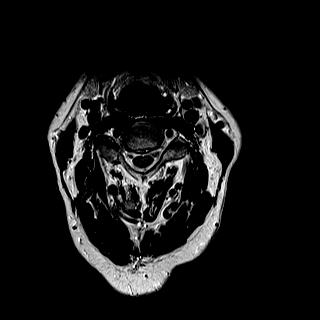
[im 24/29]
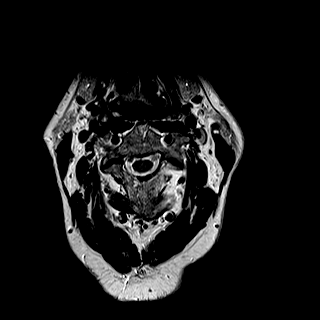
[im 26/29]
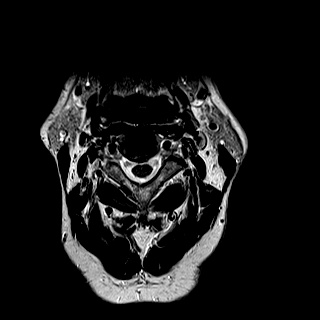
[im 29/29]
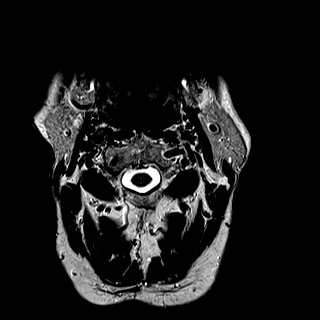

[Series 6: mpgr ax · axial · 3.0mm · 0.35mm/px · z∈[-84,+29]mm · 8 of 31 slices shown]
[im 1/31]
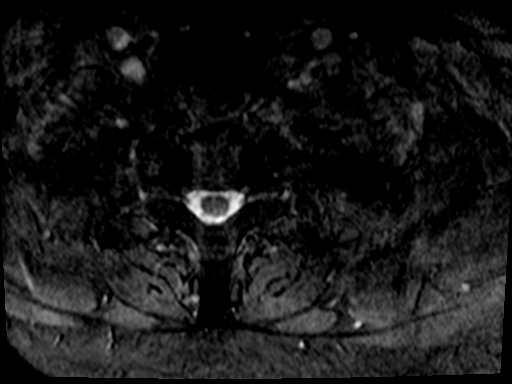
[im 5/31]
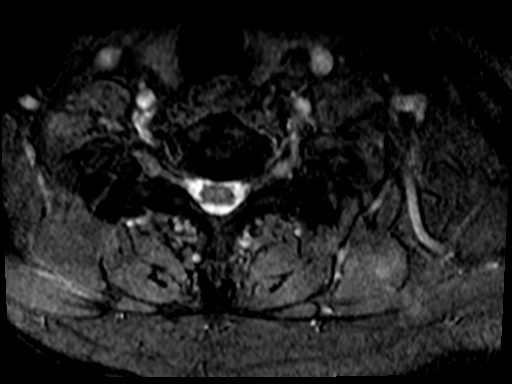
[im 9/31]
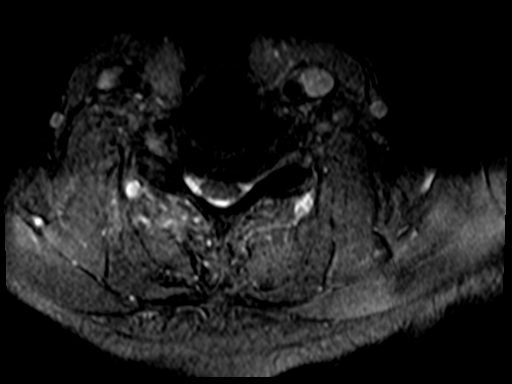
[im 13/31]
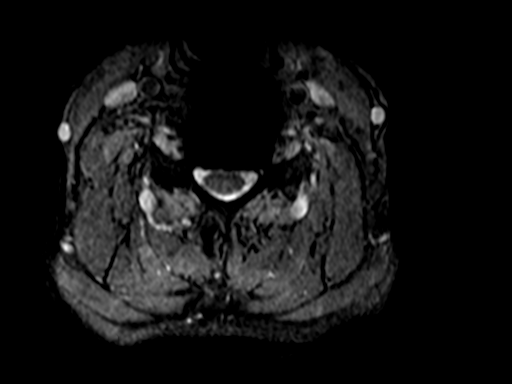
[im 18/31]
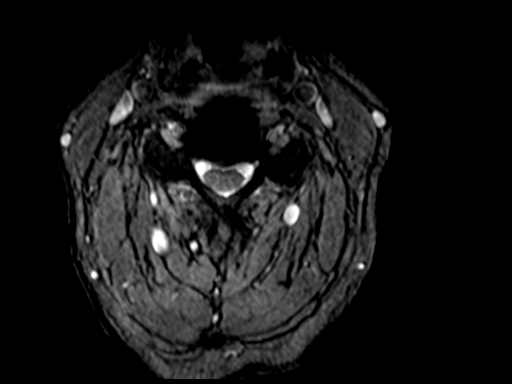
[im 22/31]
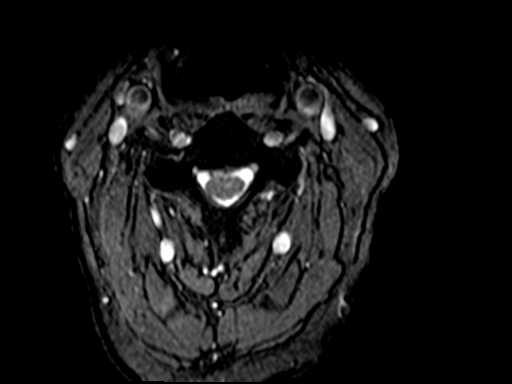
[im 26/31]
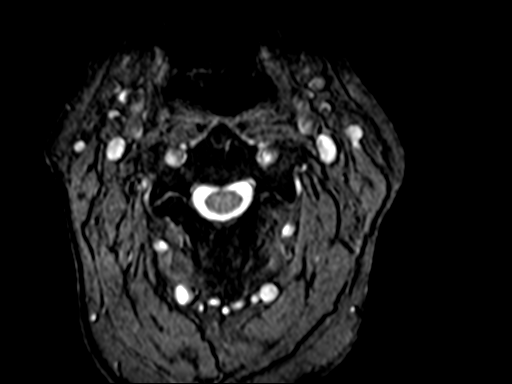
[im 31/31]
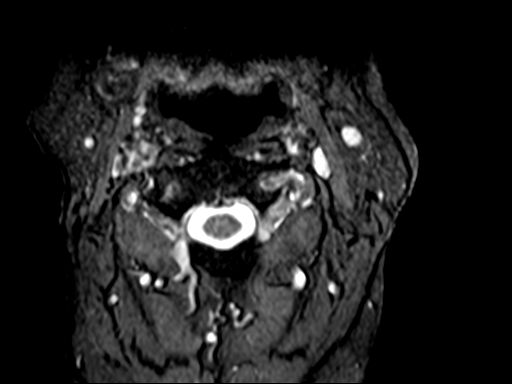

[41 of 48 positions shown; findings below may reference images not displayed]

FINDINGS: Alignment: Physiologic.

Vertebrae: C5-6 ACDF. No acute osseous lesion. Normal bone marrow
signal.

Cord: Normal signal and morphology.

Posterior Fossa, vertebral arteries, paraspinal tissues: Negative.

Disc levels:

C1-2: Unremarkable.

C2-3: Small central disc protrusion. There is no spinal canal
stenosis. No neural foraminal stenosis.

C3-4: Small central disc protrusion with right-greater-than-left
uncovertebral hypertrophy, unchanged . No spinal canal stenosis.
Unchanged moderate right neural foraminal stenosis.

C4-5: Left uncinate spurring, unchanged. There is no spinal canal
stenosis. Unchanged mild left neural foraminal stenosis.

C5-6: ACDF. There is no spinal canal stenosis. No neural foraminal
stenosis.

C6-7: Small left foraminal disc protrusion superimposed on mild disc
bulge. There is no spinal canal stenosis. Unchanged moderate left
neural foraminal stenosis.

C7-T1: Normal disc space and facet joints. There is no spinal canal
stenosis. No neural foraminal stenosis.
IMPRESSION: 1. Unchanged left foraminal disc protrusion at C6-7 with moderate
left neural foraminal stenosis.
2. Unchanged moderate right C3-4 neural foraminal stenosis.
3. C5-6 ACDF with improved patency of the spinal canal.

## 2020-11-03 DIAGNOSIS — R200 Anesthesia of skin: Secondary | ICD-10-CM | POA: Insufficient documentation

## 2020-11-21 ENCOUNTER — Emergency Department: Payer: Federal, State, Local not specified - PPO

## 2020-11-21 ENCOUNTER — Other Ambulatory Visit: Payer: Self-pay

## 2020-11-21 ENCOUNTER — Emergency Department
Admission: EM | Admit: 2020-11-21 | Discharge: 2020-11-21 | Disposition: A | Discharge status: Home / Self Care | Payer: Federal, State, Local not specified - PPO | Attending: Emergency Medicine | Admitting: Emergency Medicine

## 2020-11-21 ENCOUNTER — Encounter: Payer: Self-pay | Admitting: Emergency Medicine

## 2020-11-21 DIAGNOSIS — E119 Type 2 diabetes mellitus without complications: Secondary | ICD-10-CM | POA: Insufficient documentation

## 2020-11-21 DIAGNOSIS — Z20822 Contact with and (suspected) exposure to covid-19: Secondary | ICD-10-CM | POA: Diagnosis not present

## 2020-11-21 DIAGNOSIS — Z79899 Other long term (current) drug therapy: Secondary | ICD-10-CM | POA: Insufficient documentation

## 2020-11-21 DIAGNOSIS — K209 Esophagitis, unspecified without bleeding: Secondary | ICD-10-CM | POA: Insufficient documentation

## 2020-11-21 DIAGNOSIS — R1030 Lower abdominal pain, unspecified: Secondary | ICD-10-CM | POA: Diagnosis present

## 2020-11-21 DIAGNOSIS — Z8546 Personal history of malignant neoplasm of prostate: Secondary | ICD-10-CM | POA: Diagnosis not present

## 2020-11-21 LAB — COMPREHENSIVE METABOLIC PANEL
ALT: 31 U/L (ref 0–44)
AST: 33 U/L (ref 15–41)
Albumin: 5.1 g/dL — ABNORMAL HIGH (ref 3.5–5.0)
Alkaline Phosphatase: 66 U/L (ref 38–126)
Anion gap: 9 (ref 5–15)
BUN: 16 mg/dL (ref 8–23)
CO2: 28 mmol/L (ref 22–32)
Calcium: 10.4 mg/dL — ABNORMAL HIGH (ref 8.9–10.3)
Chloride: 104 mmol/L (ref 98–111)
Creatinine, Ser: 0.84 mg/dL (ref 0.61–1.24)
GFR, Estimated: >60 mL/min (ref >60)
Glucose, Bld: 141 mg/dL — ABNORMAL HIGH (ref 70–99)
Potassium: 3.9 mmol/L (ref 3.5–5.1)
Sodium: 141 mmol/L (ref 135–145)
Total Bilirubin: 1.3 mg/dL — ABNORMAL HIGH (ref 0.3–1.2)
Total Protein: 8 g/dL (ref 6.5–8.1)

## 2020-11-21 LAB — URINALYSIS, ROUTINE W REFLEX MICROSCOPIC
Bacteria, UA: NONE SEEN
Glucose, UA: NEGATIVE mg/dL
Hgb urine dipstick: NEGATIVE
Ketones, ur: 20 mg/dL — AB
Leukocytes,Ua: NEGATIVE
Nitrite: NEGATIVE
Protein, ur: 30 mg/dL — AB
Specific Gravity, Urine: 1.029 (ref 1.005–1.030)
Squamous Epithelial / HPF: NONE SEEN (ref 0–5)
pH: 6 (ref 5.0–8.0)

## 2020-11-21 LAB — CBC WITH DIFFERENTIAL/PLATELET
Abs Immature Granulocytes: 0.03 10*3/uL (ref 0.00–0.07)
Basophils Absolute: 0 10*3/uL (ref 0.0–0.1)
Basophils Relative: 0 %
Eosinophils Absolute: 0 10*3/uL (ref 0.0–0.5)
Eosinophils Relative: 0 %
HCT: 46.4 % (ref 39.0–52.0)
Hemoglobin: 16.1 g/dL (ref 13.0–17.0)
Immature Granulocytes: 0 %
Lymphocytes Relative: 4 %
Lymphs Abs: 0.4 10*3/uL — ABNORMAL LOW (ref 0.7–4.0)
MCH: 30 pg (ref 26.0–34.0)
MCHC: 34.7 g/dL (ref 30.0–36.0)
MCV: 86.4 fL (ref 80.0–100.0)
Monocytes Absolute: 0.5 10*3/uL (ref 0.1–1.0)
Monocytes Relative: 5 %
Neutro Abs: 9.3 10*3/uL — ABNORMAL HIGH (ref 1.7–7.7)
Neutrophils Relative %: 91 %
Platelets: 235 10*3/uL (ref 150–400)
RBC: 5.37 MIL/uL (ref 4.22–5.81)
RDW: 14.7 % (ref 11.5–15.5)
WBC: 10.3 10*3/uL (ref 4.0–10.5)
nRBC: 0 % (ref 0.0–0.2)

## 2020-11-21 LAB — TROPONIN I (HIGH SENSITIVITY)
Troponin I (High Sensitivity): 9 ng/L (ref <18)
Troponin I (High Sensitivity): 10 ng/L (ref <18)

## 2020-11-21 LAB — RESP PANEL BY RT-PCR (FLU A&B, COVID) ARPGX2
Influenza A by PCR: NEGATIVE
Influenza B by PCR: NEGATIVE
SARS Coronavirus 2 by RT PCR: NEGATIVE

## 2020-11-21 LAB — LIPASE, BLOOD: Lipase: 60 U/L — ABNORMAL HIGH (ref 11–51)

## 2020-11-21 IMAGING — DX DG CHEST 1V PORT
2 series · 2 of 2 positions shown · non-contrast
Comparison: Radiograph [DATE]

CLINICAL DATA: Chest pain

EXAM:
PORTABLE CHEST 1 VIEW

[chest ap (1 of 2)]
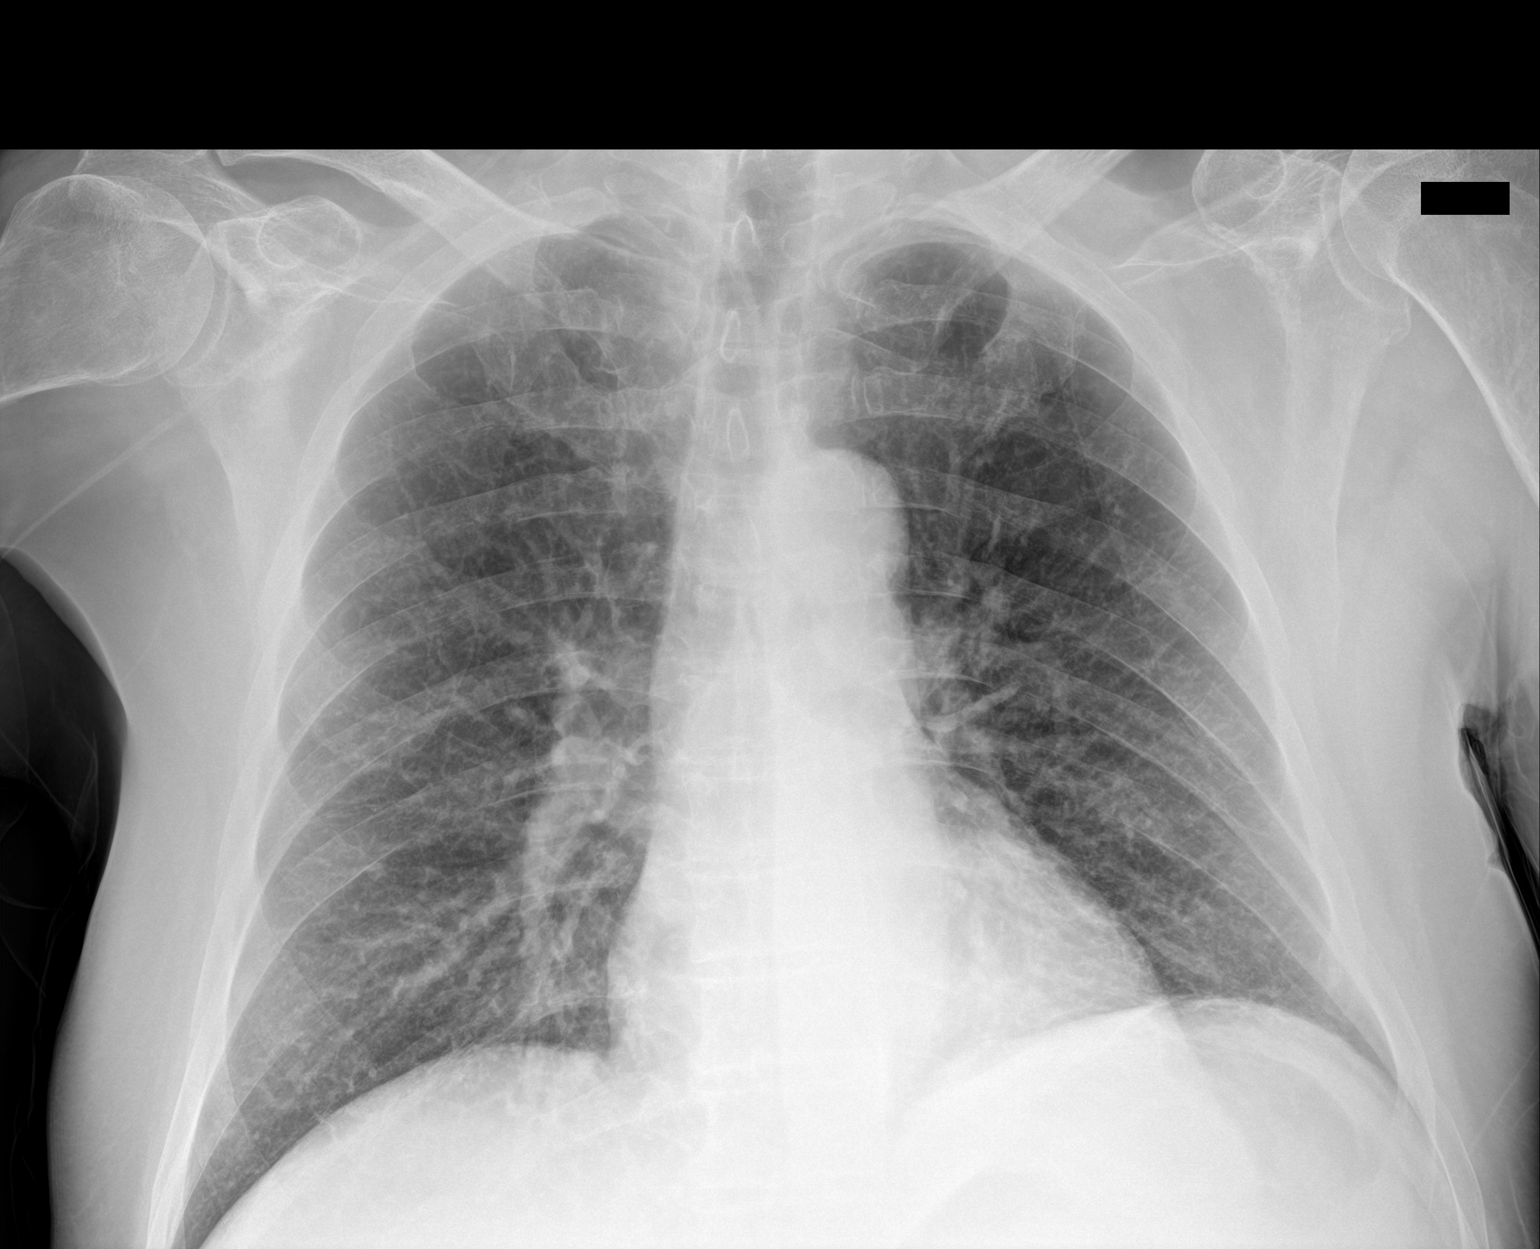

[chest ap (2 of 2)]
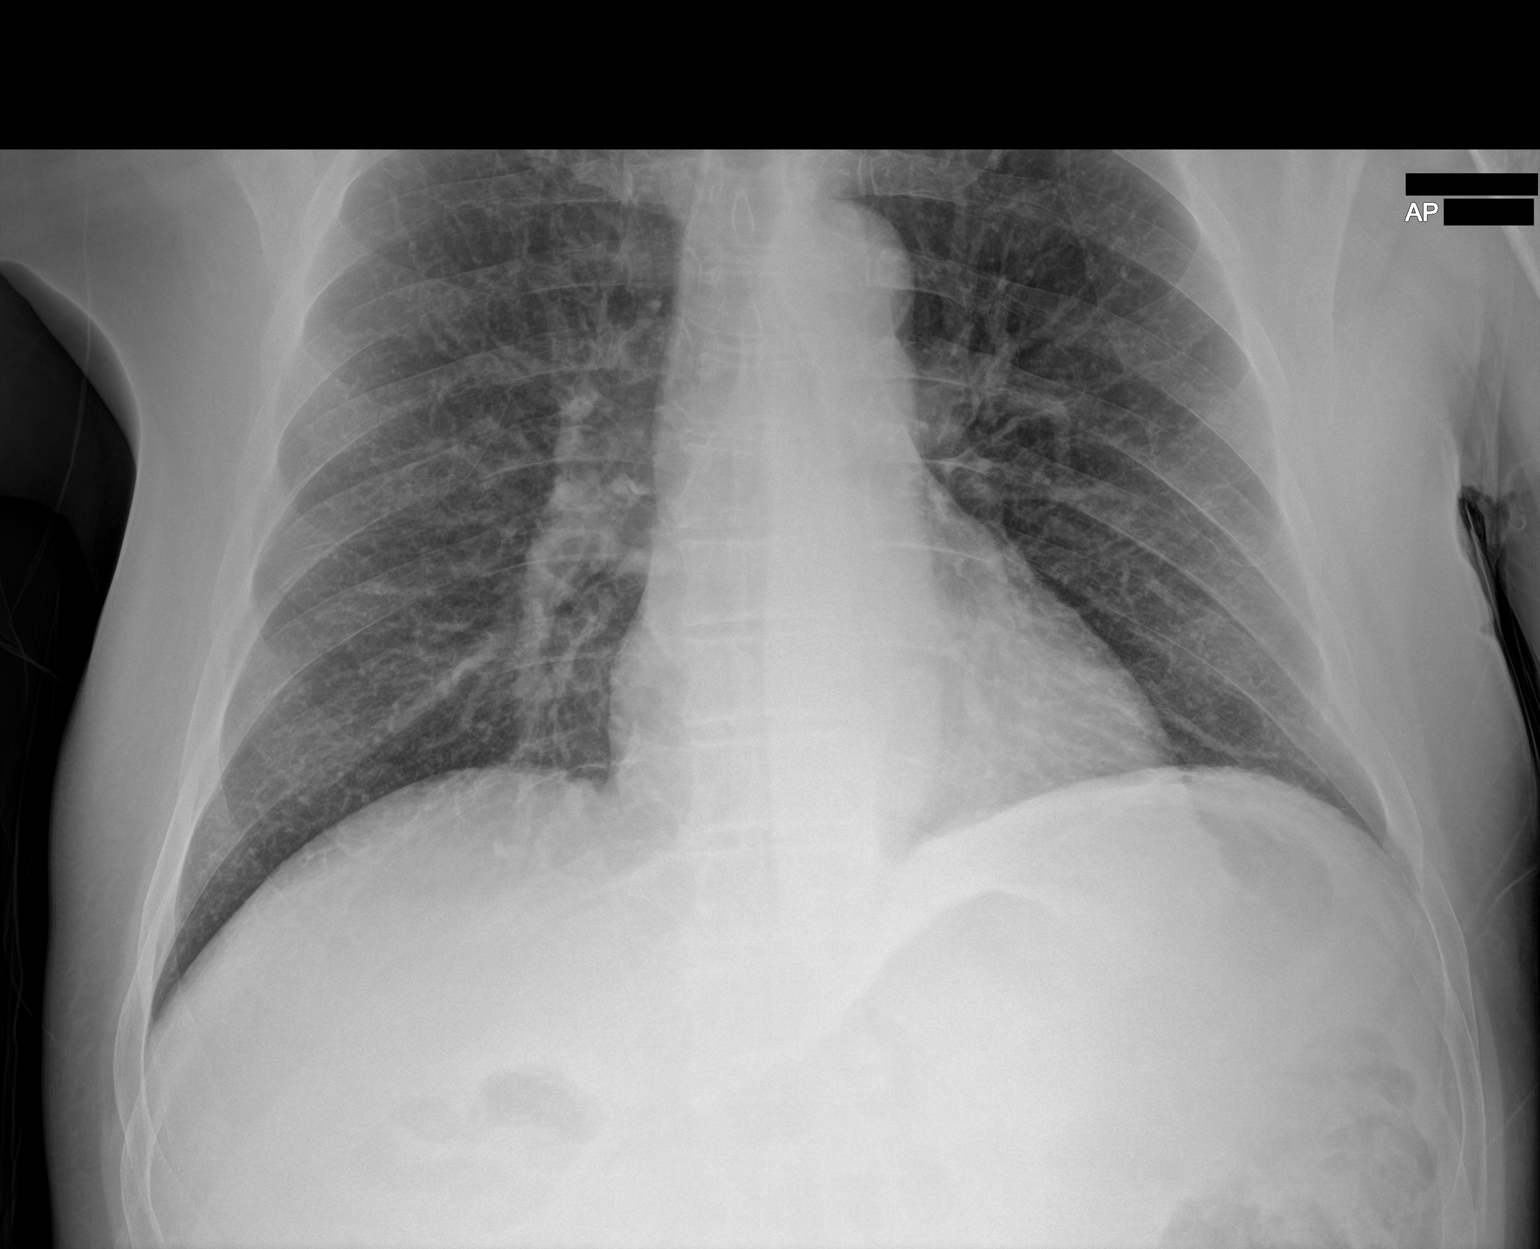

[2 of 2 positions shown; findings below may reference images not displayed]

FINDINGS: Unchanged cardiomediastinal silhouette. No focal airspace
consolidation. No large pleural effusion or visible pneumothorax. No
acute osseous abnormality. Partially visualized cervical spine
fusion hardware.
IMPRESSION: No evidence of acute cardiopulmonary disease.

## 2020-11-21 MED ORDER — SUCRALFATE 1 G PO TABS: 1.0000 g | ORAL_TABLET | Freq: Three times a day (TID) | ORAL | 0 refills | Status: DC

## 2020-11-21 MED ORDER — LIDOCAINE VISCOUS HCL 2 % MT SOLN
15.0000 mL | Freq: Once | OROMUCOSAL | Status: AC
  Administered 2020-11-21: 15 mL via ORAL
  Filled 2020-11-21: qty 15

## 2020-11-21 MED ORDER — SODIUM CHLORIDE 0.9 % IV BOLUS
1000.0000 mL | Freq: Once | INTRAVENOUS | Status: AC
  Administered 2020-11-21: 1000 mL via INTRAVENOUS

## 2020-11-21 MED ORDER — IOHEXOL 300 MG/ML  SOLN
100.0000 mL | Freq: Once | INTRAMUSCULAR | Status: AC | PRN
  Administered 2020-11-21: 100 mL via INTRAVENOUS
  Filled 2020-11-21: qty 100

## 2020-11-21 MED ORDER — ALUM & MAG HYDROXIDE-SIMETH 200-200-20 MG/5ML PO SUSP
30.0000 mL | Freq: Once | ORAL | Status: AC
  Administered 2020-11-21: 30 mL via ORAL
  Filled 2020-11-21: qty 30

## 2020-11-21 MED ORDER — ONDANSETRON 4 MG PO TBDP: 4.0000 mg | ORAL_TABLET | Freq: Three times a day (TID) | ORAL | 0 refills | Status: DC | PRN

## 2020-11-21 MED ORDER — ONDANSETRON HCL 4 MG/2ML IJ SOLN
4.0000 mg | Freq: Once | INTRAMUSCULAR | Status: AC
  Administered 2020-11-21: 4 mg via INTRAVENOUS
  Filled 2020-11-21: qty 2

## 2020-11-21 NOTE — ED Provider Notes (Signed)
Emergency Medicine Provider Triage Evaluation Note  Charles Marks , a 65 y.o. male  was evaluated in triage.  Pt complains of abdominal cramping that started yesterday. Awakened with vomiting and diarrhea. No relief with Pepto Bismol. Has also had runny nose starting yesterday as well.  Review of Systems  Positive: Abdominal pain, vomiting, diarrhea Negative: Fever  Physical Exam  There were no vitals taken for this visit. Gen:   Awake, no distress   Resp:  Normal effort  MSK:   Moves extremities without difficulty  Other:    Medical Decision Making  Medically screening exam initiated at 11:48 AM.  Appropriate orders placed.  Charles Marks was informed that the remainder of the evaluation will be completed by another provider, this initial triage assessment does not replace that evaluation, and the importance of remaining in the ED until their evaluation is complete.   Victorino Dike, FNP 11/21/20 1153    [name], MD 11/21/20 1157

## 2020-11-21 NOTE — ED Notes (Signed)
Patient transported to CT 

## 2020-11-21 NOTE — ED Triage Notes (Addendum)
Pt reports that he developed ab pain last night and at 0300 he woke up with vomiting. He feels like there is a vice on his stomach. Denies chest pain and SHOB. He took a little pepto bismol but it has not helped.

## 2020-11-21 NOTE — ED Provider Notes (Signed)
[facility] Emergency Department Provider Note  ____________________________________________   Event Date/Time   First MD Initiated Contact with Patient 11/21/20 1225     (approximate)  I have reviewed the triage vital signs and the nursing notes.   HISTORY  Chief Complaint Abdominal Pain    HPI Charles Marks is a 65 y.o. male with type 2 diabetes, seen by myself previously for a subdural hematoma who now comes in with abdominal pain.  Patient reports starting yesterday having lower abdominal pain, constant, nothing makes it better or worse now associated with some nausea and vomiting.  Denies any upper abdominal pain although he does state that he feels a little bit of reflux symptoms up into his chest.  Denies any shortness of breath.  Reports having 4 normal bowel movements today.  Has his COVID vaccines.  Has not had COVID recently.  Denies any shortness of breath.  Patient denies any falls, hitting his head, headaches.  He states that is mostly just the lower abdominal pain.  Reports having this previously years ago and it just got better after coming to the ER but he never could figure out what it was.       Past Medical History:  Diagnosis Date   Depression    Frequency of urination    since radiation tx   GERD (gastroesophageal reflux disease)    Headache    History of traumatic head injury    age 89--  head injury w/ fractured skull in coma for 3 days made full recovery withou further treatment--  no residual   Nocturia    Prostate cancer [facility]) urologist-  dr ottelin/  oncologist- dr [name]   T1c, Gleason 4+4,  PSA 14.24,  vol 25.6cc/  IM radiation therapy (05-23-2015 to 06-27-2015)     S/P radiation therapy 05-23-2015  to  06-27-2015   prostatic fossa  68.4 Gy in 38 fractions in 1.8 Gy   Subdural hematoma 07/29/2020   Type 2 diabetes, diet controlled ([place])    Ulcer    Wears glasses     Patient Active Problem List   Diagnosis Date  Noted   Unresponsiveness 07/29/2020   Subdural hematoma 07/29/2020   Malignant neoplasm of prostate ([place]) 03/23/2015    Past Surgical History:  Procedure Laterality Date   ANTERIOR CERVICAL DECOMP/DISCECTOMY FUSION N/A 09/13/2020   Procedure: C5-6 ANTERIOR CERVICAL DECOMPRESSION/DISCECTOMY FUSION 1 LEVEL;  Surgeon: Meade Maw, MD;  Location: [facility] ORS;  Service: Neurosurgery;  Laterality: N/A;   APPENDECTOMY  01/21/1973   COLONOSCOPY  2012 approx   COLONOSCOPY WITH PROPOFOL N/A 04/29/2016   Procedure: COLONOSCOPY WITH PROPOFOL;  Surgeon: Manya Silvas, MD;  [facility]: [facility] ENDOSCOPY;  Service: Endoscopy;  Laterality: N/A;   CRANIOTOMY Right 07/29/2020   Procedure: CRANIOTOMY HEMATOMA EVACUATION SUBDURAL;  Surgeon: Meade Maw, MD;  Location: [facility] ORS;  Service: Neurosurgery;  Laterality: Right;   CYSTOSCOPY N/A 07/18/2015   Procedure: CYSTOSCOPY FLEXIBLE;  Surgeon: Kathie Rhodes, MD;  Location: [facility];  Service: Urology;  Laterality: N/A;  no seeds found in bladder   PROSTATE BIOPSY     RADIOACTIVE SEED IMPLANT N/A 07/18/2015   Procedure: RADIOACTIVE SEED IMPLANT/BRACHYTHERAPY IMPLANT;  Surgeon: Kathie Rhodes, MD;  Location: [facility];  Service: Urology;  Laterality: N/A;     48  seeds implanted   SUBDURAL HEMATOMA EVACUATION VIA CRANIOTOMY Right 07/29/2020    Prior to Admission medications   Medication Sig Start Date End Date Taking? Authorizing  Provider  atorvastatin (LIPITOR) 20 MG tablet Take 20 mg by mouth daily. 08/16/20 08/16/21  [provider]  CALCIUM-VITAMIN D PO Take 1 tablet by mouth daily.    [provider]  Cholecalciferol (VITAMIN D3) 1000 units CAPS Take 1 capsule by mouth daily.     [provider]  Erenumab-aooe (AIMOVIG, 140 MG DOSE, [place]) Inject 1 Dose into the skin every 30 (thirty) days.    [provider]  gabapentin (NEURONTIN) 300 MG capsule Take 300 mg by mouth 2 (two) times  daily. Morning and lunch 07/01/20   [provider]  gabapentin (NEURONTIN) 400 MG capsule Take 800 mg by mouth every evening. 08/28/20 08/28/21  [provider]  Lidocaine HCl 4 % CREA Apply 1 application topically daily as needed (pain).    [provider]  Menthol (ICY HOT) 5 % PTCH Place 1 application onto the skin daily as needed (pain).    [provider]  methocarbamol (ROBAXIN) 500 MG tablet Take 1 tablet (500 mg total) by mouth 4 (four) times daily. 09/13/20   Loleta Dicker, PA  Multiple Vitamin (MULTI-VITAMINS) TABS Take 1 tablet by mouth daily.     [provider]  omeprazole (PRILOSEC) 20 MG capsule Take 20 mg by mouth daily. 05/14/20   [provider]  QUEtiapine (SEROQUEL XR) 200 MG 24 hr tablet Take 200 mg by mouth at bedtime. 01/06/15   [provider]  QUEtiapine (SEROQUEL) 100 MG tablet Take 100 mg by mouth at bedtime.    [provider]  senna (SENOKOT) 8.6 MG TABS tablet Take 1 tablet (8.6 mg total) by mouth daily as needed for mild constipation. 09/13/20   Loleta Dicker, PA  sertraline (ZOLOFT) 100 MG tablet Take 100 mg by mouth in the morning and at bedtime.    [provider]  SUMAtriptan (IMITREX) 100 MG tablet Take 100 mg by mouth every 2 (two) hours as needed for migraine. 06/20/15   [provider]  verapamil (CALAN) 120 MG tablet Take 120 mg by mouth daily. 07/10/20   [provider]    Allergies Patient has no known allergies.  Family History  Problem Relation Age of Onset   Cancer Mother        brain tumor   Multiple sclerosis Mother    Lung cancer Father    Lung cancer Sister    Diabetes Sister     Social History Social History   Tobacco Use   Smoking status: Never   Smokeless tobacco: Never  Vaping Use   Vaping Use: Never used  Substance Use Topics   Alcohol use: Not Currently    Comment: occasional beer/wine    Drug use: No      Review of  Systems Constitutional: No fever/chills Eyes: No visual changes. ENT: No sore throat. Cardiovascular: Some reflux Respiratory: Denies shortness of breath. Gastrointestinal: Positive abdominal pain, nausea, vomiting Genitourinary: Negative for dysuria. Musculoskeletal: Negative for back pain. Skin: Negative for rash. Neurological: Negative for headaches, focal weakness or numbness. All other ROS negative ____________________________________________   PHYSICAL EXAM:  VITAL SIGNS: ED Triage Vitals  Enc Vitals Group     BP 11/21/20 1151 112/72     Pulse Rate 11/21/20 1151 (!) 114     Resp 11/21/20 1151 20     Temp 11/21/20 1151 98.6 F (37 C)     Temp Source 11/21/20 1151 Oral     SpO2 11/21/20 1151 95 %  Weight 11/21/20 1152 184 lb 4.9 oz (83.6 kg)     Height 11/21/20 1152 6\' 3"  (1.905 m)     Head Circumference --      Peak Flow --      Pain Score 11/21/20 1152 10     Pain Loc --      Pain Edu? --      Excl. in [place]? --     Constitutional: Alert and oriented. Well appearing and in no acute distress. Eyes: Conjunctivae are normal. EOMI. Head: Well-healing scars from prior subdural trauma Nose: No congestion/rhinnorhea. Mouth/Throat: Mucous membranes are moist.   Neck: No stridor. Trachea Midline. FROM Cardiovascular: Tachycardic, regular rhythm. Grossly normal heart sounds.  Good peripheral circulation. Respiratory: Normal respiratory effort.  No retractions. Lungs CTAB. Gastrointestinal: Tender to the lower abdomen no distention. No abdominal bruits.  Musculoskeletal: No lower extremity tenderness nor edema.  No joint effusions. Neurologic:  Normal speech and language. No gross focal neurologic deficits are appreciated.  Skin:  Skin is warm, dry and intact. No rash noted. Psychiatric: Mood and affect are normal. Speech and behavior are normal. GU: Deferred   ____________________________________________   LABS (all labs ordered are listed, but only abnormal results  are displayed)  Labs Reviewed  COMPREHENSIVE METABOLIC PANEL - Abnormal; Notable for the following components:      Result Value   Glucose, Bld 141 (*)    Calcium 10.4 (*)    Albumin 5.1 (*)    Total Bilirubin 1.3 (*)    All other components within normal limits  CBC WITH DIFFERENTIAL/PLATELET - Abnormal; Notable for the following components:   Neutro Abs 9.3 (*)    Lymphs Abs 0.4 (*)    All other components within normal limits  LIPASE, BLOOD - Abnormal; Notable for the following components:   Lipase 60 (*)    All other components within normal limits  RESP PANEL BY RT-PCR (FLU A&B, COVID) ARPGX2  URINALYSIS, ROUTINE W REFLEX MICROSCOPIC   ____________________________________________   ED ECG REPORT I, Vanessa [place], the attending physician, personally viewed and interpreted this ECG.  Normal sinus rate of 90, no ST elevation, no T wave inversions except for lead III, normal intervals ____________________________________________  RADIOLOGY Robert Bellow, personally viewed and evaluated these images (plain radiographs) as part of my medical decision making, as well as reviewing the written report by the radiologist.  ED MD interpretation: No pneumonia  Official radiology report(s): DG Chest Portable 1 View  Result Date: 11/21/2020 CLINICAL DATA:  Chest pain EXAM: PORTABLE CHEST 1 VIEW COMPARISON:  Radiograph 07/29/2020 FINDINGS: Unchanged cardiomediastinal silhouette. No focal airspace consolidation. No large pleural effusion or visible pneumothorax. No acute osseous abnormality. Partially visualized cervical spine fusion hardware. IMPRESSION: No evidence of acute cardiopulmonary disease. Electronically Signed   By: Maurine Simmering M.D.   On: 11/21/2020 13:25    ____________________________________________   PROCEDURES  Procedure(s) performed (including Critical Care):  Procedures   ____________________________________________   INITIAL IMPRESSION / ASSESSMENT AND  PLAN / ED COURSE  Brance Dartt was evaluated in Emergency Department on 11/21/2020 for the symptoms described in the history of present illness. He was evaluated in the context of the global COVID-19 pandemic, which necessitated consideration that the patient might be at risk for infection with the SARS-CoV-2 virus that causes COVID-19. Institutional protocols and algorithms that pertain to the evaluation of patients at risk for COVID-19 are in a state of rapid change based on information released by regulatory bodies including  the [employer] and federal and state organizations. These policies and algorithms were followed during the patient's care in the ED.    Patient comes in with lower abdominal pain, nausea, vomiting x1.  Patient is very well-appearing but given the lower abdominal tenderness will get CT scan to evaluate for any diverticulitis, abscess, perforation.  Does not sound like SBO given still having bowel movements.  Patient does report a little bit of like acid reflux symptoms up into his chest but denies any significant chest pain however will get EKG and cardiac markers to evaluate for ACS.  We will give the patient some fluids and Zofran.  Declines anything for pain  UA with little ketones in it patient is already getting fluids.  COVID, flu is negative, T bili is slightly elevated but, similar to what he has had previously and patient is not tender in the right upper upper quadrant I do not feel like this is related to gallbladder pathology.   CT scan was negative otherwise concern for esophagitis.  Did provide a copy of report.  Patient is already on a PPI.  Will prescribe some Carafate, Zofran to help with symptoms.  Patient is feeling much better.  His repeat cardiac marker was negative and he feels comfortable going home at this time.  They understand need to follow-up with a GI doctor for possible endoscopy  I discussed the provisional nature of ED diagnosis, the treatment so far, the  ongoing plan of care, follow up appointments and return precautions with the patient and any family or support people present. They expressed understanding and agreed with the plan, discharged home.           ____________________________________________   FINAL CLINICAL IMPRESSION(S) / ED DIAGNOSES   Final diagnoses:  Esophagitis      MEDICATIONS GIVEN DURING THIS VISIT:  Medications  sodium chloride 0.9 % bolus 1,000 mL (0 mLs Intravenous Stopped 11/21/20 1428)  ondansetron (ZOFRAN) injection 4 mg (4 mg Intravenous Given 11/21/20 1334)  iohexol (OMNIPAQUE) 300 MG/ML solution 100 mL (100 mLs Intravenous Contrast Given 11/21/20 1430)  alum & mag hydroxide-simeth (MAALOX/MYLANTA) 200-200-20 MG/5ML suspension 30 mL (30 mLs Oral Given 11/21/20 1506)    And  lidocaine (XYLOCAINE) 2 % viscous mouth solution 15 mL (15 mLs Oral Given 11/21/20 1506)     ED Discharge Orders          Ordered    sucralfate (CARAFATE) 1 g tablet  3 times daily with meals & bedtime        11/21/20 1558    ondansetron (ZOFRAN ODT) 4 MG disintegrating tablet  Every 8 hours PRN        11/21/20 1558             Note:  This document was prepared using Dragon voice recognition software and may include unintentional dictation errors.    Vanessa Bowen, MD 11/21/20 1600

## 2020-11-21 NOTE — ED Notes (Signed)
MD at bedside. 

## 2020-11-21 NOTE — Discharge Instructions (Addendum)
Continue taking your acid reducer can take the Carafate to help line the stomach/esophagus and the Zofran to help with nausea.  Follow-up with GI to discuss if you need an endoscopy.  Return to the ER for worsening symptoms or any other concerns  IMPRESSION: 1. Distal esophagus appears thick walled. Consider esophagitis consistent clinical findings. 2. No acute findings within the abdomen or pelvis. No bowel obstruction or inflammation. 3. Mild aortic atherosclerosis.

## 2021-01-08 ENCOUNTER — Ambulatory Visit: Payer: Federal, State, Local not specified - PPO | Admitting: Dermatology

## 2021-01-08 ENCOUNTER — Other Ambulatory Visit: Payer: Self-pay

## 2021-01-08 DIAGNOSIS — L814 Other melanin hyperpigmentation: Secondary | ICD-10-CM | POA: Diagnosis not present

## 2021-01-08 DIAGNOSIS — D485 Neoplasm of uncertain behavior of skin: Secondary | ICD-10-CM

## 2021-01-08 DIAGNOSIS — L920 Granuloma annulare: Secondary | ICD-10-CM | POA: Diagnosis not present

## 2021-01-08 DIAGNOSIS — L720 Epidermal cyst: Secondary | ICD-10-CM | POA: Diagnosis not present

## 2021-01-08 MED ORDER — MUPIROCIN 2 % EX OINT: 1.0000 "application " | TOPICAL_OINTMENT | Freq: Every day | CUTANEOUS | 0 refills | Status: DC

## 2021-01-08 NOTE — Progress Notes (Addendum)
° °  New patient Visit   Subjective  Charles Marks is a 65 y.o. male who presents for the following: Skin Problem (Patient here today for a new spot he noticed about 2 weeks ago at the right arm. No symptoms with it. ). The patient has spots, moles and lesions to be evaluated, some may be new or changing and the patient has concerns that these could be cancer.  The following portions of the chart were reviewed this encounter and updated as appropriate:   Tobacco   Allergies   Meds   Problems   Med Hx   Surg Hx   Fam Hx      Review of Systems:  No other skin or systemic complaints except as noted in HPI or Assessment and Plan.  Objective  Well appearing patient in no apparent distress; mood and affect are within normal limits.  A focused examination was performed including right arm. Relevant physical exam findings are noted in the Assessment and Plan.  Right lateral deltoid 1.3 cm pink indurated plaque       Assessment & Plan  Neoplasm of uncertain behavior of skin Right lateral deltoid  mupirocin ointment (BACTROBAN) 2 % Apply 1 application topically daily. With dressing changes  Skin / nail biopsy Type of biopsy: tangential   Informed consent: discussed and consent obtained   Timeout: patient name, date of birth, surgical site, and procedure verified   Procedure prep:  Patient was prepped and draped in usual sterile fashion Prep type:  Isopropyl alcohol Anesthesia: the lesion was anesthetized in a standard fashion   Anesthetic:  1% lidocaine w/ epinephrine 1-100,000 buffered w/ 8.4% NaHCO3 Instrument used: flexible razor blade   Hemostasis achieved with: pressure, aluminum chloride and electrodesiccation   Outcome: patient tolerated procedure well   Post-procedure details: sterile dressing applied and wound care instructions given   Dressing type: petrolatum and bandage    Specimen 1 - Surgical pathology Differential Diagnosis: r/o BCC  Check Margins: No 1.3 cm pink  indurated plaque  Start mupirocin daily and cover with band-aid.   Cyst R posterior deltoid Benign-appearing. Exam most consistent with an epidermal inclusion cyst. Discussed that a cyst is a benign growth that can grow over time and sometimes get irritated or inflamed. Recommend observation if it is not bothersome. Discussed option of surgical excision to remove it if it is growing, symptomatic, or other changes noted. Please call for new or changing lesions so they can be evaluated.  Lentigines - Scattered tan macules - Due to sun exposure - Benign-appering, observe - Recommend daily broad spectrum sunscreen SPF 30+ to sun-exposed areas, reapply every 2 hours as needed. - Call for any changes  Return if symptoms worsen or fail to improve.  Graciella Belton, RMA, am acting as scribe for Sarina Ser, MD . Documentation: I have reviewed the above documentation for accuracy and completeness, and I agree with the above.  Sarina Ser, MD

## 2021-01-08 NOTE — Patient Instructions (Addendum)

## 2021-01-11 ENCOUNTER — Encounter: Payer: Self-pay | Admitting: Dermatology

## 2021-01-15 NOTE — Addendum Note (Signed)
Addended by: Ralene Bathe on: 01/15/2021 08:51 PM   Modules accepted: Level of Service

## 2021-01-18 ENCOUNTER — Telehealth: Payer: Self-pay

## 2021-01-18 NOTE — Telephone Encounter (Signed)
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 May also inject with steroid if pt wants to make appt for that. May resolve on its own, but also may recur.

## 2021-01-18 NOTE — Telephone Encounter (Signed)
Discussed biopsy results with pt, pt using Triamcinolone cream

## 2021-01-23 ENCOUNTER — Other Ambulatory Visit: Payer: Self-pay

## 2021-01-23 DIAGNOSIS — K219 Gastro-esophageal reflux disease without esophagitis: Secondary | ICD-10-CM | POA: Insufficient documentation

## 2021-01-23 DIAGNOSIS — E119 Type 2 diabetes mellitus without complications: Secondary | ICD-10-CM | POA: Insufficient documentation

## 2021-01-24 ENCOUNTER — Ambulatory Visit (INDEPENDENT_AMBULATORY_CARE_PROVIDER_SITE_OTHER): Payer: Federal, State, Local not specified - PPO | Admitting: Gastroenterology

## 2021-01-24 ENCOUNTER — Encounter: Payer: Self-pay | Admitting: Gastroenterology

## 2021-01-24 ENCOUNTER — Other Ambulatory Visit: Payer: Self-pay

## 2021-01-24 VITALS — BP 126/67 | HR 73 | Temp 98.9°F | Ht 75.0 in | Wt 202.0 lb

## 2021-01-24 DIAGNOSIS — R933 Abnormal findings on diagnostic imaging of other parts of digestive tract: Secondary | ICD-10-CM

## 2021-01-24 NOTE — Addendum Note (Signed)
Addended by: Wayna Chalet on: 01/24/2021 11:15 AM   Modules accepted: Orders

## 2021-01-24 NOTE — Progress Notes (Signed)
Jonathon Bellows MD, MRCP(U.K) [address]  [place]  [place], [place] [address]  Main: [phone]  Fax: [phone]   Gastroenterology Consultation  Referring Provider:     Maryland Pink, MD Primary Care Physician:  Maryland Pink, MD Primary Gastroenterologist:  Dr. Jonathon Bellows  Reason for Consultation:     ED referral        HPI:   Charles Marks is a 66 y.o. y/o male referred for consultation & management  by Dr. Maryland Pink, MD.    He recently presented to the ER on 11/21/2020 with abdominal pain, had symptoms suggestive of reflux discomfort of the lower abdomen.  Underwent CT scan of the abdomen in the ER which showed thickening of the distal esophagus consider esophagitis.  Patient discharged advised to follow-up with GI.  He states he is here today just to follow-up on these abnormal results.  Denies any dysphagia.  Denies any weight loss.  Denies any heartburn symptoms.  He is on Prilosec which he takes daily.  Denies any other GI complaints at this point of time.  Has had prior endoscopies in the past.  Past Medical History:  Diagnosis Date   Depression    Frequency of urination    since radiation tx   GERD (gastroesophageal reflux disease)    Headache    History of traumatic head injury    age 27--  head injury w/ fractured skull in coma for 3 days made full recovery withou further treatment--  no residual   Nocturia    Prostate cancer [facility]) urologist-  dr ottelin/  oncologist- dr [name]   T1c, Gleason 4+4,  PSA 14.24,  vol 25.6cc/  IM radiation therapy (05-23-2015 to 06-27-2015)     S/P radiation therapy 05-23-2015  to  06-27-2015   prostatic fossa  68.4 Gy in 38 fractions in 1.8 Gy   Subdural hematoma 07/29/2020   Type 2 diabetes, diet controlled ([place])    Ulcer    Wears glasses     Past Surgical History:  Procedure Laterality Date   ANTERIOR CERVICAL DECOMP/DISCECTOMY FUSION N/A 09/13/2020   Procedure: C5-6 ANTERIOR CERVICAL DECOMPRESSION/DISCECTOMY  FUSION 1 LEVEL;  Surgeon: Meade Maw, MD;  Location: [facility] ORS;  Service: Neurosurgery;  Laterality: N/A;   APPENDECTOMY  01/21/1973   COLONOSCOPY  2012 approx   COLONOSCOPY WITH PROPOFOL N/A 04/29/2016   Procedure: COLONOSCOPY WITH PROPOFOL;  Surgeon: Manya Silvas, MD;  Location: [facility] ENDOSCOPY;  Service: Endoscopy;  Laterality: N/A;   CRANIOTOMY Right 07/29/2020   Procedure: CRANIOTOMY HEMATOMA EVACUATION SUBDURAL;  Surgeon: Meade Maw, MD;  Location: [facility] ORS;  Service: Neurosurgery;  Laterality: Right;   CYSTOSCOPY N/A 07/18/2015   Procedure: CYSTOSCOPY FLEXIBLE;  Surgeon: Kathie Rhodes, MD;  Location: [facility];  Service: Urology;  Laterality: N/A;  no seeds found in bladder   PROSTATE BIOPSY     RADIOACTIVE SEED IMPLANT N/A 07/18/2015   Procedure: RADIOACTIVE SEED IMPLANT/BRACHYTHERAPY IMPLANT;  Surgeon: Kathie Rhodes, MD;  Location: [facility];  Service: Urology;  Laterality: N/A;     48  seeds implanted   SUBDURAL HEMATOMA EVACUATION VIA CRANIOTOMY Right 07/29/2020    Prior to Admission medications   Medication Sig Start Date End Date Taking? Authorizing Provider  aspirin 81 MG EC tablet Take 1 tablet by mouth daily.    [provider]  atorvastatin (LIPITOR) 20 MG tablet Take 1 tablet by mouth at bedtime. 10/12/20 10/12/21  [provider]  CALCIUM-VITAMIN D  PO Take 1 tablet by mouth daily.    [provider]  celecoxib (CELEBREX) 100 MG capsule Take 100 mg by mouth 2 (two) times daily. 09/21/20   [provider]  Cholecalciferol (VITAMIN D3) 1000 units CAPS Take 1 capsule by mouth daily.     [provider]  Erenumab-aooe (AIMOVIG, 140 MG DOSE, [place]) Inject 1 Dose into the skin every 30 (thirty) days.    [provider]  gabapentin (NEURONTIN) 300 MG capsule Take 2 capsules by mouth 2 (two) times daily. 10/31/20   [provider]  gabapentin (NEURONTIN) 400 MG capsule Take 2  capsules by mouth at bedtime. 12/20/20 12/20/21  [provider]  levETIRAcetam (KEPPRA) 1000 MG tablet Take 1,000 mg by mouth 2 (two) times daily. 01/10/21   [provider]  methocarbamol (ROBAXIN) 750 MG tablet Take 750 mg by mouth 3 (three) times daily. 12/08/20   [provider]  methylPREDNISolone (MEDROL DOSEPAK) 4 MG TBPK tablet Take by mouth. 09/21/20   [provider]  Multiple Vitamin (MULTI-VITAMINS) TABS Take 1 tablet by mouth daily.     [provider]  mupirocin ointment (BACTROBAN) 2 % Apply 1 application topically daily. With dressing changes 01/08/21   Ralene Bathe, MD  omeprazole (PRILOSEC) 20 MG capsule Take 20 mg by mouth daily. 05/14/20   [provider]  oxyCODONE-acetaminophen (PERCOCET) 7.5-325 MG tablet Take 1 tablet by mouth 4 (four) times daily as needed. 10/16/20   [provider]  QUEtiapine (SEROQUEL XR) 200 MG 24 hr tablet Take 200 mg by mouth at bedtime. 01/06/15   [provider]  sertraline (ZOLOFT) 100 MG tablet Take 100 mg by mouth in the morning and at bedtime.    [provider]  SUMAtriptan Succinate Refill 6 MG/0.5ML SOCT Inject into the skin. 01/10/21 01/10/22  [provider]  verapamil (CALAN) 120 MG tablet Take 120 mg by mouth daily. 07/10/20   [provider]  zolpidem (AMBIEN) 5 MG tablet Take 5 mg by mouth at bedtime as needed. 09/05/20   [provider]    Family History  Problem Relation Age of Onset   Cancer Mother        brain tumor   Multiple sclerosis Mother    Lung cancer Father    Lung cancer Sister    Diabetes Sister      Social History   Tobacco Use   Smoking status: Never   Smokeless tobacco: Never  Vaping Use   Vaping Use: Never used  Substance Use Topics   Alcohol use: Not Currently    Comment: occasional beer/wine    Drug use: No    Allergies as of 01/24/2021   (No Known Allergies)    Review of Systems:     All systems reviewed and negative except where noted in HPI.   Physical Exam:  There were no vitals taken for this visit. No LMP for male patient. Psych:  Alert and cooperative. Normal mood and affect. General:   Alert,  Well-developed, well-nourished, pleasant and cooperative in NAD Head:  Normocephalic and atraumatic. Eyes:  Sclera clear, no icterus.   Conjunctiva pink. Neurologic:  Alert and oriented x3;  grossly normal neurologically. Psych:  Alert and cooperative. Normal mood and affect.  Imaging Studies: No results found.  Assessment and Plan:   Charles Marks is a 66 y.o. y/o male was seen in the emergency room in November 2022 with abdominal discomfort and underwent a CT scan of the  abdomen which showed thickening of the lower end of the esophagus concerning for esophagitis.  Patient was subsequently suggested to follow-up with GI for further evaluation required endoscopy.  Plan 1.  EGD 2.  Continue PPI   I have discussed alternative options, risks & benefits,  which include, but are not limited to, bleeding, infection, perforation,respiratory complication & drug reaction.  The patient agrees with this plan & written consent will be obtained.     Follow up as needed Dr Jonathon Bellows MD,MRCP(U.K)

## 2021-02-05 ENCOUNTER — Encounter
Admission: RE | Disposition: A | Discharge status: Home / Self Care | Payer: Self-pay | Source: Home / Self Care | Attending: Gastroenterology

## 2021-02-05 ENCOUNTER — Encounter: Payer: Self-pay | Admitting: Gastroenterology

## 2021-02-05 ENCOUNTER — Ambulatory Visit: Payer: Federal, State, Local not specified - PPO | Admitting: Certified Registered Nurse Anesthetist

## 2021-02-05 ENCOUNTER — Ambulatory Visit
Admission: RE | Admit: 2021-02-05 | Discharge: 2021-02-05 | Disposition: A | Discharge status: Home / Self Care | Payer: Federal, State, Local not specified - PPO | Attending: Gastroenterology | Admitting: Gastroenterology

## 2021-02-05 DIAGNOSIS — K219 Gastro-esophageal reflux disease without esophagitis: Secondary | ICD-10-CM | POA: Diagnosis not present

## 2021-02-05 DIAGNOSIS — R933 Abnormal findings on diagnostic imaging of other parts of digestive tract: Secondary | ICD-10-CM | POA: Diagnosis present

## 2021-02-05 DIAGNOSIS — K449 Diaphragmatic hernia without obstruction or gangrene: Secondary | ICD-10-CM | POA: Insufficient documentation

## 2021-02-05 DIAGNOSIS — F32A Depression, unspecified: Secondary | ICD-10-CM | POA: Insufficient documentation

## 2021-02-05 DIAGNOSIS — Z8546 Personal history of malignant neoplasm of prostate: Secondary | ICD-10-CM | POA: Diagnosis not present

## 2021-02-05 DIAGNOSIS — E119 Type 2 diabetes mellitus without complications: Secondary | ICD-10-CM | POA: Insufficient documentation

## 2021-02-05 DIAGNOSIS — K2289 Other specified disease of esophagus: Secondary | ICD-10-CM | POA: Diagnosis not present

## 2021-02-05 HISTORY — PX: ESOPHAGOGASTRODUODENOSCOPY (EGD) WITH PROPOFOL: SHX5813

## 2021-02-05 SURGERY — ESOPHAGOGASTRODUODENOSCOPY (EGD) WITH PROPOFOL
Anesthesia: General

## 2021-02-05 MED ORDER — PROPOFOL 10 MG/ML IV BOLUS
INTRAVENOUS | Status: DC | PRN
Start: 2021-02-05 — End: 2021-02-05
  Administered 2021-02-05: 60 mg via INTRAVENOUS

## 2021-02-05 MED ORDER — PROPOFOL 500 MG/50ML IV EMUL
INTRAVENOUS | Status: DC | PRN
  Administered 2021-02-05: 120 ug/kg/min via INTRAVENOUS

## 2021-02-05 MED ORDER — LIDOCAINE HCL (CARDIAC) PF 100 MG/5ML IV SOSY
PREFILLED_SYRINGE | INTRAVENOUS | Status: DC | PRN
  Administered 2021-02-05: 50 mg via INTRAVENOUS

## 2021-02-05 MED ORDER — SODIUM CHLORIDE 0.9 % IV SOLN
INTRAVENOUS | Status: DC
  Administered 2021-02-05: 20 mL/h via INTRAVENOUS

## 2021-02-05 NOTE — Op Note (Signed)
[facility] Gastroenterology Patient Name: Kaiyden Marks Procedure Date: 02/05/2021 10:47 AM MRN: [id number] Account #: [id number] Date of Birth: April 28, 1955 Admit Type: Outpatient Age: 66 Room: [facility] ENDO ROOM 4 Gender: Male Note Status: Finalized Instrument Name: Upper Endoscope [id number] Procedure:             Upper GI endoscopy Indications:           Abnormal CT of the GI tract Providers:             Jonathon Bellows MD, MD Referring MD:          Irven Easterly. Kary Kos, MD (Referring MD) Medicines:             Monitored Anesthesia Care Complications:         No immediate complications. Procedure:             Pre-Anesthesia Assessment:                        - Prior to the procedure, a History and Physical was                         performed, and patient medications, allergies and                         sensitivities were reviewed. The patient's tolerance                         of previous anesthesia was reviewed.                        - The risks and benefits of the procedure and the                         sedation options and risks were discussed with the                         patient. All questions were answered and informed                         consent was obtained.                        - ASA Grade Assessment: II - A patient with mild                         systemic disease.                        After obtaining informed consent, the endoscope was                         passed under direct vision. Throughout the procedure,                         the patient's blood pressure, pulse, and oxygen                         saturations were monitored continuously. The Endoscope  was introduced through the mouth, and advanced to the                         third part of duodenum. The upper GI endoscopy was                         accomplished with ease. The patient tolerated the                         procedure well. Findings:      The  examined duodenum was normal.      The stomach was normal.      A medium-sized hiatal hernia was present.      The Z-line was irregular and was found at the gastroesophageal junction.       Biopsies were taken with a cold forceps for histology.      The exam was otherwise without abnormality. Impression:            - Normal examined duodenum.                        - Normal stomach.                        - Medium-sized hiatal hernia.                        - Z-line irregular, at the gastroesophageal junction.                         Biopsied.                        - The examination was otherwise normal. Recommendation:        - Await pathology results.                        - Discharge patient to home (with escort).                        - Resume previous diet.                        - Continue present medications.                        - Return to my office PRN. Procedure Code(s):     --- Professional ---                        [phone], Esophagogastroduodenoscopy, flexible,                         transoral; with biopsy, single or multiple Diagnosis Code(s):     --- Professional ---                        K44.9, Diaphragmatic hernia without obstruction or                         gangrene  K22.8, Other specified diseases of esophagus                        R93.3, Abnormal findings on diagnostic imaging of                         other parts of digestive tract CPT copyright 2019 American Medical Association. All rights reserved. The codes documented in this report are preliminary and upon coder review may  be revised to meet current compliance requirements. Jonathon Bellows, MD Jonathon Bellows MD, MD 02/05/2021 11:06:46 AM This report has been signed electronically. Number of Addenda: 0 Note Initiated On: 02/05/2021 10:47 AM Estimated Blood Loss:  Estimated blood loss: none.      [facility]

## 2021-02-05 NOTE — Anesthesia Preprocedure Evaluation (Signed)
Anesthesia Evaluation  Patient identified by MRN, date of birth, ID band Patient awake  General Assessment Comment:74mm midline shift from subdural hematoma.  Apparently had a blown pupil, poor mental status, and family declined intervention. Mannitol was given as a last resort, and his mental status improved and he wished for surgical intervention. Dr Cari Caraway relayed to me that he spoke to the patient and family who will perioperatively rescind his DNR.  Reviewed: Allergy & Precautions, NPO status , Patient's Chart, lab work & pertinent test results  History of Anesthesia Complications Negative for: history of anesthetic complications  Airway Mallampati: II  TM Distance: >3 FB Neck ROM: Full    Dental  (+) Teeth Intact, Dental Advidsory Given   Pulmonary neg pulmonary ROS, neg shortness of breath, neg sleep apnea, neg COPD, neg recent URI, Patient abstained from smoking.Not current smoker,    Pulmonary exam normal breath sounds clear to auscultation       Cardiovascular Exercise Tolerance: Good METS(-) hypertension(-) angina(-) CAD and (-) Past MI negative cardio ROS  (-) dysrhythmias  Rhythm:Regular Rate:Normal - Systolic murmurs    Neuro/Psych  Headaches, neg Seizures PSYCHIATRIC DISORDERS Depression    GI/Hepatic GERD  Medicated and Controlled,(+)     (-) substance abuse  ,   Endo/Other  diabetes, Well Controlled  Renal/GU negative Renal ROS     Musculoskeletal   Abdominal   Peds  Hematology   Anesthesia Other Findings Past Medical History: No date: Depression No date: Frequency of urination     Comment:  since radiation tx No date: GERD (gastroesophageal reflux disease) No date: Headache No date: History of traumatic head injury     Comment:  age 66--  head injury w/ fractured skull in coma for 3               days made full recovery withou further treatment--  no               residual No date:  Nocturia urologist-  dr ottelin/  oncologist- dr [name]: Prostate cancer [facility])     Comment:  T1c, Gleason 4+4,  PSA 14.24,  vol 25.6cc/  IM radiation              therapy (05-23-2015 to 06-27-2015)   05-23-2015  to  06-27-2015: S/P radiation therapy     Comment:  prostatic fossa  68.4 Gy in 38 fractions in 1.8 Gy No date: Type 2 diabetes, diet controlled ([place]) No date: Ulcer No date: Wears glasses  Reproductive/Obstetrics                             Anesthesia Physical  Anesthesia Plan  ASA: 2  Anesthesia Plan: General   Post-op Pain Management:    Induction: Intravenous  PONV Risk Score and Plan: 2 and Propofol infusion and TIVA  Airway Management Planned: Natural Airway and Nasal Cannula  Additional Equipment:   Intra-op Plan:   Post-operative Plan:   Informed Consent: I have reviewed the patients History and Physical, chart, labs and discussed the procedure including the risks, benefits and alternatives for the proposed anesthesia with the patient or authorized representative who has indicated his/her understanding and acceptance.   Patient has DNR.   Dental advisory given  Plan Discussed with: CRNA and Surgeon  Anesthesia Plan Comments:         Anesthesia Quick Evaluation

## 2021-02-05 NOTE — Transfer of Care (Signed)
Immediate Anesthesia Transfer of Care Note  Patient: Daimion Adamcik  Procedure(s) Performed: ESOPHAGOGASTRODUODENOSCOPY (EGD) WITH PROPOFOL  Patient Location: PACU  Anesthesia Type:General  Level of Consciousness: drowsy  Airway & Oxygen Therapy: Patient Spontanous Breathing  Post-op Assessment: Report given to RN and Post -op Vital signs reviewed and stable  Post vital signs: Reviewed and stable  Last Vitals:  Vitals Value Taken Time  BP    Temp    Pulse 62 02/05/21 1108  Resp 19 02/05/21 1108  SpO2 93 % 02/05/21 1108  Vitals shown include unvalidated device data.  Last Pain:  Vitals:   02/05/21 1023  TempSrc: Temporal  PainSc: 0-No pain         Complications: No notable events documented.

## 2021-02-05 NOTE — H&P (Signed)
Charles Bellows, MD [address], [place], [place], [place], [address] [address], [place], [place], [place], [address] Phone: [phone]  Fax: [phone]  Primary Care Physician:  Maryland Pink, MD   Pre-Procedure History & Physical: HPI:  Charles Marks is a 66 y.o. male is here for an endoscopy    Past Medical History:  Diagnosis Date   Depression    Frequency of urination    since radiation tx   GERD (gastroesophageal reflux disease)    Headache    History of traumatic head injury    age 14--  head injury w/ fractured skull in coma for 3 days made full recovery withou further treatment--  no residual   Nocturia    Prostate cancer [facility]) urologist-  dr ottelin/  oncologist- dr [name]   T1c, Gleason 4+4,  PSA 14.24,  vol 25.6cc/  IM radiation therapy (05-23-2015 to 06-27-2015)     S/P radiation therapy 05-23-2015  to  06-27-2015   prostatic fossa  68.4 Gy in 38 fractions in 1.8 Gy   Subdural hematoma 07/29/2020   Type 2 diabetes, diet controlled ([place])    Ulcer    Wears glasses     Past Surgical History:  Procedure Laterality Date   ANTERIOR CERVICAL DECOMP/DISCECTOMY FUSION N/A 09/13/2020   Procedure: C5-6 ANTERIOR CERVICAL DECOMPRESSION/DISCECTOMY FUSION 1 LEVEL;  Surgeon: Meade Maw, MD;  Location: [facility] ORS;  Service: Neurosurgery;  Laterality: N/A;   APPENDECTOMY  01/21/1973   COLONOSCOPY  2012 approx   COLONOSCOPY WITH PROPOFOL N/A 04/29/2016   Procedure: COLONOSCOPY WITH PROPOFOL;  Surgeon: Manya Silvas, MD;  Location: [facility] ENDOSCOPY;  Service: Endoscopy;  Laterality: N/A;   CRANIOTOMY Right 07/29/2020   Procedure: CRANIOTOMY HEMATOMA EVACUATION SUBDURAL;  Surgeon: Meade Maw, MD;  Location: [facility] ORS;  Service: Neurosurgery;  Laterality: Right;   CYSTOSCOPY N/A 07/18/2015   Procedure: CYSTOSCOPY FLEXIBLE;  Surgeon: Kathie Rhodes, MD;  Location: [facility];  Service: Urology;  Laterality: N/A;  no seeds found in bladder    PROSTATE BIOPSY     RADIOACTIVE SEED IMPLANT N/A 07/18/2015   Procedure: RADIOACTIVE SEED IMPLANT/BRACHYTHERAPY IMPLANT;  Surgeon: Kathie Rhodes, MD;  Location: [place];  Service: Urology;  Laterality: N/A;     48  seeds implanted   SUBDURAL HEMATOMA EVACUATION VIA CRANIOTOMY Right 07/29/2020    Prior to Admission medications   Medication Sig Start Date End Date Taking? Authorizing Provider  aspirin 81 MG EC tablet Take 1 tablet by mouth daily.   Yes [provider]  atorvastatin (LIPITOR) 20 MG tablet Take 1 tablet by mouth at bedtime. 10/12/20 10/12/21 Yes [provider]  CALCIUM-VITAMIN D PO Take 1 tablet by mouth daily.   Yes [provider]  celecoxib (CELEBREX) 100 MG capsule Take 100 mg by mouth 2 (two) times daily. 09/21/20  Yes [provider]  Cholecalciferol (VITAMIN D3) 1000 units CAPS Take 1 capsule by mouth daily.    Yes [provider]  Erenumab-aooe (AIMOVIG, 140 MG DOSE, [place]) Inject 1 Dose into the skin every 30 (thirty) days.   Yes [provider]  gabapentin (NEURONTIN) 300 MG capsule Take 2 capsules by mouth 2 (two) times daily. 10/31/20  Yes [provider]  gabapentin (NEURONTIN) 400 MG capsule Take 2 capsules by mouth at bedtime. 12/20/20 12/20/21 Yes [provider]  levETIRAcetam (KEPPRA) 1000 MG tablet Take 1,000 mg by mouth 2 (two) times daily. 01/10/21  Yes [provider]  methocarbamol (ROBAXIN) 750 MG tablet Take 750 mg by mouth 3 (three) times daily. 12/08/20  Yes [provider]  methylPREDNISolone (MEDROL DOSEPAK) 4 MG TBPK tablet Take by mouth. 09/21/20  Yes [provider]  Multiple Vitamin (MULTI-VITAMINS) TABS Take 1 tablet by mouth daily.    Yes [provider]  mupirocin ointment (BACTROBAN) 2 % Apply 1 application topically daily. With dressing changes 01/08/21  Yes Ralene Bathe, MD  omeprazole (PRILOSEC) 20 MG capsule Take 20 mg by  mouth daily. 05/14/20  Yes [provider]  oxyCODONE-acetaminophen (PERCOCET) 7.5-325 MG tablet Take 1 tablet by mouth 4 (four) times daily as needed. 10/16/20  Yes [provider]  QUEtiapine (SEROQUEL XR) 200 MG 24 hr tablet Take 200 mg by mouth at bedtime. 01/06/15  Yes [provider]  sertraline (ZOLOFT) 100 MG tablet Take 100 mg by mouth in the morning and at bedtime.   Yes [provider]  SUMAtriptan Succinate Refill 6 MG/0.5ML SOCT Inject into the skin. 01/10/21 01/10/22 Yes [provider]  verapamil (CALAN) 120 MG tablet Take 120 mg by mouth daily. 07/10/20  Yes [provider]  zolpidem (AMBIEN) 5 MG tablet Take 5 mg by mouth at bedtime as needed. 09/05/20  Yes [provider]    Allergies as of 01/24/2021   (Not on File)    Family History  Problem Relation Age of Onset   Cancer Mother        brain tumor   Multiple sclerosis Mother    Lung cancer Father    Lung cancer Sister    Diabetes Sister     Social History   Socioeconomic History   Marital status: Married    Spouse name: Marcie Bal   Number of children: 0   Years of education: Not on file   Highest education level: Not on file  Occupational History   Not on file  Tobacco Use   Smoking status: Never   Smokeless tobacco: Never  Vaping Use   Vaping Use: Never used  Substance and Sexual Activity   Alcohol use: Not Currently    Comment: occasional beer/wine    Drug use: No   Sexual activity: Yes    Comment: Vasectomy  Other Topics Concern   Not on file  Social History Narrative   Not on file   Social Determinants of Health   Financial Resource Strain: Not on file  Food Insecurity: Not on file  Transportation Needs: Not on file  Physical Activity: Not on file  Stress: Not on file  Social Connections: Not on file  Intimate Partner Violence: Not on file    Review of Systems: See HPI, otherwise negative ROS  Physical Exam: BP (!) 122/59     Pulse 67    Temp (!) 96.8 F (36 C) (Temporal)    Resp 20    Ht 6\' 3"  (1.905 m)    Wt 90.7 kg    SpO2 97%    BMI 25.00 kg/m  General:   Alert,  pleasant and cooperative in NAD Head:  Normocephalic and atraumatic. Neck:  Supple; no masses or thyromegaly. Lungs:  Clear throughout to auscultation, normal respiratory effort.    Heart:  +S1, +S2, Regular rate and rhythm, No edema. Abdomen:  Soft, nontender and nondistended. Normal bowel sounds, without guarding, and without rebound.   Neurologic:  Alert and  oriented x4;  grossly normal neurologically.  Impression/Plan: Holman Bonsignore is here for an endoscopy  to be performed for  evaluation of abnormal ct scan of the esophagus    Risks, benefits, limitations, and alternatives regarding endoscopy have been reviewed with the patient.  Questions have been answered.  All parties agreeable.   Charles Bellows, MD  02/05/2021, 10:45 AM

## 2021-02-06 LAB — SURGICAL PATHOLOGY

## 2021-02-06 NOTE — Anesthesia Postprocedure Evaluation (Signed)
Anesthesia Post Note  Patient: Charles Marks  Procedure(s) Performed: ESOPHAGOGASTRODUODENOSCOPY (EGD) WITH PROPOFOL  Patient location during evaluation: Endoscopy Anesthesia Type: General Level of consciousness: awake and alert Pain management: pain level controlled Vital Signs Assessment: post-procedure vital signs reviewed and stable Respiratory status: spontaneous breathing, nonlabored ventilation, respiratory function stable and patient connected to nasal cannula oxygen Cardiovascular status: blood pressure returned to baseline and stable Postop Assessment: no apparent nausea or vomiting Anesthetic complications: no   No notable events documented.   Last Vitals:  Vitals:   02/05/21 1109 02/05/21 1119  BP: (!) 93/47 (!) 105/48  Pulse:    Resp:    Temp: (!) 36.3 C   SpO2:      Last Pain:  Vitals:   02/05/21 1129  TempSrc:   PainSc: 0-No pain                 Martha Clan

## 2021-02-07 ENCOUNTER — Encounter: Payer: Self-pay | Admitting: Gastroenterology

## 2022-01-29 ENCOUNTER — Other Ambulatory Visit: Payer: Self-pay

## 2022-01-29 ENCOUNTER — Ambulatory Visit
Admission: RE | Admit: 2022-01-29 | Discharge: 2022-01-29 | Disposition: A | Discharge status: Home / Self Care | Payer: Federal, State, Local not specified - PPO | Attending: Gastroenterology | Admitting: Gastroenterology

## 2022-01-29 ENCOUNTER — Ambulatory Visit: Payer: Federal, State, Local not specified - PPO | Admitting: Anesthesiology

## 2022-01-29 ENCOUNTER — Encounter
Admission: RE | Disposition: A | Discharge status: Home / Self Care | Payer: Self-pay | Source: Home / Self Care | Attending: Gastroenterology

## 2022-01-29 ENCOUNTER — Encounter: Payer: Self-pay | Admitting: Gastroenterology

## 2022-01-29 DIAGNOSIS — F32A Depression, unspecified: Secondary | ICD-10-CM | POA: Insufficient documentation

## 2022-01-29 DIAGNOSIS — K5520 Angiodysplasia of colon without hemorrhage: Secondary | ICD-10-CM | POA: Diagnosis not present

## 2022-01-29 DIAGNOSIS — K219 Gastro-esophageal reflux disease without esophagitis: Secondary | ICD-10-CM | POA: Diagnosis not present

## 2022-01-29 DIAGNOSIS — E119 Type 2 diabetes mellitus without complications: Secondary | ICD-10-CM | POA: Insufficient documentation

## 2022-01-29 DIAGNOSIS — R519 Headache, unspecified: Secondary | ICD-10-CM | POA: Insufficient documentation

## 2022-01-29 DIAGNOSIS — Z1211 Encounter for screening for malignant neoplasm of colon: Secondary | ICD-10-CM | POA: Insufficient documentation

## 2022-01-29 DIAGNOSIS — K627 Radiation proctitis: Secondary | ICD-10-CM | POA: Insufficient documentation

## 2022-01-29 DIAGNOSIS — Z923 Personal history of irradiation: Secondary | ICD-10-CM | POA: Insufficient documentation

## 2022-01-29 DIAGNOSIS — Z8546 Personal history of malignant neoplasm of prostate: Secondary | ICD-10-CM | POA: Insufficient documentation

## 2022-01-29 DIAGNOSIS — D122 Benign neoplasm of ascending colon: Secondary | ICD-10-CM | POA: Diagnosis not present

## 2022-01-29 DIAGNOSIS — Z86010 Personal history of colonic polyps: Secondary | ICD-10-CM | POA: Insufficient documentation

## 2022-01-29 DIAGNOSIS — Z87820 Personal history of traumatic brain injury: Secondary | ICD-10-CM | POA: Diagnosis not present

## 2022-01-29 DIAGNOSIS — K640 First degree hemorrhoids: Secondary | ICD-10-CM | POA: Diagnosis not present

## 2022-01-29 DIAGNOSIS — G40909 Epilepsy, unspecified, not intractable, without status epilepticus: Secondary | ICD-10-CM | POA: Insufficient documentation

## 2022-01-29 HISTORY — PX: COLONOSCOPY: SHX5424

## 2022-01-29 SURGERY — COLONOSCOPY
Anesthesia: General

## 2022-01-29 MED ORDER — LIDOCAINE HCL (CARDIAC) PF 100 MG/5ML IV SOSY
PREFILLED_SYRINGE | INTRAVENOUS | Status: DC | PRN
  Administered 2022-01-29: 40 mg via INTRAVENOUS

## 2022-01-29 MED ORDER — PROPOFOL 10 MG/ML IV BOLUS
INTRAVENOUS | Status: DC | PRN
  Administered 2022-01-29 (×6): 40 mg via INTRAVENOUS
  Administered 2022-01-29: 100 mg via INTRAVENOUS
  Administered 2022-01-29 (×2): 40 mg via INTRAVENOUS

## 2022-01-29 MED ORDER — SODIUM CHLORIDE 0.9 % IV SOLN: INTRAVENOUS | Status: DC

## 2022-01-29 MED ORDER — LIDOCAINE HCL (PF) 2 % IJ SOLN
INTRAMUSCULAR | Status: AC
  Filled 2022-01-29: qty 10

## 2022-01-29 MED ORDER — PROPOFOL 1000 MG/100ML IV EMUL
INTRAVENOUS | Status: AC
  Filled 2022-01-29: qty 100

## 2022-01-29 MED ORDER — PROPOFOL 10 MG/ML IV BOLUS
INTRAVENOUS | Status: AC
  Filled 2022-01-29: qty 40

## 2022-01-29 NOTE — Transfer of Care (Signed)
Immediate Anesthesia Transfer of Care Note  Patient: Charles Marks  Procedure(s) Performed: COLONOSCOPY  Patient Location: Endoscopy Unit  Anesthesia Type:General  Level of Consciousness: drowsy  Airway & Oxygen Therapy: Patient Spontanous Breathing and Patient connected to nasal cannula oxygen  Post-op Assessment: Report given to RN, Post -op Vital signs reviewed and stable, and Patient moving all extremities  Post vital signs: Reviewed and stable  Last Vitals:  Vitals Value Taken Time  BP 105/53 01/29/22 1248  Temp    Pulse 61 01/29/22 1249  Resp 13 01/29/22 1249  SpO2 99 % 01/29/22 1249  Vitals shown include unvalidated device data.  Last Pain:  Vitals:   01/29/22 1146  TempSrc: Temporal  PainSc: 0-No pain         Complications: No notable events documented.

## 2022-01-29 NOTE — Anesthesia Preprocedure Evaluation (Signed)
Anesthesia Evaluation  Patient identified by MRN, date of birth, ID band Patient awake    Reviewed: Allergy & Precautions, NPO status , Patient's Chart, lab work & pertinent test results  History of Anesthesia Complications Negative for: history of anesthetic complications  Airway Mallampati: III  TM Distance: <3 FB Neck ROM: full    Dental  (+) Chipped   Pulmonary neg pulmonary ROS, neg shortness of breath   Pulmonary exam normal        Cardiovascular Exercise Tolerance: Good + angina  (-) Past MI Normal cardiovascular exam     Neuro/Psych  Headaches, Seizures -,    Depression     negative psych ROS   GI/Hepatic Neg liver ROS,GERD  Controlled,,  Endo/Other  negative endocrine ROSdiabetes, Type 2    Renal/GU negative Renal ROS  negative genitourinary   Musculoskeletal   Abdominal   Peds  Hematology negative hematology ROS (+)   Anesthesia Other Findings Past Medical History: No date: Depression No date: Frequency of urination     Comment:  since radiation tx No date: GERD (gastroesophageal reflux disease) No date: Headache No date: History of traumatic head injury     Comment:  age 67--  head injury w/ fractured skull in coma for 3               days made full recovery withou further treatment--  no               residual No date: Nocturia urologist-  dr ottelin/  oncologist- dr [name]: Prostate cancer [facility])     Comment:  T1c, Gleason 4+4,  PSA 14.24,  vol 25.6cc/  IM radiation              therapy (05-23-2015 to 06-27-2015)   05-23-2015  to  06-27-2015: S/P radiation therapy     Comment:  prostatic fossa  68.4 Gy in 38 fractions in 1.8 Gy 07/29/2020: Subdural hematoma ([facility]) No date: Type 2 diabetes, diet controlled ([place]) No date: Ulcer No date: Wears glasses  Past Surgical History: 09/13/2020: ANTERIOR CERVICAL DECOMP/DISCECTOMY FUSION; N/A     Comment:  Procedure: C5-6 ANTERIOR CERVICAL                DECOMPRESSION/DISCECTOMY FUSION 1 LEVEL;  Surgeon:               Meade Maw, MD;  Location: [facility] ORS;  Service:               Neurosurgery;  Laterality: N/A; 01/21/1973: APPENDECTOMY 2012 approx: COLONOSCOPY 04/29/2016: COLONOSCOPY WITH PROPOFOL; N/A     Comment:  Procedure: COLONOSCOPY WITH PROPOFOL;  Surgeon: Manya Silvas, MD;  Location: [facility] ENDOSCOPY;  Service:               Endoscopy;  Laterality: N/A; 07/29/2020: CRANIOTOMY; Right     Comment:  Procedure: CRANIOTOMY HEMATOMA EVACUATION SUBDURAL;                Surgeon: Meade Maw, MD;  Location: [facility] ORS;                Service: Neurosurgery;  Laterality: Right; 07/18/2015: CYSTOSCOPY; N/A     Comment:  Procedure: CYSTOSCOPY FLEXIBLE;  Surgeon: Kathie Rhodes,               MD;  Location: [place];  Service:  Urology;  Laterality: N/A;  no seeds found in bladder 02/05/2021: ESOPHAGOGASTRODUODENOSCOPY (EGD) WITH PROPOFOL; N/A     Comment:  Procedure: ESOPHAGOGASTRODUODENOSCOPY (EGD) WITH               PROPOFOL;  Surgeon: Jonathon Bellows, MD;  Location: [facility]               ENDOSCOPY;  Service: Gastroenterology;  Laterality: N/A; No date: PROSTATE BIOPSY 07/18/2015: RADIOACTIVE SEED IMPLANT; N/A     Comment:  Procedure: RADIOACTIVE SEED IMPLANT/BRACHYTHERAPY               IMPLANT;  Surgeon: Kathie Rhodes, MD;  Location: [place];  Service: Urology;  Laterality: N/A;              4  seeds implanted 07/29/2020: SUBDURAL HEMATOMA EVACUATION VIA CRANIOTOMY; Right  BMI    Body Mass Index: 23.25 kg/m      Reproductive/Obstetrics negative OB ROS                             Anesthesia Physical Anesthesia Plan  ASA: 3  Anesthesia Plan: General   Post-op Pain Management:    Induction: Intravenous  PONV Risk Score and Plan: Propofol infusion and TIVA  Airway Management Planned: Natural Airway and Nasal  Cannula  Additional Equipment:   Intra-op Plan:   Post-operative Plan:   Informed Consent: I have reviewed the patients History and Physical, chart, labs and discussed the procedure including the risks, benefits and alternatives for the proposed anesthesia with the patient or authorized representative who has indicated his/her understanding and acceptance.     Dental Advisory Given  Plan Discussed with: Anesthesiologist, CRNA and Surgeon  Anesthesia Plan Comments: (Patient consented for risks of anesthesia including but not limited to:  - adverse reactions to medications - risk of airway placement if required - damage to eyes, teeth, lips or other oral mucosa - nerve damage due to positioning  - sore throat or hoarseness - Damage to heart, brain, nerves, lungs, other parts of body or loss of life  Patient voiced understanding.)       Anesthesia Quick Evaluation

## 2022-01-29 NOTE — H&P (Signed)
Outpatient short stay form Pre-procedure 01/29/2022  Lesly Rubenstein, MD  Primary Physician: Maryland Pink, MD  Reason for visit:  Surveillance colonoscopy  History of present illness:    67 y/o gentleman with history of TBI, GERD, and prostate cancer here for surveillance colonoscopy. Last colonoscopy in 2018 was unremarkable. No blood thinners. No family history of GI malignancies. No significant abdominal surgeries.    Current Facility-Administered Medications:    0.9 %  sodium chloride infusion, , Intravenous, Continuous, Emery Binz, Hilton Cork, MD, Last Rate: 20 mL/hr at 01/29/22 1155, New Bag at 01/29/22 1155  Medications Prior to Admission  Medication Sig Dispense Refill Last Dose   aspirin 81 MG EC tablet Take 1 tablet by mouth daily.   Past Week   CALCIUM-VITAMIN D PO Take 1 tablet by mouth daily.   Past Week   celecoxib (CELEBREX) 100 MG capsule Take 100 mg by mouth 2 (two) times daily.   01/28/2022   Cholecalciferol (VITAMIN D3) 1000 units CAPS Take 1 capsule by mouth daily.    Past Week   Erenumab-aooe (AIMOVIG, 140 MG DOSE, [place]) Inject 1 Dose into the skin every 30 (thirty) days.   01/28/2022   gabapentin (NEURONTIN) 300 MG capsule Take 2 capsules by mouth 2 (two) times daily.   01/28/2022   levETIRAcetam (KEPPRA) 1000 MG tablet Take 1,000 mg by mouth 2 (two) times daily.   01/28/2022   methocarbamol (ROBAXIN) 750 MG tablet Take 750 mg by mouth 3 (three) times daily.   01/28/2022   methylPREDNISolone (MEDROL DOSEPAK) 4 MG TBPK tablet Take by mouth.   01/28/2022   Multiple Vitamin (MULTI-VITAMINS) TABS Take 1 tablet by mouth daily.    Past Week   mupirocin ointment (BACTROBAN) 2 % Apply 1 application topically daily. With dressing changes 22 g 0 Past Week   omeprazole (PRILOSEC) 20 MG capsule Take 20 mg by mouth daily.   01/28/2022   oxyCODONE-acetaminophen (PERCOCET) 7.5-325 MG tablet Take 1 tablet by mouth 4 (four) times daily as needed.   01/28/2022   QUEtiapine (SEROQUEL XR) 200 MG 24  hr tablet Take 200 mg by mouth at bedtime.   01/28/2022   sertraline (ZOLOFT) 100 MG tablet Take 100 mg by mouth in the morning and at bedtime.   01/28/2022   verapamil (CALAN) 120 MG tablet Take 120 mg by mouth daily.   01/28/2022   zolpidem (AMBIEN) 5 MG tablet Take 5 mg by mouth at bedtime as needed.   01/28/2022     No Known Allergies   Past Medical History:  Diagnosis Date   Depression    Frequency of urination    since radiation tx   GERD (gastroesophageal reflux disease)    Headache    History of traumatic head injury    age 85--  head injury w/ fractured skull in coma for 3 days made full recovery withou further treatment--  no residual   Nocturia    Prostate cancer [facility]) urologist-  dr ottelin/  oncologist- dr [name]   T1c, Gleason 4+4,  PSA 14.24,  vol 25.6cc/  IM radiation therapy (05-23-2015 to 06-27-2015)     S/P radiation therapy 05-23-2015  to  06-27-2015   prostatic fossa  68.4 Gy in 38 fractions in 1.8 Gy   Subdural hematoma ([place]) 07/29/2020   Type 2 diabetes, diet controlled ([place])    Ulcer    Wears glasses     Review of systems:  Otherwise negative.    Physical Exam  Gen: Alert,  oriented. Appears stated age.  HEENT: PERRLA. Lungs: No respiratory distress CV: RRR Abd: soft, benign, no masses Ext: No edema    Planned procedures: Proceed with colonoscopy. The patient understands the nature of the planned procedure, indications, risks, alternatives and potential complications including but not limited to bleeding, infection, perforation, damage to internal organs and possible oversedation/side effects from anesthesia. The patient agrees and gives consent to proceed.  Please refer to procedure notes for findings, recommendations and patient disposition/instructions.     Lesly Rubenstein, MD [facility] Gastroenterology

## 2022-01-29 NOTE — Interval H&P Note (Signed)
History and Physical Interval Note:  01/29/2022 12:03 PM  Charles Marks  has presented today for surgery, with the diagnosis of History of colon polyps (Z86.010).  The various methods of treatment have been discussed with the patient and family. After consideration of risks, benefits and other options for treatment, the patient has consented to  Procedure(s): COLONOSCOPY (N/A) as a surgical intervention.  The patient's history has been reviewed, patient examined, no change in status, stable for surgery.  I have reviewed the patient's chart and labs.  Questions were answered to the patient's satisfaction.     Lesly Rubenstein  Ok to proceed with colonoscopy

## 2022-01-29 NOTE — Op Note (Signed)
[facility] Gastroenterology Patient Name: Charles Marks Procedure Date: 01/29/2022 11:27 AM MRN: [id number] Account #: [id number] Date of Birth: 09-Mar-1955 Admit Type: Outpatient Age: 67 Room: [facility] ENDO ROOM 1 Gender: Male Note Status: Finalized Instrument Name: Jasper Riling [id number] Procedure:             Colonoscopy Indications:           Surveillance: Personal history of colonic polyps                         (unknown histology) on last colonoscopy 5 years ago Providers:             Andrey Farmer MD, MD Referring MD:          Andrey Farmer MD, MD (Referring MD) Medicines:             Monitored Anesthesia Care Complications:         No immediate complications. Estimated blood loss:                         Minimal. Procedure:             Pre-Anesthesia Assessment:                        - Prior to the procedure, a History and Physical was                         performed, and patient medications and allergies were                         reviewed. The patient is competent. The risks and                         benefits of the procedure and the sedation options and                         risks were discussed with the patient. All questions                         were answered and informed consent was obtained.                         Patient identification and proposed procedure were                         verified by the physician, the nurse, the                         anesthesiologist, the anesthetist and the technician                         in the endoscopy suite. Mental Status Examination:                         alert and oriented. Airway Examination: normal                         oropharyngeal airway and neck mobility. Respiratory  Examination: clear to auscultation. CV Examination:                         normal. Prophylactic Antibiotics: The patient does not                         require prophylactic antibiotics. Prior                          Anticoagulants: The patient has taken no anticoagulant                         or antiplatelet agents. ASA Grade Assessment: III - A                         patient with severe systemic disease. After reviewing                         the risks and benefits, the patient was deemed in                         satisfactory condition to undergo the procedure. The                         anesthesia plan was to use monitored anesthesia care                         (MAC). Immediately prior to administration of                         medications, the patient was re-assessed for adequacy                         to receive sedatives. The heart rate, respiratory                         rate, oxygen saturations, blood pressure, adequacy of                         pulmonary ventilation, and response to care were                         monitored throughout the procedure. The physical                         status of the patient was re-assessed after the                         procedure.                        After obtaining informed consent, the colonoscope was                         passed under direct vision. Throughout the procedure,                         the patient's blood pressure, pulse, and oxygen  saturations were monitored continuously. The                         Colonoscope was introduced through the anus and                         advanced to the the cecum, identified by appendiceal                         orifice and ileocecal valve. The colonoscopy was                         somewhat difficult due to a redundant colon.                         Successful completion of the procedure was aided by                         applying abdominal pressure. The patient tolerated the                         procedure well. The quality of the bowel preparation                         was adequate to identify polyps. The ileocecal valve,                          appendiceal orifice, and rectum were photographed. Findings:      The perianal and digital rectal examinations were normal.      Two sessile polyps were found in the ascending colon. The polyps were 2       mm in size. These polyps were removed with a jumbo cold forceps.       Resection and retrieval were complete. Estimated blood loss was minimal.      A 5 mm polyp was found in the ascending colon. The polyp was sessile.       The polyp was removed with a cold snare. Resection and retrieval were       complete. Estimated blood loss was minimal.      Internal hemorrhoids were found during retroflexion. The hemorrhoids       were Grade I (internal hemorrhoids that do not prolapse).      Multiple small patchy angioectasias without bleeding were found in the       rectum. 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      The exam was otherwise without abnormality on direct and retroflexion       views. Impression:            - Two 2 mm polyps in the ascending colon, removed with                         a jumbo cold forceps. Resected and retrieved.                        - One 5 mm polyp in the ascending colon, removed with                         a cold snare. Resected and retrieved.                        -  Internal hemorrhoids.                        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                        - The examination was otherwise normal on direct and                         retroflexion views. Recommendation:        - Discharge patient to home.                        - Resume previous diet.                        - Continue present medications.                        - Await pathology results.                        - Repeat colonoscopy in 3 years for surveillance.                        - Return to referring physician as previously                         scheduled. Procedure Code(s):      --- Professional ---                        [phone], Colonoscopy, flexible; with removal of                         tumor(s), polyp(s), or other lesion(s) by snare                         technique                        45380, 82, Colonoscopy, flexible; with biopsy, single                         or multiple Diagnosis Code(s):     --- Professional ---                        D12.2, Benign neoplasm of ascending colon                        Z86.010, Personal history of colonic polyps                        K55.20, Angiodysplasia of colon without hemorrhage                        K64.0, First degree hemorrhoids CPT copyright 2022 American Medical Association. All rights reserved. The codes documented in this report are preliminary and upon coder review may  be revised to meet current compliance  requirements. Andrey Farmer MD, MD 01/29/2022 12:52:11 PM Number of Addenda: 0 Note Initiated On: 01/29/2022 11:27 AM Scope Withdrawal Time: 0 hours 15 minutes 35 seconds  Total Procedure Duration: 0 hours 27 minutes 7 seconds  Estimated Blood Loss:  Estimated blood loss was minimal.      [facility]

## 2022-01-30 ENCOUNTER — Encounter: Payer: Self-pay | Admitting: Gastroenterology

## 2022-01-30 LAB — SURGICAL PATHOLOGY

## 2022-01-30 NOTE — Anesthesia Postprocedure Evaluation (Signed)
Anesthesia Post Note  Patient: Charles Marks  Procedure(s) Performed: COLONOSCOPY  Patient location during evaluation: Endoscopy Anesthesia Type: General Level of consciousness: awake and alert Pain management: pain level controlled Vital Signs Assessment: post-procedure vital signs reviewed and stable Respiratory status: spontaneous breathing, nonlabored ventilation, respiratory function stable and patient connected to nasal cannula oxygen Cardiovascular status: blood pressure returned to baseline and stable Postop Assessment: no apparent nausea or vomiting Anesthetic complications: no   No notable events documented.   Last Vitals:  Vitals:   01/29/22 1258 01/29/22 1308  BP: 116/67 123/70  Pulse:    Resp:    Temp:    SpO2:      Last Pain:  Vitals:   01/29/22 1308  TempSrc:   PainSc: 0-No pain                 Precious Haws Yajayra Feldt

## 2022-03-21 ENCOUNTER — Ambulatory Visit: Payer: Federal, State, Local not specified - PPO | Admitting: Dermatology

## 2022-03-21 VITALS — BP 135/80 | HR 66

## 2022-03-21 DIAGNOSIS — Z7189 Other specified counseling: Secondary | ICD-10-CM | POA: Diagnosis not present

## 2022-03-21 DIAGNOSIS — M67441 Ganglion, right hand: Secondary | ICD-10-CM

## 2022-03-21 DIAGNOSIS — M67449 Ganglion, unspecified hand: Secondary | ICD-10-CM

## 2022-03-21 DIAGNOSIS — L820 Inflamed seborrheic keratosis: Secondary | ICD-10-CM

## 2022-03-21 NOTE — Patient Instructions (Addendum)
Seborrheic Keratosis  What causes seborrheic keratoses? Seborrheic keratoses are harmless, common skin growths that first appear during adult life.  As time goes by, more growths appear.  Some people may develop a large number of them.  Seborrheic keratoses appear on both covered and uncovered body parts.  They are not caused by sunlight.  The tendency to develop seborrheic keratoses can be inherited.  They vary in color from skin-colored to gray, brown, or even black.  They can be either smooth or have a rough, warty surface.   Seborrheic keratoses are superficial and look as if they were stuck on the skin.  Under the microscope this type of keratosis looks like layers upon layers of skin.  That is why at times the top layer may seem to fall off, but the rest of the growth remains and re-grows.    Treatment Seborrheic keratoses do not need to be treated, but can easily be removed in the office.  Seborrheic keratoses often cause symptoms when they rub on clothing or jewelry.  Lesions can be in the way of shaving.  If they become inflamed, they can cause itching, soreness, or burning.  Removal of a seborrheic keratosis can be accomplished by freezing, burning, or surgery. If any spot bleeds, scabs, or grows rapidly, please return to have it checked, as these can be an indication of a skin cancer.  Cryotherapy Aftercare  Wash gently with soap and water everyday.   Apply Vaseline and Band-Aid daily until healed.    Due to recent changes in healthcare laws, you may see results of your pathology and/or laboratory studies on MyChart before the doctors have had a chance to review them. We understand that in some cases there may be results that are confusing or concerning to you. Please understand that not all results are received at the same time and often the doctors may need to interpret multiple results in order to provide you with the best plan of care or course of treatment. Therefore, we ask that you  please give us 2 business days to thoroughly review all your results before contacting the office for clarification. Should we see a critical lab result, you will be contacted sooner.   If You Need Anything After Your Visit  If you have any questions or concerns for your doctor, please call our main line at [phone] and press option 4 to reach your doctor's medical assistant. If no one answers, please leave a voicemail as directed and we will return your call as soon as possible. Messages left after 4 pm will be answered the following business day.   You may also send us a message via MyChart. We typically respond to MyChart messages within 1-2 business days.  For prescription refills, please ask your pharmacy to contact our office. Our fax number is [phone].  If you have an urgent issue when the clinic is closed that cannot wait until the next business day, you can page your doctor at the number below.    Please note that while we do our best to be available for urgent issues outside of office hours, we are not available 24/7.   If you have an urgent issue and are unable to reach us, you may choose to seek medical care at your doctor's office, retail clinic, urgent care center, or emergency room.  If you have a medical emergency, please immediately call 911 or go to the emergency department.  Pager Numbers  - Dr. Kowalski: [phone]  -   Dr. Moye: [phone]  - Dr. Stewart: [phone]  In the event of inclement weather, please call our main line at [phone] for an update on the status of any delays or closures.  Dermatology Medication Tips: Please keep the boxes that topical medications come in in order to help keep track of the instructions about where and how to use these. Pharmacies typically print the medication instructions only on the boxes and not directly on the medication tubes.   If your medication is too expensive, please contact our office at  [phone] option 4 or send us a message through MyChart.   We are unable to tell what your co-pay for medications will be in advance as this is different depending on your insurance coverage. However, we may be able to find a substitute medication at lower cost or fill out paperwork to get insurance to cover a needed medication.   If a prior authorization is required to get your medication covered by your insurance company, please allow us 1-2 business days to complete this process.  Drug prices often vary depending on where the prescription is filled and some pharmacies may offer cheaper prices.  The website www.goodrx.com contains coupons for medications through different pharmacies. The prices here do not account for what the cost may be with help from insurance (it may be cheaper with your insurance), but the website can give you the price if you did not use any insurance.  - You can print the associated coupon and take it with your prescription to the pharmacy.  - You may also stop by our office during regular business hours and pick up a GoodRx coupon card.  - If you need your prescription sent electronically to a different pharmacy, notify our office through [facility] MyChart or by phone at [phone] option 4.     Si Usted Necesita Algo Despus de Su Visita  Tambin puede enviarnos un mensaje a travs de MyChart. Por lo general respondemos a los mensajes de MyChart en el transcurso de 1 a 2 das hbiles.  Para renovar recetas, por favor pida a su farmacia que se ponga en contacto con nuestra oficina. Nuestro nmero de fax es el [phone].  Si tiene un asunto urgente cuando la clnica est cerrada y que no puede esperar hasta el siguiente da hbil, puede llamar/localizar a su doctor(a) al nmero que aparece a continuacin.   Por favor, tenga en cuenta que aunque hacemos todo lo posible para estar disponibles para asuntos urgentes fuera del horario de oficina, no estamos  disponibles las 24 horas del da, los 7 das de la semana.   Si tiene un problema urgente y no puede comunicarse con nosotros, puede optar por buscar atencin mdica  en el consultorio de su doctor(a), en una clnica privada, en un centro de atencin urgente o en una sala de emergencias.  Si tiene una emergencia mdica, por favor llame inmediatamente al 911 o vaya a la sala de emergencias.  Nmeros de bper  - Dr. Kowalski: [phone]  - Dra. Moye: [phone]  - Dra. Stewart: [phone]  En caso de inclemencias del tiempo, por favor llame a nuestra lnea principal al [phone] para una actualizacin sobre el estado de cualquier retraso o cierre.  Consejos para la medicacin en dermatologa: Por favor, guarde las cajas en las que vienen los medicamentos de uso tpico para ayudarle a seguir las instrucciones sobre dnde y cmo usarlos. Las farmacias generalmente imprimen las instrucciones del medicamento slo en las cajas y   no directamente en los tubos del medicamento.   Si su medicamento es muy caro, por favor, pngase en contacto con nuestra oficina llamando al [phone] y presione la opcin 4 o envenos un mensaje a travs de MyChart.   No podemos decirle cul ser su copago por los medicamentos por adelantado ya que esto es diferente dependiendo de la cobertura de su seguro. Sin embargo, es posible que podamos encontrar un medicamento sustituto a menor costo o llenar un formulario para que el seguro cubra el medicamento que se considera necesario.   Si se requiere una autorizacin previa para que su compaa de seguros cubra su medicamento, por favor permtanos de 1 a 2 das hbiles para completar este proceso.  Los precios de los medicamentos varan con frecuencia dependiendo del lugar de dnde se surte la receta y alguna farmacias pueden ofrecer precios ms baratos.  El sitio web www.goodrx.com tiene cupones para medicamentos de diferentes farmacias. Los precios aqu no  tienen en cuenta lo que podra costar con la ayuda del seguro (puede ser ms barato con su seguro), pero el sitio web puede darle el precio si no utiliz ningn seguro.  - Puede imprimir el cupn correspondiente y llevarlo con su receta a la farmacia.  - Tambin puede pasar por nuestra oficina durante el horario de atencin regular y recoger una tarjeta de cupones de GoodRx.  - Si necesita que su receta se enve electrnicamente a una farmacia diferente, informe a nuestra oficina a travs de MyChart de [facility] o por telfono llamando al [phone] y presione la opcin 4.  

## 2022-03-21 NOTE — Progress Notes (Signed)
   Follow-Up Visit   Subjective  Charles Marks is a 67 y.o. male who presents for the following: check spots (Chest, has had for yrs, change in color past 2 weeks/Growth/knot, R base of thumb, 71m no symptoms). The patient has spots, moles and lesions to be evaluated, some may be new or changing and the patient has concerns that these could be cancer.  The following portions of the chart were reviewed this encounter and updated as appropriate:   Tobacco  Allergies  Meds  Problems  Med Hx  Surg Hx  Fam Hx     Review of Systems:  No other skin or systemic complaints except as noted in HPI or Assessment and Plan.  Objective  Well appearing patient in no apparent distress; mood and affect are within normal limits.  A focused examination was performed including chest, right hand. Relevant physical exam findings are noted in the Assessment and Plan.  sternum x 1 Stuck on waxy paps with erythema  R base of thumb 0.6cm firm pap   Assessment & Plan  Inflamed seborrheic keratosis sternum x 1 Symptomatic, irritating, patient would like treated. Destruction of lesion - sternum x 1 Complexity: simple   Destruction method: cryotherapy   Informed consent: discussed and consent obtained   Timeout:  patient name, date of birth, surgical site, and procedure verified Lesion destroyed using liquid nitrogen: Yes   Region frozen until ice ball extended beyond lesion: Yes   Outcome: patient tolerated procedure well with no complications   Post-procedure details: wound care instructions given    Digital mucous cyst R base of thumb Vs changes associated with Dupuytren's contracture Benign appearing, observe Discussed referral to hand surgeon, pt declined  Return in about 3 months (around 06/19/2022) for ISK f/u.  I, SOthelia Pulling RMA, am acting as scribe for DSarina Ser MD . Documentation: I have reviewed the above documentation for accuracy and completeness, and I agree with the  above.  DSarina Ser MD

## 2022-03-29 ENCOUNTER — Encounter: Payer: Self-pay | Admitting: Dermatology

## 2022-06-19 ENCOUNTER — Ambulatory Visit: Payer: Federal, State, Local not specified - PPO | Admitting: Dermatology

## 2022-07-04 ENCOUNTER — Ambulatory Visit: Payer: Federal, State, Local not specified - PPO | Admitting: Dermatology

## 2022-07-04 VITALS — BP 114/72

## 2022-07-04 DIAGNOSIS — X32XXXA Exposure to sunlight, initial encounter: Secondary | ICD-10-CM

## 2022-07-04 DIAGNOSIS — W908XXA Exposure to other nonionizing radiation, initial encounter: Secondary | ICD-10-CM

## 2022-07-04 DIAGNOSIS — L814 Other melanin hyperpigmentation: Secondary | ICD-10-CM | POA: Diagnosis not present

## 2022-07-04 DIAGNOSIS — L821 Other seborrheic keratosis: Secondary | ICD-10-CM

## 2022-07-04 DIAGNOSIS — D229 Melanocytic nevi, unspecified: Secondary | ICD-10-CM

## 2022-07-04 DIAGNOSIS — L820 Inflamed seborrheic keratosis: Secondary | ICD-10-CM | POA: Diagnosis not present

## 2022-07-04 DIAGNOSIS — L578 Other skin changes due to chronic exposure to nonionizing radiation: Secondary | ICD-10-CM

## 2022-07-04 NOTE — Patient Instructions (Signed)
Due to recent changes in healthcare laws, you may see results of your pathology and/or laboratory studies on MyChart before the doctors have had a chance to review them. We understand that in some cases there may be results that are confusing or concerning to you. Please understand that not all results are received at the same time and often the doctors may need to interpret multiple results in order to provide you with the best plan of care or course of treatment. Therefore, we ask that you please give us 2 business days to thoroughly review all your results before contacting the office for clarification. Should we see a critical lab result, you will be contacted sooner.   If You Need Anything After Your Visit  If you have any questions or concerns for your doctor, please call our main line at [phone] and press option 4 to reach your doctor's medical assistant. If no one answers, please leave a voicemail as directed and we will return your call as soon as possible. Messages left after 4 pm will be answered the following business day.   You may also send us a message via MyChart. We typically respond to MyChart messages within 1-2 business days.  For prescription refills, please ask your pharmacy to contact our office. Our fax number is [phone].  If you have an urgent issue when the clinic is closed that cannot wait until the next business day, you can page your doctor at the number below.    Please note that while we do our best to be available for urgent issues outside of office hours, we are not available 24/7.   If you have an urgent issue and are unable to reach us, you may choose to seek medical care at your doctor's office, retail clinic, urgent care center, or emergency room.  If you have a medical emergency, please immediately call 911 or go to the emergency department.  Pager Numbers  - Dr. Kowalski: [phone]  - Dr. Moye: [phone]  - Dr. Stewart:  [phone]  In the event of inclement weather, please call our main line at [phone] for an update on the status of any delays or closures.  Dermatology Medication Tips: Please keep the boxes that topical medications come in in order to help keep track of the instructions about where and how to use these. Pharmacies typically print the medication instructions only on the boxes and not directly on the medication tubes.   If your medication is too expensive, please contact our office at [phone] option 4 or send us a message through MyChart.   We are unable to tell what your co-pay for medications will be in advance as this is different depending on your insurance coverage. However, we may be able to find a substitute medication at lower cost or fill out paperwork to get insurance to cover a needed medication.   If a prior authorization is required to get your medication covered by your insurance company, please allow us 1-2 business days to complete this process.  Drug prices often vary depending on where the prescription is filled and some pharmacies may offer cheaper prices.  The website www.goodrx.com contains coupons for medications through different pharmacies. The prices here do not account for what the cost may be with help from insurance (it may be cheaper with your insurance), but the website can give you the price if you did not use any insurance.  - You can print the associated coupon and take it with   your prescription to the pharmacy.  - You may also stop by our office during regular business hours and pick up a GoodRx coupon card.  - If you need your prescription sent electronically to a different pharmacy, notify our office through [facility] MyChart or by phone at [phone] option 4.     

## 2022-07-04 NOTE — Progress Notes (Signed)
   Follow-Up Visit   Subjective  Charles Marks is a 67 y.o. male who presents for the following: ISK follow up at chest. Patient advises area has improved, may still be some residual.  The patient has spots, moles and lesions to be evaluated, some may be new or changing and the patient may have concern these could be cancer.  The following portions of the chart were reviewed this encounter and updated as appropriate: medications, allergies, medical history  Review of Systems:  No other skin or systemic complaints except as noted in HPI or Assessment and Plan.  Objective  Well appearing patient in no apparent distress; mood and affect are within normal limits.  A focused examination was performed of the following areas: Face, chest, back  Relevant exam findings are noted in the Assessment and Plan.  sternum Erythematous stuck-on, waxy papule or plaque   Assessment & Plan   SEBORRHEIC KERATOSIS - Stuck-on, waxy, tan-brown papules and/or plaques  - Benign-appearing - Discussed benign etiology and prognosis. - Observe - Call for any changes   Inflamed seborrheic keratosis sternum  Symptomatic, irritating, patient would like treated.  Benign-appearing.  Call clinic for new or changing lesions.   Prior to procedure, discussed risks of blister formation, small wound, skin dyspigmentation, or rare scar following treatment. Recommend Vaseline ointment to treated areas while healing.   Destruction of lesion - sternum Complexity: simple   Destruction method: cryotherapy   Informed consent: discussed and consent obtained   Timeout:  patient name, date of birth, surgical site, and procedure verified Lesion destroyed using liquid nitrogen: Yes   Region frozen until ice ball extended beyond lesion: Yes   Outcome: patient tolerated procedure well with no complications   Post-procedure details: wound care instructions given    ACTINIC DAMAGE - chronic, secondary to cumulative UV  radiation exposure/sun exposure over time - diffuse scaly erythematous macules with underlying dyspigmentation - Recommend daily broad spectrum sunscreen SPF 30+ to sun-exposed areas, reapply every 2 hours as needed.  - Recommend staying in the shade or wearing long sleeves, sun glasses (UVA+UVB protection) and wide brim hats (4-inch brim around the entire circumference of the hat). - Call for new or changing lesions.  MELANOCYTIC NEVI Exam: Tan-brown and/or pink-flesh-colored symmetric macules and papules  Treatment Plan: Benign appearing on exam today. Recommend observation. Call clinic for new or changing moles. Recommend daily use of broad spectrum spf 30+ sunscreen to sun-exposed areas.   LENTIGINES Exam: scattered tan macules Due to sun exposure Treatment Plan: Benign-appearing, observe. Recommend daily broad spectrum sunscreen SPF 30+ to sun-exposed areas, reapply every 2 hours as needed.  Call for any changes  Return if symptoms worsen or fail to improve.  Anise Salvo, RMA, am acting as scribe for Armida Sans, MD .  Documentation: I have reviewed the above documentation for accuracy and completeness, and I agree with the above.  Armida Sans, MD

## 2022-07-06 ENCOUNTER — Encounter: Payer: Self-pay | Admitting: Dermatology

## 2022-09-03 ENCOUNTER — Other Ambulatory Visit: Payer: Self-pay | Admitting: Family Medicine

## 2022-09-03 DIAGNOSIS — M5412 Radiculopathy, cervical region: Secondary | ICD-10-CM

## 2022-09-24 ENCOUNTER — Other Ambulatory Visit: Payer: Federal, State, Local not specified - PPO

## 2022-09-24 ENCOUNTER — Ambulatory Visit
Admission: RE | Admit: 2022-09-24 | Discharge: 2022-09-24 | Disposition: A | Discharge status: Home / Self Care | Payer: Federal, State, Local not specified - PPO | Source: Ambulatory Visit | Attending: Family Medicine | Admitting: Family Medicine

## 2022-09-24 DIAGNOSIS — M5412 Radiculopathy, cervical region: Secondary | ICD-10-CM

## 2023-07-02 ENCOUNTER — Other Ambulatory Visit: Payer: Self-pay | Admitting: Family Medicine

## 2023-07-02 ENCOUNTER — Ambulatory Visit
Admission: RE | Admit: 2023-07-02 | Discharge: 2023-07-02 | Disposition: A | Discharge status: Home / Self Care | Source: Ambulatory Visit | Attending: Family Medicine | Admitting: Family Medicine

## 2023-07-02 DIAGNOSIS — M5416 Radiculopathy, lumbar region: Secondary | ICD-10-CM | POA: Insufficient documentation

## 2023-07-07 ENCOUNTER — Inpatient Hospital Stay
Admission: RE | Admit: 2023-07-07 | Discharge: 2023-07-07 | Disposition: A | Discharge status: Home / Self Care | Payer: Self-pay | Source: Ambulatory Visit | Attending: Neurosurgery | Admitting: Neurosurgery

## 2023-07-07 ENCOUNTER — Other Ambulatory Visit: Payer: Self-pay | Admitting: Family Medicine

## 2023-07-07 DIAGNOSIS — Z049 Encounter for examination and observation for unspecified reason: Secondary | ICD-10-CM

## 2023-07-08 NOTE — Progress Notes (Unsigned)
 Referring Physician:  Brant Caldron, FNP [address] [place],  [place] [address]  Primary Physician:  Lyle San, MD  History of Present Illness: 07/10/2023 [name] is here today with a chief complaint of persistent back pain and leg weakness. He was referred for evaluation of his back pain and leg weakness.  Back pain began a month ago during a trip to the [place] after lifting a bag and worsened after feeling a 'pop' while standing from a recliner. Pain persists despite an injection from physiatry, especially with movement, standing, or sitting.  The pain is localized to the back without radiation to the buttock. There is numbness and instability in the right leg, and significant pain occurs when standing in one position. The left leg feels weak, described as 'flopping' like a 'wet noodle' when walking.  Pain is severe in the morning, making it difficult to get out of bed, and is only absent when not moving. He has not initiated any exercises for his condition. Sleep is disrupted due to pain, as movement during sleep causes awakening.  Bowel/Bladder Dysfunction: none  Conservative measures:  Physical therapy: has not participated in PT  Multimodal medical therapy including regular antiinflammatories: Celebrex, Gabapentin, Robaxin , Medrol Dosepak  Injections: 07/03/2023: Left L5-S1 and left S1 transforaminal ESI (dexamethasone  13 mg) 09/13/2020: C5-6 ACDF with Dr. Mont Antis  08/29/2020: Right C3-4 transforaminal ESI (90% relief) 07/18/2020: Left C5-6 transforaminal ESI (90% relief)   Past Surgery: 09/13/2020 C5-6 ACDF Surgeon: Jodeen Munch, MD 07/29/2020 Craniotomy Surgeon: Jodeen Munch, MD  [name] has no symptoms of cervical myelopathy.  The symptoms are causing a significant impact on the patient's life.   I have utilized the care everywhere function in epic to review the outside records available from external health systems.  Review of  Systems:  A 10 point review of systems is negative, except for the pertinent positives and negatives detailed in the HPI.  Past Medical History: Past Medical History:  Diagnosis Date   Depression    Frequency of urination    since radiation tx   GERD (gastroesophageal reflux disease)    Headache    History of traumatic head injury    age 49--  head injury w/ fractured skull in coma for 3 days made full recovery withou further treatment--  no residual   Nocturia    Prostate cancer [facility]) urologist-  dr ottelin/  oncologist- dr Lorri Rota   T1c, Gleason 4+4,  PSA 14.24,  vol 25.6cc/  IM radiation therapy (05-23-2015 to 06-27-2015)     S/P radiation therapy 05-23-2015  to  06-27-2015   prostatic fossa  68.4 Gy in 38 fractions in 1.8 Gy   Subdural hematoma (HCC) 07/29/2020   Type 2 diabetes, diet controlled (HCC)    Ulcer    Wears glasses     Past Surgical History: Past Surgical History:  Procedure Laterality Date   ANTERIOR CERVICAL DECOMP/DISCECTOMY FUSION N/A 09/13/2020   Procedure: C5-6 ANTERIOR CERVICAL DECOMPRESSION/DISCECTOMY FUSION 1 LEVEL;  Surgeon: Jodeen Munch, MD;  Location: [facility] ORS;  Service: Neurosurgery;  Laterality: N/A;   APPENDECTOMY  01/21/1973   COLONOSCOPY  2012 approx   COLONOSCOPY N/A 01/29/2022   Procedure: COLONOSCOPY;  Surgeon: Shane Darling, MD;  Location: [facility] ENDOSCOPY;  Service: Gastroenterology;  Laterality: N/A;   COLONOSCOPY WITH PROPOFOL  N/A 04/29/2016   Procedure: COLONOSCOPY WITH PROPOFOL ;  Surgeon: Cassie Click, MD;  Location: [facility] ENDOSCOPY;  Service: Endoscopy;  Laterality: N/A;   CRANIOTOMY Right  07/29/2020   Procedure: CRANIOTOMY HEMATOMA EVACUATION SUBDURAL;  Surgeon: Jodeen Munch, MD;  Location: [facility] ORS;  Service: Neurosurgery;  Laterality: Right;   CYSTOSCOPY N/A 07/18/2015   Procedure: CYSTOSCOPY FLEXIBLE;  Surgeon: Mark Ottelin, MD;  Location: [facility];  Service: Urology;  Laterality: N/A;  no seeds  found in bladder   ESOPHAGOGASTRODUODENOSCOPY (EGD) WITH PROPOFOL  N/A 02/05/2021   Procedure: ESOPHAGOGASTRODUODENOSCOPY (EGD) WITH PROPOFOL ;  Surgeon: Luke Salaam, MD;  Location: [facility] ENDOSCOPY;  Service: Gastroenterology;  Laterality: N/A;   PROSTATE BIOPSY     RADIOACTIVE SEED IMPLANT N/A 07/18/2015   Procedure: RADIOACTIVE SEED IMPLANT/BRACHYTHERAPY IMPLANT;  Surgeon: Mark Ottelin, MD;  Location: [facility];  Service: Urology;  Laterality: N/A;     48  seeds implanted   SUBDURAL HEMATOMA EVACUATION VIA CRANIOTOMY Right 07/29/2020    Allergies: Allergies as of 07/10/2023   (No Known Allergies)    Medications:  Current Outpatient Medications:    CALCIUM-VITAMIN D PO, Take 1 tablet by mouth daily., Disp: , Rfl:    divalproex (DEPAKOTE) 125 MG DR tablet, Take 2 tablets by mouth 2 (two) times daily., Disp: , Rfl:    Erenumab-aooe (AIMOVIG, 140 MG DOSE, [place]), Inject 1 Dose into the skin every 30 (thirty) days., Disp: , Rfl:    gabapentin (NEURONTIN) 300 MG capsule, Take 2 capsules by mouth 2 (two) times daily., Disp: , Rfl:    Multiple Vitamin (MULTI-VITAMINS) TABS, Take 1 tablet by mouth daily. , Disp: , Rfl:    omeprazole (PRILOSEC) 20 MG capsule, Take 20 mg by mouth daily., Disp: , Rfl:    QUEtiapine (SEROQUEL XR) 200 MG 24 hr tablet, Take 200 mg by mouth at bedtime., Disp: , Rfl:    sertraline (ZOLOFT) 100 MG tablet, Take 100 mg by mouth in the morning and at bedtime., Disp: , Rfl:    SUMAtriptan  (IMITREX ) 100 MG tablet, Take by mouth., Disp: , Rfl:    SUMAtriptan  6 MG/0.5ML SOAJ, Inject into the skin., Disp: , Rfl:    SUMAtriptan  Succinate Refill 6 MG/0.5ML SOCT, INJECT 0.5 ML (6MG ) SUBCUTANEOUSLY AS DIRECTED FOR MIGRAINE. MAY REPEAT IN 1 HOUR IF NEEDED. MAXIMUM OF 2 DOSES (12MG ) IN 24-48 HOURS., Disp: , Rfl:    verapamil (CALAN) 120 MG tablet, Take 120 mg by mouth daily., Disp: , Rfl:    celecoxib (CELEBREX) 100 MG capsule, Take 100 mg by mouth 2 (two) times daily.,  Disp: , Rfl:    levETIRAcetam  (KEPPRA ) 1000 MG tablet, Take 1,000 mg by mouth 2 (two) times daily., Disp: , Rfl:   Social History: Social History   Tobacco Use   Smoking status: Never   Smokeless tobacco: Never  Vaping Use   Vaping status: Never Used  Substance Use Topics   Alcohol use: Yes    Alcohol/week: 7.0 standard drinks of alcohol    Types: 7 Glasses of wine per week    Comment: occasional beer/wine    Drug use: No    Family Medical History: Family History  Problem Relation Age of Onset   Cancer Mother        brain tumor   Multiple sclerosis Mother    Lung cancer Father    Lung cancer Sister    Diabetes Sister     Physical Examination: Vitals:   07/10/23 1012  BP: 124/74    General: Patient is in no apparent distress. Attention to examination is appropriate.  Neck:   Supple.  Full range of motion.  Respiratory: Patient is  breathing without any difficulty.   NEUROLOGICAL:     Awake, alert, oriented to person, place, and time.  Speech is clear and fluent.   Cranial Nerves: Pupils equal round and reactive to light.  Facial tone is symmetric.  Facial sensation is symmetric. Shoulder shrug is symmetric. Tongue protrusion is midline.  There is no pronator drift.  Strength: Side Biceps Triceps Deltoid Interossei Grip Wrist Ext. Wrist Flex.  R 5 5 5 5 5 5 5   L 5 5 5 5 5 5 5    Side Iliopsoas Quads Hamstring PF DF EHL  R 5 5 5 5 5 5   L 5 5 5 5  4- 3   Reflexes are 1+ and symmetric at the biceps, triceps, brachioradialis, patella and achilles.   Hoffman's is absent.   Bilateral upper and lower extremity sensation is intact to light touch with exception of L L5 distribution which is diminished.    No evidence of dysmetria noted.  Gait is antalgic.     Medical Decision Making  Imaging: MRI L spine 07/02/2023 Disc levels:   L1-2: Disc space narrowing and minimal disc bulging with no significant spinal canal or neural foraminal stenosis.   L2-3: Mild,  diffuse disc bulging with mild-to-moderate central spinal canal stenosis and mild-to-moderate bilateral lateral recess stenosis. No apparent nerve root impingement.   L3-4: Mild diffuse disc bulging and bilateral facet arthrosis, with mild central spinal canal stenosis and bilateral lateral recess stenosis. No apparent nerve root impingement.   L4-5: Left posterolateral caudal disc extrusion, which is impinging upon and displacing the left L5 nerve in the lateral recess. The extruded disc fragment measures approximately 8 mm in AP diameter, 11 mm in transverse dimension and 18 mm in craniocaudad length. There is mild-to-moderate bilateral neural foraminal stenosis also present, but no apparent impingement of the L4 nerves.   L5-S1: Disc space narrowing and mild disc bulging and endplate ridging, with mild bilateral neural foraminal stenosis. No apparent nerve root impingement.   IMPRESSION: 1. Left posterolateral caudal disc extrusion at L4-5, with impingement of the left L5 nerve in the lateral recess.     Electronically Signed   By: Maribeth Shivers M.D.   On: 07/02/2023 16:18   MRI C spine 09/24/2022 IMPRESSION: 1. Previous ACDF at C5-6 with apparent sufficient patency of the canal and foramina. 2. C6-7: Spondylosis more pronounced towards the left with endplate osteophytes and bulging of the disc. Narrowing of the ventral subarachnoid space. AP diameter of the canal in the midline 8.4 mm. Slight indentation of the cord. Foraminal stenosis on the left that could affect the C7 nerve. Findings at this level have worsened slightly since 2022. 3. C4-5: Endplate osteophytes and bulging of the disc. Bilateral foraminal stenosis that could affect either C5 nerve. This appears more pronounced on the left. 4. C3-4: Endplate osteophytes with uncovertebral hypertrophy right more than left. Mild facet osteoarthritis on the right. Foraminal stenosis right worse than left. The right  C4 nerve could be affected.     Electronically Signed   By: Bettylou Brunner M.D.   On: 10/09/2022 18:47 I have personally reviewed the images and agree with the above interpretation.  Assessment and Plan: Mr. Rickey is a pleasant 68 y.o. 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  He has a severe lumbar radiculopathy.  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 MRI shows L4/5 disc herniation compressing nerve root. No relief from injection.  Due to his objective weakness, there is rationale to consider early surgery.  He would like to pursue this.  We did review that he has slightly elevated risk for infection given his recent epidural steroid injection.  He accepts that risk given his weakness.  Will plan for left L4-5 microdiscectomy.  I discussed the planned procedure at length with the patient, including the risks, benefits, alternatives, and indications. The risks discussed include but are not limited to bleeding, infection, need for reoperation, spinal fluid leak, stroke, vision loss, anesthetic complication, coma, paralysis, and even death. I also described in detail that improvement was not guaranteed.  The patient expressed understanding of these risks, and asked that we proceed with surgery. I described the surgery in layman's terms, and gave ample opportunity for questions, which were answered to the best of my ability.  I spent a total of 30 minutes in this patient's care today. This time was spent reviewing pertinent records including imaging studies, obtaining and confirming history, performing a directed evaluation, formulating and discussing my recommendations, and documenting the visit within the medical record.     Thank you for involving me in the care of this patient.      Shanik Brookshire K. Mont Antis MD, [facility]

## 2023-07-08 NOTE — H&P (View-Only) (Signed)
 Referring Physician:  Brant Caldron, FNP [address] [place],  [place] [address]  Primary Physician:  Lyle San, MD  History of Present Illness: 07/10/2023 [name] is here today with a chief complaint of persistent back pain and leg weakness. He was referred for evaluation of his back pain and leg weakness.  Back pain began a month ago during a trip to the [place] after lifting a bag and worsened after feeling a 'pop' while standing from a recliner. Pain persists despite an injection from physiatry, especially with movement, standing, or sitting.  The pain is localized to the back without radiation to the buttock. There is numbness and instability in the right leg, and significant pain occurs when standing in one position. The left leg feels weak, described as 'flopping' like a 'wet noodle' when walking.  Pain is severe in the morning, making it difficult to get out of bed, and is only absent when not moving. He has not initiated any exercises for his condition. Sleep is disrupted due to pain, as movement during sleep causes awakening.  Bowel/Bladder Dysfunction: none  Conservative measures:  Physical therapy: has not participated in PT  Multimodal medical therapy including regular antiinflammatories: Celebrex, Gabapentin, Robaxin , Medrol Dosepak  Injections: 07/03/2023: Left L5-S1 and left S1 transforaminal ESI (dexamethasone  13 mg) 09/13/2020: C5-6 ACDF with Dr. Mont Antis  08/29/2020: Right C3-4 transforaminal ESI (90% relief) 07/18/2020: Left C5-6 transforaminal ESI (90% relief)   Past Surgery: 09/13/2020 C5-6 ACDF Surgeon: Jodeen Munch, MD 07/29/2020 Craniotomy Surgeon: Jodeen Munch, MD  [name] has no symptoms of cervical myelopathy.  The symptoms are causing a significant impact on the patient's life.   I have utilized the care everywhere function in epic to review the outside records available from external health systems.  Review of  Systems:  A 10 point review of systems is negative, except for the pertinent positives and negatives detailed in the HPI.  Past Medical History: Past Medical History:  Diagnosis Date   Depression    Frequency of urination    since radiation tx   GERD (gastroesophageal reflux disease)    Headache    History of traumatic head injury    age 49--  head injury w/ fractured skull in coma for 3 days made full recovery withou further treatment--  no residual   Nocturia    Prostate cancer [facility]) urologist-  dr ottelin/  oncologist- dr Lorri Rota   T1c, Gleason 4+4,  PSA 14.24,  vol 25.6cc/  IM radiation therapy (05-23-2015 to 06-27-2015)     S/P radiation therapy 05-23-2015  to  06-27-2015   prostatic fossa  68.4 Gy in 38 fractions in 1.8 Gy   Subdural hematoma (HCC) 07/29/2020   Type 2 diabetes, diet controlled (HCC)    Ulcer    Wears glasses     Past Surgical History: Past Surgical History:  Procedure Laterality Date   ANTERIOR CERVICAL DECOMP/DISCECTOMY FUSION N/A 09/13/2020   Procedure: C5-6 ANTERIOR CERVICAL DECOMPRESSION/DISCECTOMY FUSION 1 LEVEL;  Surgeon: Jodeen Munch, MD;  Location: [facility] ORS;  Service: Neurosurgery;  Laterality: N/A;   APPENDECTOMY  01/21/1973   COLONOSCOPY  2012 approx   COLONOSCOPY N/A 01/29/2022   Procedure: COLONOSCOPY;  Surgeon: Shane Darling, MD;  Location: [facility] ENDOSCOPY;  Service: Gastroenterology;  Laterality: N/A;   COLONOSCOPY WITH PROPOFOL  N/A 04/29/2016   Procedure: COLONOSCOPY WITH PROPOFOL ;  Surgeon: Cassie Click, MD;  Location: [facility] ENDOSCOPY;  Service: Endoscopy;  Laterality: N/A;   CRANIOTOMY Right  07/29/2020   Procedure: CRANIOTOMY HEMATOMA EVACUATION SUBDURAL;  Surgeon: Jodeen Munch, MD;  Location: [facility] ORS;  Service: Neurosurgery;  Laterality: Right;   CYSTOSCOPY N/A 07/18/2015   Procedure: CYSTOSCOPY FLEXIBLE;  Surgeon: Mark Ottelin, MD;  Location: [facility];  Service: Urology;  Laterality: N/A;  no seeds  found in bladder   ESOPHAGOGASTRODUODENOSCOPY (EGD) WITH PROPOFOL  N/A 02/05/2021   Procedure: ESOPHAGOGASTRODUODENOSCOPY (EGD) WITH PROPOFOL ;  Surgeon: Luke Salaam, MD;  Location: [facility] ENDOSCOPY;  Service: Gastroenterology;  Laterality: N/A;   PROSTATE BIOPSY     RADIOACTIVE SEED IMPLANT N/A 07/18/2015   Procedure: RADIOACTIVE SEED IMPLANT/BRACHYTHERAPY IMPLANT;  Surgeon: Mark Ottelin, MD;  Location: [facility];  Service: Urology;  Laterality: N/A;     48  seeds implanted   SUBDURAL HEMATOMA EVACUATION VIA CRANIOTOMY Right 07/29/2020    Allergies: Allergies as of 07/10/2023   (No Known Allergies)    Medications:  Current Outpatient Medications:    CALCIUM-VITAMIN D PO, Take 1 tablet by mouth daily., Disp: , Rfl:    divalproex (DEPAKOTE) 125 MG DR tablet, Take 2 tablets by mouth 2 (two) times daily., Disp: , Rfl:    Erenumab-aooe (AIMOVIG, 140 MG DOSE, [place]), Inject 1 Dose into the skin every 30 (thirty) days., Disp: , Rfl:    gabapentin (NEURONTIN) 300 MG capsule, Take 2 capsules by mouth 2 (two) times daily., Disp: , Rfl:    Multiple Vitamin (MULTI-VITAMINS) TABS, Take 1 tablet by mouth daily. , Disp: , Rfl:    omeprazole (PRILOSEC) 20 MG capsule, Take 20 mg by mouth daily., Disp: , Rfl:    QUEtiapine (SEROQUEL XR) 200 MG 24 hr tablet, Take 200 mg by mouth at bedtime., Disp: , Rfl:    sertraline (ZOLOFT) 100 MG tablet, Take 100 mg by mouth in the morning and at bedtime., Disp: , Rfl:    SUMAtriptan  (IMITREX ) 100 MG tablet, Take by mouth., Disp: , Rfl:    SUMAtriptan  6 MG/0.5ML SOAJ, Inject into the skin., Disp: , Rfl:    SUMAtriptan  Succinate Refill 6 MG/0.5ML SOCT, INJECT 0.5 ML (6MG ) SUBCUTANEOUSLY AS DIRECTED FOR MIGRAINE. MAY REPEAT IN 1 HOUR IF NEEDED. MAXIMUM OF 2 DOSES (12MG ) IN 24-48 HOURS., Disp: , Rfl:    verapamil (CALAN) 120 MG tablet, Take 120 mg by mouth daily., Disp: , Rfl:    celecoxib (CELEBREX) 100 MG capsule, Take 100 mg by mouth 2 (two) times daily.,  Disp: , Rfl:    levETIRAcetam  (KEPPRA ) 1000 MG tablet, Take 1,000 mg by mouth 2 (two) times daily., Disp: , Rfl:   Social History: Social History   Tobacco Use   Smoking status: Never   Smokeless tobacco: Never  Vaping Use   Vaping status: Never Used  Substance Use Topics   Alcohol use: Yes    Alcohol/week: 7.0 standard drinks of alcohol    Types: 7 Glasses of wine per week    Comment: occasional beer/wine    Drug use: No    Family Medical History: Family History  Problem Relation Age of Onset   Cancer Mother        brain tumor   Multiple sclerosis Mother    Lung cancer Father    Lung cancer Sister    Diabetes Sister     Physical Examination: Vitals:   07/10/23 1012  BP: 124/74    General: Patient is in no apparent distress. Attention to examination is appropriate.  Neck:   Supple.  Full range of motion.  Respiratory: Patient is  breathing without any difficulty.   NEUROLOGICAL:     Awake, alert, oriented to person, place, and time.  Speech is clear and fluent.   Cranial Nerves: Pupils equal round and reactive to light.  Facial tone is symmetric.  Facial sensation is symmetric. Shoulder shrug is symmetric. Tongue protrusion is midline.  There is no pronator drift.  Strength: Side Biceps Triceps Deltoid Interossei Grip Wrist Ext. Wrist Flex.  R 5 5 5 5 5 5 5   L 5 5 5 5 5 5 5    Side Iliopsoas Quads Hamstring PF DF EHL  R 5 5 5 5 5 5   L 5 5 5 5  4- 3   Reflexes are 1+ and symmetric at the biceps, triceps, brachioradialis, patella and achilles.   Hoffman's is absent.   Bilateral upper and lower extremity sensation is intact to light touch with exception of L L5 distribution which is diminished.    No evidence of dysmetria noted.  Gait is antalgic.     Medical Decision Making  Imaging: MRI L spine 07/02/2023 Disc levels:   L1-2: Disc space narrowing and minimal disc bulging with no significant spinal canal or neural foraminal stenosis.   L2-3: Mild,  diffuse disc bulging with mild-to-moderate central spinal canal stenosis and mild-to-moderate bilateral lateral recess stenosis. No apparent nerve root impingement.   L3-4: Mild diffuse disc bulging and bilateral facet arthrosis, with mild central spinal canal stenosis and bilateral lateral recess stenosis. No apparent nerve root impingement.   L4-5: Left posterolateral caudal disc extrusion, which is impinging upon and displacing the left L5 nerve in the lateral recess. The extruded disc fragment measures approximately 8 mm in AP diameter, 11 mm in transverse dimension and 18 mm in craniocaudad length. There is mild-to-moderate bilateral neural foraminal stenosis also present, but no apparent impingement of the L4 nerves.   L5-S1: Disc space narrowing and mild disc bulging and endplate ridging, with mild bilateral neural foraminal stenosis. No apparent nerve root impingement.   IMPRESSION: 1. Left posterolateral caudal disc extrusion at L4-5, with impingement of the left L5 nerve in the lateral recess.     Electronically Signed   By: Maribeth Shivers M.D.   On: 07/02/2023 16:18   MRI C spine 09/24/2022 IMPRESSION: 1. Previous ACDF at C5-6 with apparent sufficient patency of the canal and foramina. 2. C6-7: Spondylosis more pronounced towards the left with endplate osteophytes and bulging of the disc. Narrowing of the ventral subarachnoid space. AP diameter of the canal in the midline 8.4 mm. Slight indentation of the cord. Foraminal stenosis on the left that could affect the C7 nerve. Findings at this level have worsened slightly since 2022. 3. C4-5: Endplate osteophytes and bulging of the disc. Bilateral foraminal stenosis that could affect either C5 nerve. This appears more pronounced on the left. 4. C3-4: Endplate osteophytes with uncovertebral hypertrophy right more than left. Mild facet osteoarthritis on the right. Foraminal stenosis right worse than left. The right  C4 nerve could be affected.     Electronically Signed   By: Bettylou Brunner M.D.   On: 10/09/2022 18:47 I have personally reviewed the images and agree with the above interpretation.  Assessment and Plan: Mr. Rickey is a pleasant 68 y.o. 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  He has a severe lumbar radiculopathy.  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 MRI shows L4/5 disc herniation compressing nerve root. No relief from injection.  Due to his objective weakness, there is rationale to consider early surgery.  He would like to pursue this.  We did review that he has slightly elevated risk for infection given his recent epidural steroid injection.  He accepts that risk given his weakness.  Will plan for left L4-5 microdiscectomy.  I discussed the planned procedure at length with the patient, including the risks, benefits, alternatives, and indications. The risks discussed include but are not limited to bleeding, infection, need for reoperation, spinal fluid leak, stroke, vision loss, anesthetic complication, coma, paralysis, and even death. I also described in detail that improvement was not guaranteed.  The patient expressed understanding of these risks, and asked that we proceed with surgery. I described the surgery in layman's terms, and gave ample opportunity for questions, which were answered to the best of my ability.  I spent a total of 30 minutes in this patient's care today. This time was spent reviewing pertinent records including imaging studies, obtaining and confirming history, performing a directed evaluation, formulating and discussing my recommendations, and documenting the visit within the medical record.     Thank you for involving me in the care of this patient.      Shanik Brookshire K. Mont Antis MD, [facility]

## 2023-07-10 ENCOUNTER — Ambulatory Visit: Admitting: Neurosurgery

## 2023-07-10 ENCOUNTER — Encounter: Payer: Self-pay | Admitting: Neurosurgery

## 2023-07-10 ENCOUNTER — Other Ambulatory Visit: Payer: Self-pay

## 2023-07-10 VITALS — BP 124/74 | Ht 75.0 in | Wt 186.0 lb

## 2023-07-10 DIAGNOSIS — M5116 Intervertebral disc disorders with radiculopathy, lumbar region: Secondary | ICD-10-CM | POA: Diagnosis not present

## 2023-07-10 DIAGNOSIS — R29898 Other symptoms and signs involving the musculoskeletal system: Secondary | ICD-10-CM | POA: Diagnosis not present

## 2023-07-10 DIAGNOSIS — Z01818 Encounter for other preprocedural examination: Secondary | ICD-10-CM

## 2023-07-10 DIAGNOSIS — M5416 Radiculopathy, lumbar region: Secondary | ICD-10-CM

## 2023-07-10 DIAGNOSIS — M21372 Foot drop, left foot: Secondary | ICD-10-CM | POA: Diagnosis not present

## 2023-07-10 NOTE — Addendum Note (Signed)
 Addended by: Landyn Lorincz on: 07/10/2023 11:48 AM   Modules accepted: Orders

## 2023-07-10 NOTE — Patient Instructions (Signed)
 Please see below for information in regards to your upcoming surgery:   Planned surgery: Left L4-5 microdiscectomy    Surgery date: 07/16/23 at [facility]: [address] [place], [place] [address]) - you will find out your arrival time the business day before your surgery.   Pre-op appointment at [facility] Pre-admit Testing: you will receive a call with a date/time for this appointment. If you are scheduled for an in person appointment, Pre-admit Testing is located on the first floor of the [facility] building, [address], [address]. During this appointment, they will advise you which medications you can take the morning of surgery, and which medications you will need to hold for surgery. Labs (such as blood work, EKG) may be done at your pre-op appointment. You are not required to fast for these labs. Should you need to change your pre-op appointment, please call Pre-admit testing at [phone].     Common restrictions after surgery: No bending, lifting, or twisting ("BLT"). Avoid lifting objects heavier than 10 pounds for the first 6 weeks after surgery. Where possible, avoid household activities that involve lifting, bending, reaching, pushing, or pulling such as laundry, vacuuming, grocery shopping, and childcare. Try to arrange for help from friends and family for these activities while you heal. Do not drive while taking prescription pain medication. Weeks 6 through 12 after surgery: avoid lifting more than 25 pounds.     How to contact us :  If you have any questions/concerns before or after surgery, you can reach us  at [phone], or you can send a mychart message. We can be reached by phone or mychart 8am-4pm, Monday-Friday.  *Please note: Calls after 4pm are forwarded to a third party answering service. Mychart messages are not routinely monitored during evenings, weekends, and holidays. Please call our office to contact the  answering service for urgent concerns during non-business hours.     If you have FMLA/disability paperwork, please drop it off or fax it to [phone], attention Patty.   Appointments/FMLA & disability paperwork: Gerlean Kocher, & Maryann Smalls Registered Nurses/Surgery schedulers: Cardin Nitschke & Lauren Medical Assistants: Donnajean Fuse Physician Assistants: Ludwig Safer, PA-C, Anastacio Karvonen, PA-C & Lucetta Russel, PA-C Surgeons: Jodeen Munch, MD & Henderson Lock, MD   [facility] PREADMIT TESTING VISIT and SURGERY INFORMATION SHEET   Now that surgery has been scheduled you can anticipate several phone calls from [facility] services. A pharmacy technician will call you to verify your current list of medications taken at home.               The Pre-Service Center will call to verify your insurance information and to give you billing estimates and information.             The Preadmit Testing Office will be calling to schedule a visit to obtain information for the anesthesia team and provide instructions on preparation for surgery.  What can you expect for the Preadmit Testing Visit: Appointments may be scheduled in-person or by telephone.  If a telephone visit is scheduled, you may be asked to come into the office to have lab tests or other studies performed.   This visit will not be completed any greater than 14 days prior to your surgery.  If your surgery has been scheduled for a future date, please do not be alarmed if we have not contacted you to schedule an appointment more than a month prior to the surgery date.  Please be prepared to provide the following information during this appointment:            -Personal medical history                                               -Medication and allergy list            -Any history of problems with anesthesia              -Recent lab work or diagnostic studies            -Please notify us  of any needs we should be aware  of to provide the best care possible           -You will be provided with instructions on how to prepare for your surgery.    On The Day of Surgery:  You must have a driver to take you home after surgery, you will be asked not to drive for 24 hours following surgery.  Taxi, [employer] and non-medical transport will not be acceptable means of transportation unless you have a responsible individual who will be traveling with you.  Visitors in the surgical area:   2 people will be able to visit you in your room once your preparation for surgery has been completed. During surgery, your visitors will be asked to wait in the Surgery Waiting Area.  It is not a requirement for them to stay, if they prefer to leave and come back.  Your visitor(s) will be given an update once the surgery has been completed.  No visitors are allowed in the initial recovery room to respect patient privacy and safety.  Once you are more awake and transfer to the secondary recovery area, or are transferred to an inpatient room, visitors will again be able to see you.  To respect and protect your privacy: We will ask on the day of surgery who your driver will be and what the contact number for that individual will be. We will ask if it is okay to share information with this individual, or if there is an alternative individual that we, or the surgeon, should contact to provide updates and information. If family or friends come to the surgical information desk requesting information about you, who you have not listed with us , no information will be given.   It may be helpful to designate someone as the main contact who will be responsible for updating your other friends and family.    PREADMIT TESTING OFFICE: [phone] SAME DAY SURGERY: [phone] We look forward to caring for you before and throughout the process of your surgery.

## 2023-07-11 ENCOUNTER — Other Ambulatory Visit: Payer: Self-pay

## 2023-07-11 ENCOUNTER — Encounter
Admission: RE | Admit: 2023-07-11 | Discharge: 2023-07-11 | Disposition: A | Discharge status: Home / Self Care | Source: Ambulatory Visit | Attending: Neurosurgery | Admitting: Neurosurgery

## 2023-07-11 VITALS — BP 115/59 | HR 60 | Resp 12 | Ht 75.0 in | Wt 199.0 lb

## 2023-07-11 DIAGNOSIS — E119 Type 2 diabetes mellitus without complications: Secondary | ICD-10-CM | POA: Insufficient documentation

## 2023-07-11 DIAGNOSIS — Z01810 Encounter for preprocedural cardiovascular examination: Secondary | ICD-10-CM | POA: Diagnosis present

## 2023-07-11 DIAGNOSIS — R001 Bradycardia, unspecified: Secondary | ICD-10-CM | POA: Diagnosis not present

## 2023-07-11 NOTE — Patient Instructions (Addendum)
 Your procedure is scheduled on: Wednesday 07/16/23 To find out your arrival time, please call [phone] between 1PM - 3PM on:  Tuesday 07/15/23  Report to the Registration Desk on the 1st floor of the [facility]. Free Valet parking is available.  If your arrival time is 6:00 am, do not arrive before that time as the [facility] entrance doors do not open until 6:00 am.  REMEMBER: Instructions that are not followed completely may result in serious medical risk, up to and including death; or upon the discretion of your surgeon and anesthesiologist your surgery may need to be rescheduled.  Do not eat food after midnight the night before surgery.  No gum chewing or hard candies.  You may however, drink CLEAR liquids up to 2 hours before you are scheduled to arrive for your surgery. Do not drink anything within 2 hours of your scheduled arrival time.  Clear liquids include: - water   Type 1 and Type 2 diabetics should only drink water.  IOne week prior to surgery: Stop Anti-inflammatories (NSAIDS) such as Advil, Aleve, Ibuprofen, Motrin, Naproxen, Naprosyn and Aspirin based products such as Excedrin, Goody's Powder, BC Powder. You may however, continue to take Tylenol  if needed for pain up until the day of surgery.  Stop ANY OVER THE COUNTER supplements and vitamins TODAY until after surgery.  Continue taking all prescribed medications as usual.  TAKE ONLY THESE MEDICATIONS THE MORNING OF SURGERY WITH A SIP OF WATER:  divalproex (DEPAKOTE) 250 MG   gabapentin (NEURONTIN) 400 MG capsule  omeprazole (PRILOSEC) 20 MG capsule Antacid (take one the night before and one on the morning of surgery - helps to prevent nausea after surgery.) sertraline (ZOLOFT) 100 MG tablet  verapamil (CALAN) 120 MG tablet   No Alcohol for 24 hours before or after surgery.  No Smoking including e-cigarettes for 24 hours before surgery.  No chewable tobacco products for at least 6 hours before surgery.   No nicotine patches on the day of surgery.  Do not use any recreational drugs for at least a week (preferably 2 weeks) before your surgery.  Please be advised that the combination of cocaine and anesthesia may have negative outcomes, up to and including death. If you test positive for cocaine, your surgery will be cancelled.  On the morning of surgery brush your teeth with toothpaste and water, you may rinse your mouth with mouthwash if you wish. Do not swallow any toothpaste or mouthwash.  Use CHG Soap or wipes as directed on instruction sheet.  Do not wear lotions, powders, or perfumes. No Deodorant  Do not shave body hair from the neck down 48 hours before surgery.  Wear comfortable clothing (specific to your surgery type) to the hospital.  Do not wear jewelry, make-up, hairpins, clips or nail polish.  For welded (permanent) jewelry: bracelets, anklets, waist bands, etc.  Please have this removed prior to surgery.  If it is not removed, there is a chance that hospital personnel will need to cut it off on the day of surgery. Contact lenses, hearing aids and dentures may not be worn into surgery.  Do not bring valuables to the hospital. [facility] is not responsible for any missing/lost belongings or valuables.   Notify your doctor if there is any change in your medical condition (cold, fever, infection).  If you are being discharged the day of surgery, you will not be allowed to drive home. You will need a responsible individual to drive you home  and stay with you for 24 hours after surgery.   After surgery, you can help prevent lung complications by doing breathing exercises.  Take deep breaths and cough every 1-2 hours. Your doctor may order a device called an Incentive Spirometer to help you take deep breaths.  Surgery Visitation Policy:  Patients undergoing a surgery or procedure may have two family members or support persons with them as long as the person is not  COVID-19 positive or experiencing its symptoms.   Please call the Pre-admissions Testing Dept. at [phone] if you have any questions about these instructions.    Pre-operative 5 CHG Bath Instructions   You can play a key role in reducing the risk of infection after surgery. Your skin needs to be as free of germs as possible. You can reduce the number of germs on your skin by washing with CHG (chlorhexidine  gluconate) soap before surgery. CHG is an antiseptic soap that kills germs and continues to kill germs even after washing.   DO NOT use if you have an allergy to chlorhexidine /CHG or antibacterial soaps. If your skin becomes reddened or irritated, stop using the CHG and notify one of our RNs at [phone].   Please shower with the CHG soap starting 4 days before surgery using the following schedule:   Saturday 07/12/23 - Wednesday 07/16/23    Please keep in mind the following:  DO NOT shave, including legs and underarms, starting the day of your first shower.   You may shave your face at any point before/day of surgery.  Place clean sheets on your bed the day you start using CHG soap. Use a clean washcloth (not used since being washed) for each shower. DO NOT sleep with pets once you start using the CHG.   CHG Shower Instructions:  If you choose to wash your hair and private area, wash first with your normal shampoo/soap.  After you use shampoo/soap, rinse your hair and body thoroughly to remove shampoo/soap residue.  Turn the water OFF and apply about 3 tablespoons (45 ml) of CHG soap to a CLEAN washcloth.  Apply CHG soap ONLY FROM YOUR NECK DOWN TO YOUR TOES (washing for 3-5 minutes)  DO NOT use CHG soap on face, private areas, open wounds, or sores.  Pay special attention to the area where your surgery is being performed.  If you are having back surgery, having someone wash your back for you may be helpful. Wait 2 minutes after CHG soap is applied, then you may rinse off  the CHG soap.  Pat dry with a clean towel  Put on clean clothes/pajamas   If you choose to wear lotion, please use ONLY the CHG-compatible lotions on the back of this paper.     Additional instructions for the day of surgery: DO NOT APPLY any lotions, deodorants, cologne, or perfumes.   Put on clean/comfortable clothes.  Brush your teeth.  Ask your nurse before applying any prescription medications to the skin.      CHG Compatible Lotions   Aveeno Moisturizing lotion  Cetaphil Moisturizing Cream  Cetaphil Moisturizing Lotion  Clairol Herbal Essence Moisturizing Lotion, Dry Skin  Clairol Herbal Essence Moisturizing Lotion, Extra Dry Skin  Clairol Herbal Essence Moisturizing Lotion, Normal Skin  Curel Age Defying Therapeutic Moisturizing Lotion with Alpha Hydroxy  Curel Extreme Care Body Lotion  Curel Soothing Hands Moisturizing Hand Lotion  Curel Therapeutic Moisturizing Cream, Fragrance-Free  Curel Therapeutic Moisturizing Lotion, Fragrance-Free  Curel Therapeutic Moisturizing Lotion, Original Formula  Eucerin Daily Replenishing Lotion  Eucerin Dry Skin Therapy Plus Alpha Hydroxy Crme  Eucerin Dry Skin Therapy Plus Alpha Hydroxy Lotion  Eucerin Original Crme  Eucerin Original Lotion  Eucerin Plus Crme Eucerin Plus Lotion  Eucerin TriLipid Replenishing Lotion  Keri Anti-Bacterial Hand Lotion  Keri Deep Conditioning Original Lotion Dry Skin Formula Softly Scented  Keri Deep Conditioning Original Lotion, Fragrance Free Sensitive Skin Formula  Keri Lotion Fast Absorbing Fragrance Free Sensitive Skin Formula  Keri Lotion Fast Absorbing Softly Scented Dry Skin Formula  Keri Original Lotion  Keri Skin Renewal Lotion Keri Silky Smooth Lotion  Keri Silky Smooth Sensitive Skin Lotion  Nivea Body Creamy Conditioning Oil  Nivea Body Extra Enriched Teacher, adult education Moisturizing Lotion Nivea Crme  Nivea Skin Firming Lotion  NutraDerm 30 Skin  Lotion  NutraDerm Skin Lotion  NutraDerm Therapeutic Skin Cream  NutraDerm Therapeutic Skin Lotion  ProShield Protective Hand Cream  Provon moisturizing lotion

## 2023-07-16 ENCOUNTER — Ambulatory Visit

## 2023-07-16 ENCOUNTER — Encounter: Payer: Self-pay | Admitting: Neurosurgery

## 2023-07-16 ENCOUNTER — Other Ambulatory Visit: Payer: Self-pay

## 2023-07-16 ENCOUNTER — Ambulatory Visit: Payer: Self-pay | Admitting: Certified Registered"

## 2023-07-16 ENCOUNTER — Encounter
Admission: RE | Disposition: A | Discharge status: Home / Self Care | Payer: Self-pay | Source: Ambulatory Visit | Attending: Neurosurgery

## 2023-07-16 ENCOUNTER — Ambulatory Visit
Admission: RE | Admit: 2023-07-16 | Discharge: 2023-07-16 | Disposition: A | Discharge status: Home / Self Care | Source: Ambulatory Visit | Attending: Neurosurgery | Admitting: Neurosurgery

## 2023-07-16 DIAGNOSIS — K219 Gastro-esophageal reflux disease without esophagitis: Secondary | ICD-10-CM | POA: Diagnosis not present

## 2023-07-16 DIAGNOSIS — R29898 Other symptoms and signs involving the musculoskeletal system: Secondary | ICD-10-CM | POA: Diagnosis present

## 2023-07-16 DIAGNOSIS — M5416 Radiculopathy, lumbar region: Secondary | ICD-10-CM | POA: Diagnosis not present

## 2023-07-16 DIAGNOSIS — M5116 Intervertebral disc disorders with radiculopathy, lumbar region: Secondary | ICD-10-CM | POA: Diagnosis present

## 2023-07-16 DIAGNOSIS — Z01818 Encounter for other preprocedural examination: Secondary | ICD-10-CM

## 2023-07-16 DIAGNOSIS — E119 Type 2 diabetes mellitus without complications: Secondary | ICD-10-CM | POA: Diagnosis not present

## 2023-07-16 HISTORY — PX: LUMBAR LAMINECTOMY/DECOMPRESSION MICRODISCECTOMY: SHX5026

## 2023-07-16 SURGERY — LUMBAR LAMINECTOMY/DECOMPRESSION MICRODISCECTOMY 1 LEVEL
Anesthesia: General | Site: Spine Lumbar | Laterality: Left

## 2023-07-16 MED ORDER — 0.9 % SODIUM CHLORIDE (POUR BTL) OPTIME
TOPICAL | Status: DC | PRN
  Administered 2023-07-16: 500 mL

## 2023-07-16 MED ORDER — MIDAZOLAM HCL 2 MG/2ML IJ SOLN
INTRAMUSCULAR | Status: AC
  Filled 2023-07-16: qty 2

## 2023-07-16 MED ORDER — CHLORHEXIDINE GLUCONATE 0.12 % MT SOLN
OROMUCOSAL | Status: AC
  Filled 2023-07-16: qty 15

## 2023-07-16 MED ORDER — OXYCODONE HCL 5 MG/5ML PO SOLN: 5.0000 mg | Freq: Once | ORAL | Status: DC | PRN

## 2023-07-16 MED ORDER — FENTANYL CITRATE (PF) 100 MCG/2ML IJ SOLN
INTRAMUSCULAR | Status: DC | PRN
  Administered 2023-07-16: 50 ug via INTRAVENOUS

## 2023-07-16 MED ORDER — DEXMEDETOMIDINE HCL IN NACL 200 MCG/50ML IV SOLN
INTRAVENOUS | Status: DC | PRN
  Administered 2023-07-16: 4 ug via INTRAVENOUS

## 2023-07-16 MED ORDER — LIDOCAINE HCL (PF) 2 % IJ SOLN
INTRAMUSCULAR | Status: AC
  Filled 2023-07-16: qty 10

## 2023-07-16 MED ORDER — ORAL CARE MOUTH RINSE
15.0000 mL | Freq: Once | OROMUCOSAL | Status: AC
Start: 2023-07-16 — End: 2023-07-16

## 2023-07-16 MED ORDER — VASOPRESSIN 20 UNIT/ML IV SOLN
INTRAVENOUS | Status: AC
  Filled 2023-07-16: qty 1

## 2023-07-16 MED ORDER — MIDAZOLAM HCL 2 MG/2ML IJ SOLN
INTRAMUSCULAR | Status: DC | PRN
  Administered 2023-07-16: 2 mg via INTRAVENOUS

## 2023-07-16 MED ORDER — BUPIVACAINE HCL (PF) 0.5 % IJ SOLN
INTRAMUSCULAR | Status: AC
  Filled 2023-07-16: qty 30

## 2023-07-16 MED ORDER — OXYCODONE HCL 5 MG PO TABS: 5.0000 mg | ORAL_TABLET | Freq: Once | ORAL | Status: DC | PRN

## 2023-07-16 MED ORDER — ACETAMINOPHEN 10 MG/ML IV SOLN
INTRAVENOUS | Status: AC
  Filled 2023-07-16: qty 100

## 2023-07-16 MED ORDER — SODIUM CHLORIDE (PF) 0.9 % IJ SOLN
INTRAMUSCULAR | Status: DC | PRN
  Administered 2023-07-16: 60 mL via INTRAMUSCULAR

## 2023-07-16 MED ORDER — BUPIVACAINE-EPINEPHRINE (PF) 0.5% -1:200000 IJ SOLN
INTRAMUSCULAR | Status: DC | PRN
Start: 2023-07-16 — End: 2023-07-16
  Administered 2023-07-16: 7 mL

## 2023-07-16 MED ORDER — IBUPROFEN 800 MG PO TABS
800.0000 mg | ORAL_TABLET | Freq: Three times a day (TID) | ORAL | 0 refills | Status: DC
  Filled 2023-07-16: qty 21, 7d supply, fill #0

## 2023-07-16 MED ORDER — DEXAMETHASONE SODIUM PHOSPHATE 10 MG/ML IJ SOLN
INTRAMUSCULAR | Status: DC | PRN
  Administered 2023-07-16: 10 mg via INTRAVENOUS

## 2023-07-16 MED ORDER — CHLORHEXIDINE GLUCONATE 0.12 % MT SOLN
15.0000 mL | Freq: Once | OROMUCOSAL | Status: AC
  Administered 2023-07-16: 15 mL via OROMUCOSAL

## 2023-07-16 MED ORDER — LIDOCAINE HCL (CARDIAC) PF 100 MG/5ML IV SOSY
PREFILLED_SYRINGE | INTRAVENOUS | Status: DC | PRN
  Administered 2023-07-16: 100 mg via INTRAVENOUS

## 2023-07-16 MED ORDER — ONDANSETRON HCL 4 MG/2ML IJ SOLN
INTRAMUSCULAR | Status: DC | PRN
  Administered 2023-07-16 (×2): 4 mg via INTRAVENOUS

## 2023-07-16 MED ORDER — PHENYLEPHRINE 80 MCG/ML (10ML) SYRINGE FOR IV PUSH (FOR BLOOD PRESSURE SUPPORT)
PREFILLED_SYRINGE | INTRAVENOUS | Status: AC
Start: 2023-07-16 — End: 2023-07-16
  Filled 2023-07-16: qty 10

## 2023-07-16 MED ORDER — SODIUM CHLORIDE 0.9 % IV SOLN: INTRAVENOUS | Status: DC

## 2023-07-16 MED ORDER — PHENYLEPHRINE 80 MCG/ML (10ML) SYRINGE FOR IV PUSH (FOR BLOOD PRESSURE SUPPORT)
PREFILLED_SYRINGE | INTRAVENOUS | Status: DC | PRN
  Administered 2023-07-16 (×3): 160 ug via INTRAVENOUS
  Administered 2023-07-16: 80 ug via INTRAVENOUS

## 2023-07-16 MED ORDER — EPHEDRINE SULFATE-NACL 50-0.9 MG/10ML-% IV SOSY
PREFILLED_SYRINGE | INTRAVENOUS | Status: DC | PRN
  Administered 2023-07-16: 5 mg via INTRAVENOUS
  Administered 2023-07-16 (×3): 10 mg via INTRAVENOUS

## 2023-07-16 MED ORDER — SUCCINYLCHOLINE CHLORIDE 200 MG/10ML IV SOSY
PREFILLED_SYRINGE | INTRAVENOUS | Status: DC | PRN
  Administered 2023-07-16: 100 mg via INTRAVENOUS

## 2023-07-16 MED ORDER — PHENYLEPHRINE 80 MCG/ML (10ML) SYRINGE FOR IV PUSH (FOR BLOOD PRESSURE SUPPORT)
PREFILLED_SYRINGE | INTRAVENOUS | Status: AC
  Filled 2023-07-16: qty 10

## 2023-07-16 MED ORDER — PROPOFOL 1000 MG/100ML IV EMUL
INTRAVENOUS | Status: AC
  Filled 2023-07-16: qty 100

## 2023-07-16 MED ORDER — VASOPRESSIN 20 UNIT/ML IV SOLN
INTRAVENOUS | Status: DC | PRN
Start: 2023-07-16 — End: 2023-07-16
  Administered 2023-07-16 (×2): 2 [IU] via INTRAVENOUS

## 2023-07-16 MED ORDER — CEFAZOLIN SODIUM-DEXTROSE 2-4 GM/100ML-% IV SOLN
2.0000 g | Freq: Once | INTRAVENOUS | Status: AC
  Administered 2023-07-16: 2 g via INTRAVENOUS

## 2023-07-16 MED ORDER — PHENYLEPHRINE HCL-NACL 20-0.9 MG/250ML-% IV SOLN
INTRAVENOUS | Status: AC
  Filled 2023-07-16: qty 250

## 2023-07-16 MED ORDER — METHOCARBAMOL 500 MG PO TABS
500.0000 mg | ORAL_TABLET | Freq: Four times a day (QID) | ORAL | 0 refills | Status: DC | PRN
  Filled 2023-07-16: qty 60, 15d supply, fill #0

## 2023-07-16 MED ORDER — PROPOFOL 10 MG/ML IV BOLUS
INTRAVENOUS | Status: DC | PRN
  Administered 2023-07-16: 200 mg via INTRAVENOUS

## 2023-07-16 MED ORDER — SURGIFLO WITH THROMBIN (HEMOSTATIC MATRIX KIT) OPTIME
TOPICAL | Status: DC | PRN
  Administered 2023-07-16: 1 via TOPICAL

## 2023-07-16 MED ORDER — METHYLPREDNISOLONE ACETATE 40 MG/ML IJ SUSP
INTRAMUSCULAR | Status: DC | PRN
  Administered 2023-07-16: 40 mg

## 2023-07-16 MED ORDER — GLYCOPYRROLATE 0.2 MG/ML IJ SOLN
INTRAMUSCULAR | Status: DC | PRN
  Administered 2023-07-16: .2 mg via INTRAVENOUS

## 2023-07-16 MED ORDER — OXYCODONE HCL 5 MG PO TABS
5.0000 mg | ORAL_TABLET | ORAL | 0 refills | Status: AC | PRN
  Filled 2023-07-16: qty 20, 7d supply, fill #0

## 2023-07-16 MED ORDER — ACETAMINOPHEN 10 MG/ML IV SOLN
INTRAVENOUS | Status: DC | PRN
  Administered 2023-07-16: 1000 mg via INTRAVENOUS

## 2023-07-16 MED ORDER — FENTANYL CITRATE (PF) 100 MCG/2ML IJ SOLN: 25.0000 ug | INTRAMUSCULAR | Status: DC | PRN

## 2023-07-16 MED ORDER — PHENYLEPHRINE HCL-NACL 20-0.9 MG/250ML-% IV SOLN
INTRAVENOUS | Status: DC | PRN
Start: 2023-07-16 — End: 2023-07-16
  Administered 2023-07-16: 15 ug/min via INTRAVENOUS

## 2023-07-16 MED ORDER — METHYLPREDNISOLONE ACETATE 40 MG/ML IJ SUSP
INTRAMUSCULAR | Status: AC
  Filled 2023-07-16: qty 1

## 2023-07-16 MED ORDER — CEFAZOLIN SODIUM-DEXTROSE 2-4 GM/100ML-% IV SOLN
INTRAVENOUS | Status: AC
  Filled 2023-07-16: qty 100

## 2023-07-16 MED ORDER — REMIFENTANIL HCL 1 MG IV SOLR
INTRAVENOUS | Status: AC
  Filled 2023-07-16: qty 1000

## 2023-07-16 MED ORDER — FENTANYL CITRATE (PF) 100 MCG/2ML IJ SOLN
INTRAMUSCULAR | Status: AC
Start: 2023-07-16 — End: 2023-07-16
  Filled 2023-07-16: qty 2

## 2023-07-16 SURGICAL SUPPLY — 32 items
BASIN KIT SINGLE STR (MISCELLANEOUS) ×1 IMPLANT
BUR NEURO DRILL SOFT 3.0X3.8M (BURR) ×1 IMPLANT
DERMABOND ADVANCED .7 DNX12 (GAUZE/BANDAGES/DRESSINGS) ×1 IMPLANT
DRAPE C ARM PK CFD 31 SPINE (DRAPES) ×1 IMPLANT
DRAPE C-ARM XRAY 36X54 (DRAPES) IMPLANT
DRAPE LAPAROTOMY 100X77 ABD (DRAPES) ×1 IMPLANT
DRAPE MICROSCOPE SPINE 48X150 (DRAPES) ×1 IMPLANT
DRSG OPSITE POSTOP 3X4 (GAUZE/BANDAGES/DRESSINGS) ×1 IMPLANT
ELECTRODE EZSTD 165MM 6.5IN (MISCELLANEOUS) ×1 IMPLANT
ELECTRODE REM PT RTRN 9FT ADLT (ELECTROSURGICAL) ×1 IMPLANT
GLOVE BIOGEL PI IND STRL 6.5 (GLOVE) ×1 IMPLANT
GLOVE SURG SYN 6.5 ES PF (GLOVE) ×1 IMPLANT
GLOVE SURG SYN 6.5 PF PI (GLOVE) ×1 IMPLANT
GLOVE SURG SYN 8.5 E (GLOVE) ×3 IMPLANT
GLOVE SURG SYN 8.5 PF PI (GLOVE) ×3 IMPLANT
GOWN SRG LRG LVL 4 IMPRV REINF (GOWNS) ×1 IMPLANT
GOWN SRG XL LVL 3 NONREINFORCE (GOWNS) ×1 IMPLANT
KIT SPINAL PRONEVIEW (KITS) ×1 IMPLANT
MANIFOLD NEPTUNE II (INSTRUMENTS) ×1 IMPLANT
MARKER SKIN DUAL TIP RULER LAB (MISCELLANEOUS) ×1 IMPLANT
NDL SAFETY ECLIPSE 18X1.5 (NEEDLE) ×1 IMPLANT
NS IRRIG 500ML POUR BTL (IV SOLUTION) ×1 IMPLANT
PACK LAMINECTOMY ARMC (PACKS) ×1 IMPLANT
PAD ARMBOARD POSITIONER FOAM (MISCELLANEOUS) ×1 IMPLANT
SURGIFLO W/THROMBIN 8M KIT (HEMOSTASIS) ×1 IMPLANT
SUT STRATA 3-0 15 PS-2 (SUTURE) ×1 IMPLANT
SUT VIC AB 0 CT1 27XCR 8 STRN (SUTURE) ×1 IMPLANT
SUT VIC AB 2-0 CT1 18 (SUTURE) ×1 IMPLANT
SYR 30ML LL (SYRINGE) ×2 IMPLANT
SYR 3ML LL SCALE MARK (SYRINGE) ×1 IMPLANT
TRAP FLUID SMOKE EVACUATOR (MISCELLANEOUS) ×1 IMPLANT
WATER STERILE IRR 500ML POUR (IV SOLUTION) IMPLANT

## 2023-07-16 NOTE — Anesthesia Procedure Notes (Addendum)
 Procedure Name: Intubation Date/Time: 07/16/2023 7:20 AM  Performed by: Ledora Duncan, CRNAPre-anesthesia Checklist: Patient identified, Emergency Drugs available, Suction available and Patient being monitored Patient Re-evaluated:Patient Re-evaluated prior to induction Oxygen Delivery Method: Circle system utilized Preoxygenation: Pre-oxygenation with 100% oxygen Induction Type: IV induction Ventilation: Mask ventilation without difficulty Laryngoscope Size: McGrath and 4 Grade View: Grade I Tube type: Oral Tube size: 7.0 mm Number of attempts: 1 Airway Equipment and Method: Stylet Placement Confirmation: ETT inserted through vocal cords under direct vision, positive ETCO2 and breath sounds checked- equal and bilateral Secured at: 21 cm Tube secured with: Tape Dental Injury: Teeth and Oropharynx as per pre-operative assessment  Comments: Strict neutral position maintained throughout w/o incident TFH CRNA

## 2023-07-16 NOTE — Anesthesia Preprocedure Evaluation (Signed)
 Anesthesia Evaluation  Patient identified by MRN, date of birth, ID band Patient awake    Reviewed: Allergy & Precautions, NPO status , Patient's Chart, lab work & pertinent test results  History of Anesthesia Complications Negative for: history of anesthetic complications  Airway Mallampati: II  TM Distance: >3 FB Neck ROM: full    Dental  (+) Chipped   Pulmonary neg pulmonary ROS, neg shortness of breath   Pulmonary exam normal        Cardiovascular Exercise Tolerance: Good (-) angina (-) Past MI and (-) DOE negative cardio ROS Normal cardiovascular exam     Neuro/Psych  Headaches, neg Seizures PSYCHIATRIC DISORDERS         GI/Hepatic Neg liver ROS,GERD  Controlled,,  Endo/Other  negative endocrine ROSdiabetes    Renal/GU      Musculoskeletal   Abdominal   Peds  Hematology negative hematology ROS (+)   Anesthesia Other Findings Past Medical History: No date: Depression No date: Frequency of urination     Comment:  since radiation tx No date: GERD (gastroesophageal reflux disease) No date: Headache No date: History of traumatic head injury     Comment:  age 68--  head injury w/ fractured skull in coma for 3               days made full recovery withou further treatment--  no               residual No date: Nocturia urologist-  dr ottelin/  oncologist- dr patrcia: Prostate cancer [facility])     Comment:  T1c, Gleason 4+4,  PSA 14.24,  vol 25.6cc/  IM radiation              therapy (05-23-2015 to 06-27-2015)   05-23-2015  to  06-27-2015: S/P radiation therapy     Comment:  prostatic fossa  68.4 Gy in 38 fractions in 1.8 Gy 07/29/2020: Subdural hematoma (HCC) No date: Type 2 diabetes, diet controlled (HCC) No date: Ulcer No date: Wears glasses  Past Surgical History: 09/13/2020: ANTERIOR CERVICAL DECOMP/DISCECTOMY FUSION; N/A     Comment:  Procedure: C5-6 ANTERIOR CERVICAL                DECOMPRESSION/DISCECTOMY FUSION 1 LEVEL;  Surgeon:               Clois Fret, MD;  Location: [facility] ORS;  Service:               Neurosurgery;  Laterality: N/A; 01/21/1973: APPENDECTOMY 2012 approx: COLONOSCOPY 01/29/2022: COLONOSCOPY; N/A     Comment:  Procedure: COLONOSCOPY;  Surgeon: Maryruth Ole DASEN,               MD;  Location: [facility] ENDOSCOPY;  Service:               Gastroenterology;  Laterality: N/A; 04/29/2016: COLONOSCOPY WITH PROPOFOL ; N/A     Comment:  Procedure: COLONOSCOPY WITH PROPOFOL ;  Surgeon: Lamar DASEN Holmes, MD;  Location: [facility] ENDOSCOPY;  Service:               Endoscopy;  Laterality: N/A; 07/29/2020: CRANIOTOMY; Right     Comment:  Procedure: CRANIOTOMY HEMATOMA EVACUATION SUBDURAL;                Surgeon: Clois Fret, MD;  Location: [facility] ORS;                Service:  Neurosurgery;  Laterality: Right; 07/18/2015: CYSTOSCOPY; N/A     Comment:  Procedure: CYSTOSCOPY FLEXIBLE;  Surgeon: Mark Ottelin,               MD;  Location: [facility];  Service:               Urology;  Laterality: N/A;  no seeds found in bladder 02/05/2021: ESOPHAGOGASTRODUODENOSCOPY (EGD) WITH PROPOFOL ; N/A     Comment:  Procedure: ESOPHAGOGASTRODUODENOSCOPY (EGD) WITH               PROPOFOL ;  Surgeon: Therisa Bi, MD;  Location: [facility]               ENDOSCOPY;  Service: Gastroenterology;  Laterality: N/A; No date: PROSTATE BIOPSY 07/18/2015: RADIOACTIVE SEED IMPLANT; N/A     Comment:  Procedure: RADIOACTIVE SEED IMPLANT/BRACHYTHERAPY               IMPLANT;  Surgeon: Mark Ottelin, MD;  Location: [facility]               [place];  Service: Urology;  Laterality: N/A;              48  seeds implanted 07/29/2020: SUBDURAL HEMATOMA EVACUATION VIA CRANIOTOMY; Right     Reproductive/Obstetrics negative OB ROS                             Anesthesia Physical Anesthesia Plan  ASA: 2  Anesthesia Plan: General ETT   Post-op  Pain Management:    Induction: Intravenous  PONV Risk Score and Plan: Ondansetron , Dexamethasone , Midazolam  and Treatment may vary due to age or medical condition  Airway Management Planned: Oral ETT  Additional Equipment:   Intra-op Plan:   Post-operative Plan: Extubation in OR  Informed Consent: I have reviewed the patients History and Physical, chart, labs and discussed the procedure including the risks, benefits and alternatives for the proposed anesthesia with the patient or authorized representative who has indicated his/her understanding and acceptance.     Dental Advisory Given  Plan Discussed with: Anesthesiologist, CRNA and Surgeon  Anesthesia Plan Comments: (Patient consented for risks of anesthesia including but not limited to:  - adverse reactions to medications - damage to eyes, teeth, lips or other oral mucosa - nerve damage due to positioning  - sore throat or hoarseness - Damage to heart, brain, nerves, lungs, other parts of body or loss of life  Patient voiced understanding and assent.)       Anesthesia Quick Evaluation

## 2023-07-16 NOTE — Discharge Instructions (Signed)

## 2023-07-16 NOTE — Op Note (Signed)
 Indications: [name] is suffering from M54.16 Lumbar radiculopathy, R29.898 Left leg weakness. The patient tried and failed conservative management, prompting surgical intervention.  Findings: disc herniation  Preoperative Diagnosis: M54.16 Lumbar radiculopathy, R29.898 Left leg weakness  Postoperative Diagnosis: same   EBL: 10 ml IVF: see anesthesia record Drains: none Disposition: Extubated and Stable to PACU Complications: none  No foley catheter was placed.   Preoperative Note:   Risks of surgery discussed include: infection, bleeding, stroke, coma, death, paralysis, CSF leak, nerve/spinal cord injury, numbness, tingling, weakness, complex regional pain syndrome, recurrent stenosis and/or disc herniation, vascular injury, development of instability, neck/back pain, need for further surgery, persistent symptoms, development of deformity, and the risks of anesthesia. The patient understood these risks and agreed to proceed.  Operative Note:   1) Left L4/5 microdiscectomy  The patient was then brought from the preoperative center with intravenous access established.  The patient underwent general anesthesia and endotracheal tube intubation, and was then rotated on the [place]mon rail top where all pressure points were appropriately padded.  The skin was then thoroughly cleansed.  Perioperative antibiotic prophylaxis was administered.  Sterile prep and drapes were then applied and a timeout was then observed.  C-arm was brought into the field under sterile conditions, and the L4-5 disc space identified and marked with an incision on the left 1cm lateral to midline.  Once this was complete a 3 cm incision was opened with the use of a #10 blade knife.  The Metrx tubes were sequentially advanced under lateral fluoroscopy until a 18 x 60 mm Metrx tube was placed over the facet and lamina and secured to the bed.    The microscope was then sterilely brought into the field and muscle  creep was hemostased with a bipolar and resected with a pituitary rongeur.  A Bovie extender was then used to expose the spinous process and lamina.  Careful attention was placed to not violate the facet capsule. A 3 mm matchstick drill bit was then used to make a hemi-laminotomy trough until the ligamentum flavum was exposed.  This was extended to the base of the spinous process.  Once this was complete and the underlying ligamentum flavum was visualized, the ligamentum was dissected with an up angle curette and resected with a #2 and #3 mm biting Kerrison.  The laminotomy opening was also expanded in similar fashion and hemostasis was obtained with Surgifoam and a patty as well as bone wax.  The rostral aspect of the caudal level of the lamina was also resected with a #2 biting Kerrison effort to further enhance exposure.  Once the underlying dura was visualized a Penfield 4 was then used to dissect and expose the traversing nerve root.  Once this was identified a nerve root retractor suction was used to mobilize this medially.  The venous plexus was hemostased with Surgifoam and light bipolar use.  A small penfied was then used to make a small annulotomy within the disc space and disc space contents were noted to come through the annulus.    The disc herniation was identified and dissected free using a balltip probe. The pituitary rongeur was used to remove the extruded disc fragments. Once the thecal sac and nerve root were noted to be relaxed and under less tension the ball-tipped feeler was passed along the foramen distally to ensure no residual compression was noted.    Depo-Medrol was placed along the nerve root.  The area was irrigated. The tube system was then  removed under microscopic visualization and hemostasis was obtained with a bipolar.    The fascial layer was reapproximated with the use of a 0- Vicryl suture.  Subcutaneous tissue layer was reapproximated using 2-0 Vicryl suture.  3-0 monocryl  was used on the skin. The skin was then cleansed and Dermabond was used to close the skin opening.  Patient was then rotated back to the preoperative bed awakened from anesthesia and taken to recovery all counts are correct in this case.   I performed the entire procedure.   Charles Daisy MD

## 2023-07-16 NOTE — Interval H&P Note (Signed)
 History and Physical Interval Note:  07/16/2023 6:50 AM  Charles Marks  has presented today for surgery, with the diagnosis of M54.16 Lumbar radiculopathy R29.898 Left leg weakness.  The various methods of treatment have been discussed with the patient and family. After consideration of risks, benefits and other options for treatment, the patient has consented to  Procedure(s) with comments: LUMBAR LAMINECTOMY/DECOMPRESSION MICRODISCECTOMY 1 LEVEL (Left) - LEFT L4-5 MICRODISCECTOMY as a surgical intervention.  The patient's history has been reviewed, patient examined, no change in status, stable for surgery.  I have reviewed the patient's chart and labs.  Questions were answered to the patient's satisfaction.    Heart sounds normal no MRG. Chest Clear to Auscultation Bilaterally.   Dorota Heinrichs

## 2023-07-16 NOTE — Transfer of Care (Signed)
 Immediate Anesthesia Transfer of Care Note  Patient: Charles Marks  Procedure(s) Performed: LEFT L4-5 MICRODISCECTOMY (Left: Spine Lumbar)  Patient Location: PACU  Anesthesia Type:General  Level of Consciousness: drowsy and patient cooperative  Airway & Oxygen Therapy: Patient Spontanous Breathing and Patient connected to face mask oxygen  Post-op Assessment: Report given to RN and Post -op Vital signs reviewed and stable  Post vital signs: Reviewed and stable  Last Vitals:  Vitals Value Taken Time  BP 130/41 07/16/23 08:46  Temp 36.5 C 07/16/23 08:46  Pulse 56 07/16/23 08:51  Resp 13 07/16/23 08:51  SpO2 99 % 07/16/23 08:51  Vitals shown include unfiled device data.  Last Pain:  Vitals:   07/16/23 0846  TempSrc:   PainSc: Asleep         Complications: No notable events documented.

## 2023-07-16 NOTE — Discharge Summary (Signed)
 Physician Discharge Summary  Patient ID: Charles Marks MRN: [id number] DOB/AGE: 1955-04-08 68 y.o.  Admit date: 07/16/2023 Discharge date: 07/16/2023  Admission Diagnoses:Active Problems:   Lumbar radiculopathy   Left leg weakness  Discharge Diagnoses:  Active Problems:   Lumbar radiculopathy   Left leg weakness   Discharged Condition: good  Hospital Course: Charles Marks was brought to the operating for surgical intervention.  He had presented with left leg weakness. He tolerated the procedure well and met criteria for discharge.  Consults: None  Significant Diagnostic Studies: radiology: X-Ray: localization  Treatments: surgery: L L4/5 microdiscectomy  Discharge Exam: Blood pressure (!) 134/50, pulse 61, temperature 97.7 F (36.5 C), resp. rate 18, SpO2 100%. General appearance: alert and cooperative MAEW  Disposition: Discharge disposition: 01-Home or Self Care       Discharge Instructions     Discharge patient   Complete by: As directed    Discharge disposition: 01-Home or Self Care   Discharge patient date: 07/16/2023      Allergies as of 07/16/2023   No Known Allergies      Medication List     STOP taking these medications    HYDROcodone -acetaminophen  5-325 MG tablet Commonly known as: NORCO/VICODIN   traMADol 50 MG tablet Commonly known as: ULTRAM       TAKE these medications    AIMOVIG (140 MG DOSE) [place] Inject 1 Dose into the skin every 30 (thirty) days.   Aimovig 140 MG/ML Soaj Generic drug: Erenumab-aooe Inject 140 mg into the skin every 30 (thirty) days.   CALCIUM-VITAMIN D PO Take 1 tablet by mouth daily.   divalproex 125 MG DR tablet Commonly known as: DEPAKOTE Take 250 mg by mouth 2 (two) times daily.   gabapentin 400 MG capsule Commonly known as: NEURONTIN Take 400-800 mg by mouth See admin instructions. Take 400 in the morning and 800 mg at bedtime   ibuprofen 800 MG tablet Commonly known as: ADVIL Take 1 tablet (800  mg total) by mouth 3 (three) times daily.   methocarbamol  500 MG tablet Commonly known as: ROBAXIN  Take 1 tablet (500 mg total) by mouth every 6 (six) hours as needed for muscle spasms.   Multi-Vitamins Tabs Take 1 tablet by mouth daily.   omeprazole 20 MG capsule Commonly known as: PRILOSEC Take 20 mg by mouth daily.   oxyCODONE  5 MG immediate release tablet Commonly known as: Roxicodone  Take 1 tablet (5 mg total) by mouth every 4 (four) hours as needed for up to 7 days for moderate pain (pain score 4-6).   QUEtiapine 200 MG tablet Commonly known as: SEROQUEL Take 200 mg by mouth at bedtime.   QUEtiapine 100 MG tablet Commonly known as: SEROQUEL Take 100 mg by mouth every evening.   sertraline 100 MG tablet Commonly known as: ZOLOFT Take 100 mg by mouth in the morning and at bedtime.   SUMAtriptan  100 MG tablet Commonly known as: IMITREX  Take 100 mg by mouth every 2 (two) hours as needed for migraine or headache.   SUMAtriptan  Succinate Refill 6 MG/0.5ML Soct INJECT 0.5 ML (6MG ) SUBCUTANEOUSLY AS DIRECTED FOR MIGRAINE. MAY REPEAT IN 1 HOUR IF NEEDED. MAXIMUM OF 2 DOSES (12MG ) IN 24-48 HOURS.   verapamil 120 MG tablet Commonly known as: CALAN Take 120 mg by mouth daily.         SignedBETHA REEVES DAISY 07/16/2023, 9:07 AM

## 2023-07-16 NOTE — Anesthesia Postprocedure Evaluation (Signed)
 Anesthesia Post Note  Patient: Charles Marks  Procedure(s) Performed: LEFT L4-5 MICRODISCECTOMY (Left: Spine Lumbar)  Patient location during evaluation: PACU Anesthesia Type: General Level of consciousness: awake and alert Pain management: pain level controlled Vital Signs Assessment: post-procedure vital signs reviewed and stable Respiratory status: spontaneous breathing, nonlabored ventilation and respiratory function stable Cardiovascular status: blood pressure returned to baseline and stable Postop Assessment: no apparent nausea or vomiting Anesthetic complications: no   No notable events documented.   Last Vitals:  Vitals:   07/16/23 0945 07/16/23 1023  BP: (!) 116/54 (!) 117/58  Pulse: 69   Resp: 18   Temp: (!) 36.3 C   SpO2: 99%     Last Pain:  Vitals:   07/16/23 0945  TempSrc: Temporal  PainSc: 1                  Fairy POUR Peirce Deveney

## 2023-07-17 ENCOUNTER — Encounter: Payer: Self-pay | Admitting: Neurosurgery

## 2023-07-31 ENCOUNTER — Encounter: Payer: Self-pay | Admitting: Neurosurgery

## 2023-07-31 ENCOUNTER — Ambulatory Visit: Admitting: Neurosurgery

## 2023-07-31 VITALS — BP 110/66 | Temp 97.8°F | Ht 75.0 in | Wt 199.0 lb

## 2023-07-31 DIAGNOSIS — Z9889 Other specified postprocedural states: Secondary | ICD-10-CM

## 2023-07-31 DIAGNOSIS — Z09 Encounter for follow-up examination after completed treatment for conditions other than malignant neoplasm: Secondary | ICD-10-CM

## 2023-07-31 DIAGNOSIS — R29898 Other symptoms and signs involving the musculoskeletal system: Secondary | ICD-10-CM

## 2023-07-31 DIAGNOSIS — M5416 Radiculopathy, lumbar region: Secondary | ICD-10-CM

## 2023-07-31 NOTE — Progress Notes (Signed)
   REFERRING PHYSICIAN:  Valora Agent, Md [address] [place],  [place] [address]  DOS: 07/16/23 left L4-5 microdiscectomy.   HISTORY OF PRESENT ILLNESS: Charles Marks is approximately 2 weeks status post microdiscectomy. he is doing well and reports resolution of his pre-op symptoms. He does however have some persistent foot weakness on the left that is making ambulating prolonged distances rather challenging  PHYSICAL EXAMINATION:  General: Patient is well developed, well nourished, calm, collected, and in no apparent distress.   NEUROLOGICAL:  General: In no acute distress.   Awake, alert, oriented to person, place, and time.  Pupils equal round and reactive to light.  Facial tone is symmetric.  Tongue protrusion is midline.  There is no pronator drift.   Strength:             Side Iliopsoas Quads Hamstring PF DF EHL  R 5 5 5 5 5 5   L 5 5 5 5  4- 4-   Incision c/d/i   ROS (Neurologic):  Negative except as noted above  IMAGING: No interval imaging to review  ASSESSMENT/PLAN:  Charles Marks is doing well approximately 2 weeks after microdiscectomy. Given his ongoing foot weakness, I recommended further evaluation and treatment by physical therapy.  He has been a patient at [facility] clinic in the past and would like to return there.  I have placed a referral for this.  We briefly discussed potentially getting an AFO, however I would like Pts input prior to ordering this. We discussed activity escalation and I have advised the patient to lift up to 10 pounds until 6 weeks after surgery, then increase up to 25 pounds until 12 weeks after surgery.  After 12 weeks post-op, the patient advised to increase activity as tolerated.  he will follow up with Dr. Clois in 4 weeks or sooner should he have any questions or concerns. He expressed understanding and was in agreement with this plan.   Edsel Goods PA-C Department of neurosurgery

## 2023-08-08 ENCOUNTER — Other Ambulatory Visit (HOSPITAL_BASED_OUTPATIENT_CLINIC_OR_DEPARTMENT_OTHER): Payer: Self-pay

## 2023-08-28 ENCOUNTER — Ambulatory Visit (INDEPENDENT_AMBULATORY_CARE_PROVIDER_SITE_OTHER): Admitting: Neurosurgery

## 2023-08-28 ENCOUNTER — Encounter: Payer: Self-pay | Admitting: Neurosurgery

## 2023-08-28 VITALS — BP 132/84 | HR 98 | Ht 75.0 in | Wt 195.5 lb

## 2023-08-28 DIAGNOSIS — Z09 Encounter for follow-up examination after completed treatment for conditions other than malignant neoplasm: Secondary | ICD-10-CM

## 2023-08-28 DIAGNOSIS — M5416 Radiculopathy, lumbar region: Secondary | ICD-10-CM

## 2023-08-28 DIAGNOSIS — R29898 Other symptoms and signs involving the musculoskeletal system: Secondary | ICD-10-CM

## 2023-08-28 DIAGNOSIS — Z9889 Other specified postprocedural states: Secondary | ICD-10-CM

## 2023-08-28 NOTE — Progress Notes (Signed)
   REFERRING PHYSICIAN:  Valora Agent, Md [address] [place],  [place] [address]  DOS: 07/16/23 left L4-5 microdiscectomy.   HISTORY OF PRESENT ILLNESS: Prashant Glosser is status post microdiscectomy. he is doing well and reports improvement of his pre-op symptoms. His strength is improving.   PHYSICAL EXAMINATION:  General: Patient is well developed, well nourished, calm, collected, and in no apparent distress.   NEUROLOGICAL:  General: In no acute distress.   Awake, alert, oriented to person, place, and time.  Pupils equal round and reactive to light.  Facial tone is symmetric.  Tongue protrusion is midline.  There is no pronator drift.   Strength:             Side Iliopsoas Quads Hamstring PF DF EHL  R 5 5 5 5 5 5   L 5 5 5 5  4+ 4+   Incision c/d/i   ROS (Neurologic):  Negative except as noted above  IMAGING: No interval imaging to review  ASSESSMENT/PLAN:  Elijahjames Fuelling is doing well after discectomy.  We reviewed his activity limitations.  He will continue exercises.  Will see him back in 6 weeks.    Reeves Daisy MD Department of neurosurgery

## 2023-09-03 NOTE — Progress Notes (Signed)
 Today the history is gathered from: 100% - patient  0% - alone  REFERRING PHYSICIAN: Valora Lynwood FALCON, MD PRIMARY CARE PHYSICIAN:  Valora Lynwood FALCON, MD  IMPRESSION/PLAN  Mr. Doro is a 68 y.o. male presenting for evaluation of  HEADACHE/ SLEEP DIFFICULTY/ SEIZURE/ FALLS / RIGHT ARM PAIN/ ANXIETY - Ongoing. - Patient with headaches at right temporal region occurring every 1-1 months in 3-5 day cycles. Reports aura prior to headaches. Headaches last 1 hour and will take oral Imitrex , is effective. No phonophobia, photophobia, nausea, vomiting, dizziness. No recent seizure episodes, last known seizure was in 2022. Daily moderate anxiety. Sleep is overall well. - Continue Aimovig monthly injections for headache management. Refill.  - Continue Depakote 250 mg twice daily. Refill.  - Continue Gabapentin 400 mg in the morning and 800 mg nightly. Refill. - Continue Seroquel 100 mg at dinner and 200 mg at night. Refill. - Continue Verapamil SR 120 mg daily. Refill. - Continue Imitrex  6mg /0.5 mL injections for headache rescue. Refill. - Continue Imitrex  100 mg for headache rescue. Take half a tab to one tab at headache onset. Repeat once in 2 hours if needed. Take no more than 2 tabs in a 24 hour period. Refill.  Follow up with Dr. Lane in 6-12 months.  Medication Previously tried: Clonazepam (Not effective)  CHIEF COMPLAIN & HISTORY OF PRESENT ILLNESS:  Mr. Hickox is a 68 y.o. male presenting for evaluation of: Chief Complaint  Patient presents with  . HEADACHE/ SLEEP DIFFICULTY/ SEIZURE/ FALLS    HEADACHE/ SLEEP DIFFICULTY/ SEIZURE/ FALLS / RIGHT ARM PAIN/ ANXIETY Patient with headaches at right temporal and frontal region occurring every 1-2 months in 3-5 day cycles. States feeling a twinge of pain prior to headache onset. Headaches will last 1 hour and will take oral Imitrex  that resolves symptoms. No phonophobia, photophobia, nausea, vomiting, dizziness. No OTC medication use for  headaches. No recent seizure episodes, last known seizure was in 2022. Sleep is overall well. Daily moderate anxiety with racing thoughts. No recent falls. Taking Aimovig monthly injections, Depakote 250 mg BID, Gabapentin 400 mg in the morning and 800 mg nightly, Seroqeul 100 mg at dinner and 200 mg at night, Verapamil SR 120 mg daily, Imitrex  100 mg PRN, Imitrex  injector kit PRN.   DATA SUMMARY: 09/24/2022 MR CSPINE WO CONTRAST IMPRESSION:  1. Previous ACDF at C5-6 with apparent sufficient patency of the  canal and foramina.  2. C6-7: Spondylosis more pronounced towards the left with endplate  osteophytes and bulging of the disc. Narrowing of the ventral  subarachnoid space. AP diameter of the canal in the midline 8.4 mm.  Slight indentation of the cord. Foraminal stenosis on the left that  could affect the C7 nerve. Findings at this level have worsened  slightly since 2022.  3. C4-5: Endplate osteophytes and bulging of the disc. Bilateral  foraminal stenosis that could affect either C5 nerve. This appears  more pronounced on the left.  4. C3-4: Endplate osteophytes with uncovertebral hypertrophy right  more than left. Mild facet osteoarthritis on the right. Foraminal  stenosis right worse than left. The right C4 nerve could be  affected.   03/16/2021 ROUTINE EEG IMPRESSION: This routine EEG in the awake and asleep states is within normal limits.  10/05/2020 MRI CERVICAL SPINE WO CONTRAST  IMPRESSION:  1. Unchanged left foraminal disc protrusion at C6-7 with moderate  left neural foraminal stenosis.  2. Unchanged moderate right C3-4 neural foraminal stenosis.  3. C5-6 ACDF with improved patency  of the spinal canal.   CT BRAIN WO CONTRAST  08/22/2020 IMPRESSION:  Unchanged 7 mm right frontal mixed density subdural hematoma with stable 3 mm right to left midline shift. No new hemorrhage.  CT HEAD HEAD AND NECK W WO 07/29/2020 IMPRESSION:  1. Irregular linear filling defect  involving the petrous/proximal  cavernous right ICA, suspicious for dissection, age indeterminate.  No frank intraluminal thrombus, significant stenosis, or  pseudoaneurysm.  2. Otherwise wide patency of the major arterial vasculature of the  head and neck. No large vessel occlusion. No other hemodynamically  significant or correctable stenosis. No intracranial aneurysm.  3. Tortuosity with multifocal irregularity involving the mid right  ICA, suggestive of possible underlying FMD.  4. 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 Correlation with  physical exam suggested.    CT HEAD WO/ CT CERVICAL SPINE WO IMPRESSION:  1. Mixed density holo hemispheric subdural hematoma overlying the  right cerebral hemisphere, measuring up to 2 cm in thickness.  Associated mass effect with up to 1.3 cm of right-to-left midline  shift. No hydrocephalus or ventricular trapping at this time.  2. Small volume extra-axial hemorrhage overlying the anterior left  cerebral convexity, measuring up to 7 mm in thickness without  significant mass effect.  3. Scattered subarachnoid hemorrhage as above, most pronounced at  the right frontotemporal region/right sylvian fissure.  4. Acute nondisplaced right temporal bone fracture with associated  right mastoid and middle ear effusion.  5. Acute nondisplaced, nondepressed left and right occipital skull  fractures.    05/24/2020 CAROTID IMPRESSION:  Very minimal amount of bilateral intimal thickening, not resulting  in a hemodynamically significant stenosis within either internal  carotid artery.    05/24/2019 MRI BRAIN IMPRESSION:  Normal aging brain.     VISIT SUMMARIES:  10/02/20: Patient with headaches, and sleep difficulty, improving. Continue Verapamil 120 mg extended release tablet.  Continue seroquel 300 mg nightly, refilled. Continue Keppra  1000 mg twice daily, refilled.  Continue Gabapentin 600 mg in the morning, 600 mg in the afternoon, and  800 mg at night.  12/28/2019: Patient with headaches and sleep difficulty, ongoing. Start Verapamil 75 mg twice daily for a week, then increase to 3 times per day. Continue Zonisamide 100 mg nightly, refilled. Continue Seroquel 200 mg nightly, refilled. Continue Aimovig monthly injections for headache prevention, refilled. Continue gabapentin 100 mg in the morning and 200 mg at night, refilled. Continue Imitrex  100 mg as needed at headache onset, refilled.   06/22/19: Patient with headaches and sleep difficulty, improving. Continue Zofran  100 mg nightly, Seroquel 200 mg nightly, Aimovig monthly injections for headache prevention, gabapentin 100 mg in the morning and 200 mg at night, and Imitrex  100 mg as needed at headache onset. Will consider clonazepam 0.25 mg twice daily, nortriptyline, SPG block, or Botox if headaches persist.   05/18/19: Patient with headaches and sleep difficulty, worse. Start Zonegran 100 mg nightly for headache prevention. Continue Seroquel 200 mg nightly, Aimovig monthly injections for headache prevention, gabapentin 100 mg in the morning and 200 mg at night, and Imitrex  100 mg as needed at headache onset. Schedule brain MRI without contrast to evaluate for structural abnormalities. Will consider clonazepam, SPG block or botox if headaches persist.   11/16/18: Patient with headaches, improving. Taking Aimovig monthly injectable, refilled. Taking  Seroquel 200 mg at night, refilled.  Increase Gabapentin to 300 mg in the morning and 600 mg in the evening. Taking Imitrex  100 mg tabs and Imitrex  IM injections as needed for  headache rescue, refilled.   10/05/18: Patient with headaches, worse. Taking Amovig 140 mg, refilled. Start taking Gabapentin 100 mg twice per day for one week, then increase to 200 mg twice per day for a week, then increase to 300 mg twice per day. Taking Imitrex  100 mg as a rescue medication at headache onset, refilled. Taking Imitrex  IM injections as a rescue  medication at headache onset, refilled. Increase Seroquel to 200 mg nightly.   05/26/18: Headaches worse. Continue taking Imitrex  100 mg tabs, Imitrex  IM injections, and Aimovig monthly injections. Will consider adding low dose nortriptyline if headaches do not improve.   07/22/16: Patient with headaches. Recommend nortriptyline 10 mg at night for 1 week, then increase to 20 mg for 1 week, then increase to 30 mg and continue.-Will consider medrol  dose pack to break the cycle of headaches. Continue to take Imitrex  as needed for rescue.Recommend to stop taking Excedrin to avoid rebound headaches.    MEDICATIONS Current Outpatient Medications  Medication Sig Dispense Refill  . divalproex (DEPAKOTE) 125 MG DR tablet TAKE 2 TABLETS BY MOUTH TWICE A DAY 360 tablet 1  . erenumab-aooe (AIMOVIG AUTOINJECTOR) 140 mg/mL AtIn Inject 1 mL subcutaneously monthly 1 mL 3  . gabapentin (NEURONTIN) 400 MG capsule TAKE 1 CAPSULE BY MOUTH EVERY MORNING AND TAKE TWO CAPSULES BY MOUTH EVERY EVENING 90 capsule 2  . omeprazole (PRILOSEC) 20 MG DR capsule Take 1 capsule (20 mg total) by mouth once daily 90 capsule 3  . QUEtiapine (SEROQUEL) 100 MG tablet TAKE ONE TABLET BY MOUTH EVERY EVENING WITH 200 MG TABLET 30 tablet 5  . QUEtiapine (SEROQUEL) 200 MG tablet TAKE 1 TABLET BY MOUTH EVERY NIGHT AT BEDTIME 30 tablet 5  . sertraline (ZOLOFT) 100 MG tablet Take 1 tablet (100 mg total) by mouth 2 (two) times daily 180 tablet 3  . SUMAtriptan  (IMITREX  STATDOSE) 6 mg/0.5 mL pen injector kit INJECT 0.5 ML (6MG ) SUBCUTANEOUSLY AS DIRECTED FOR MIGRAINE. MAY REPEAT IN 1 HOUR IF NEEDED. MAXIMUM OF 2 DOSES (12MG ) IN 24-48 HOURS. 1 mL 1  . SUMAtriptan  (IMITREX ) 100 MG tablet Take 50-100 mg tablets for headaches rescue. May repeat once in 2 hours if needed. Take no more than 2 tabs in a 24 hours period. 9 tablet 3  . verapamiL (CALAN-SR) 120 MG SR tablet TAKE 1 TABLET BY MOUTH DAILY 90 tablet 0  . HYDROcodone -acetaminophen  (NORCO)  5-325 mg tablet Take 1 tablet by mouth 2 (two) times daily as needed (Patient not taking: Reported on 09/03/2023) 10 tablet 0  . predniSONE (DELTASONE) 10 MG tablet 6 day taper - Take as directed (Patient not taking: Reported on 09/03/2023) 21 tablet 0   No current facility-administered medications for this visit.    ALLERGIES No Known Allergies   EXAM   Vitals:   09/03/23 1010  Weight: 88.5 kg (195 lb)  Height: 189.4 cm (6' 2.57)  PainSc:   8  PainLoc: Generalized      Body mass index is 24.66 kg/m.   We were not able to do thorough physical exam during this televisit. Neurological exam is a crucial part of patient evaluation and lack of it can lead to misdiagnosis or missed diagnosis. If patient has concerns, they should consider making in person appointment with the provider. Provider should not be liable for consequences of lack of in person exam.   GENERAL: Pleasant male, in nad. Normocephalic and atraumatic.   MUSCULOSKELETAL: Bulk - Normal Tone - Normal Pronator Drift - Absent bilaterally.  Ambulation - Gait and station is steady Romberg - deferred  R/L 5/5    Shoulder abduction (deltoid/supraspinatus, axillary/suprascapular n, C5) 5/5    Elbow flexion (biceps brachii, musculoskeletal n, C5-6) 5/5    Elbow extension (triceps, radial n, C7) 5/5    Finger adduction (interossei, ulnar n, T1)  5/5    Hip flexion (iliopsoas, L1/L2) 5/5    Knee flexion (hamstrings, sciatic n, L5/S1)  5/5    Knee extension (quadriceps, femoral n, L3/4) 5/5    Ankle dorsiflexion (tibialis anterior, deep fibular n, L4/5) 5/5    Ankle plantarflexion (gastroc, tibial n, S1)   NEUROLOGICAL: MENTAL STATUS: Patient is oriented to person, place and time.   Short-term memory is intact Long-term memory is intact.   Attention span and concentration are intact.   Naming and repetition are intact. Comprehension is intact.   Expressive speech is intact.   Patient's fund of knowledge is  within normal limits for educational level.  CRANIAL NERVES: Visual acuity and visual fields are intact         Extraocular muscles are intact                        Facial sensation is intact bilaterally                Facial strength is intact bilaterally                   Hearing is intact bilaterally                              Palate elevates midline, normal phonation     Shoulder shrug strength is intact                    Tongue protrudes midline                       SENSATION: Pain and temperature (spinothalamic tracts) is normal. Position and vibration (dorsal columns) is normal.  REFLEXES: R/L 2+/2+    Biceps 2+/2+    Brachioradialis  2+/2+    Patellar 2+/2+    Achilles  COORDINATION/CEREBELLAR: Finger to nose testing is deferred     PAST MEDICAL HISTORY Past Medical History:  Diagnosis Date  . Depression   . GERD (gastroesophageal reflux disease)   . Migraines   . Subdural hematoma (CMS/HHS-HCC) 07/29/2020  . Ulcer     PAST SURGICAL HISTORY Past Surgical History:  Procedure Laterality Date  . APPENDECTOMY  1976  . COLONOSCOPY N/A 04/29/2016   Dr. FABIENE Holmes @ [facility] - Int. Hemorrhoids, PHPolyps: CBF 04/2021  . right craniotomy for subdural hematoma evacuation  07/29/2020   Dr Reeves Daisy at [facility]  . C5-6 anterior cervical discectomy and fusion  09/13/2020   Dr Reeves Daisy at [facility], [place]  . Colon @ [facility]  01/29/2022   Three small Ta's. Repeat colonoscopy in 3 years/CTL  . LEFT L4-5 MICRODISCECTOMY  07/15/2023  . ENDOSCOPY BILE DUCT    . INSERTION PROSTATE RADIATION SEED      FAMILY HISTORY Family History  Problem Relation Name Age of Onset  . Brain cancer Mother    . Multiple sclerosis Mother    . Lung cancer Father    . Lung cancer Sister    . Diabetes type II Sister      SOCIAL HISTORY  Social History  Tobacco Use  . Smoking status: Never    Passive exposure: Never  . Smokeless tobacco: Never  Vaping Use  . Vaping status:  Never Used  Substance Use Topics  . Alcohol use: Yes    Comment: socially  . Drug use: No     REVIEW OF SYSTEMS:  13 system ROS form was given to the patient to complete and I have reviewed it.  The form was sent for scan to the patient's EHR.  Pertinent positives and negatives are mentioned above in the HPI and all other systems are negative.   DATA  I have personally reviewed all of the data outlined below both prior to the appointment and during the appointment with the patient as appropriate.  Office Visit on 07/08/2023  Component Date Value Ref Range Status  . WBC (White Blood Cell Count) 07/08/2023 5.4  4.1 - 10.2 10^3/uL Final  . RBC (Red Blood Cell Count) 07/08/2023 4.91  4.69 - 6.13 10^6/uL Final  . Hemoglobin 07/08/2023 15.9  14.1 - 18.1 gm/dL Final  . Hematocrit 93/82/7974 46.4  40.0 - 52.0 % Final  . MCV (Mean Corpuscular Volume) 07/08/2023 94.5  80.0 - 100.0 fl Final  . MCH (Mean Corpuscular Hemoglobin) 07/08/2023 32.4 (H)  27.0 - 31.2 pg Final  . MCHC (Mean Corpuscular Hemoglobin * 07/08/2023 34.3  32.0 - 36.0 gm/dL Final  . Platelet Count 07/08/2023 177  150 - 450 10^3/uL Final  . RDW-CV (Red Cell Distribution Widt* 07/08/2023 12.5  11.6 - 14.8 % Final  . MPV (Mean Platelet Volume) 07/08/2023 9.3 (L)  9.4 - 12.4 fl Final  . Neutrophils 07/08/2023 4.00  1.50 - 7.80 10^3/uL Final  . Lymphocytes 07/08/2023 1.00  1.00 - 3.60 10^3/uL Final  . Mixed Count 07/08/2023 0.40  0.10 - 0.90 10^3/uL Final  . Neutrophil % 07/08/2023 75.5 (H)  32.0 - 70.0 % Final  . Lymphocyte % 07/08/2023 17.6  10.0 - 50.0 % Final  . Mixed % 07/08/2023 6.9  3.0 - 14.4 % Final  . Glucose 07/08/2023 91  70 - 110 mg/dL Final  . Sodium 93/82/7974 141  136 - 145 mmol/L Final  . Potassium 07/08/2023 4.3  3.6 - 5.1 mmol/L Final  . Chloride 07/08/2023 104  97 - 109 mmol/L Final  . Carbon Dioxide (CO2) 07/08/2023 31.2  22.0 - 32.0 mmol/L Final  . Urea Nitrogen (BUN) 07/08/2023 21  7 - 25 mg/dL Final  .  Creatinine 93/82/7974 0.8  0.7 - 1.3 mg/dL Final  . Glomerular Filtration Rate (eGFR) 07/08/2023 96  >60 mL/min/1.73sq m Final  . Calcium 07/08/2023 9.5  8.7 - 10.3 mg/dL Final  . AST  93/82/7974 21  8 - 39 U/L Final  . ALT  07/08/2023 21  6 - 57 U/L Final  . Alk Phos (alkaline Phosphatase) 07/08/2023 33 (L)  34 - 104 U/L Final  . Albumin 07/08/2023 5.0 (H)  3.5 - 4.8 g/dL Final  . Bilirubin, Total 07/08/2023 0.6  0.3 - 1.2 mg/dL Final  . Protein, Total 07/08/2023 6.6  6.1 - 7.9 g/dL Final  . A/G Ratio 93/82/7974 3.1  1.0 - 5.0 gm/dL Final  . Color 93/82/7974 Yellow  Colorless, Straw, Light Yellow, Yellow, Dark Yellow Final  . Clarity 07/08/2023 Clear  Clear Final  . Specific Gravity 07/08/2023 1.024  1.005 - 1.030 Final  . pH, Urine 07/08/2023 6.5  5.0 - 8.0 Final  . Protein, Urinalysis 07/08/2023 Negative  Negative mg/dL Final  . Glucose, Urinalysis 07/08/2023  Negative  Negative mg/dL Final  . Ketones, Urinalysis 07/08/2023 1+ (!)  Negative mg/dL Final  . Blood, Urinalysis 07/08/2023 Negative  Negative Final  . Nitrite, Urinalysis 07/08/2023 Negative  Negative Final  . Leukocyte Esterase, Urinalysis 07/08/2023 Negative  Negative Final  . Bilirubin, Urinalysis 07/08/2023 Negative  Negative Final  . Urobilinogen, Urinalysis 07/08/2023 0.2  0.2 - 1.0 mg/dL Final  . WBC, UA 93/82/7974 <1  <=5 /hpf Final  . Red Blood Cells, Urinalysis 07/08/2023 1  <=3 /hpf Final  . Bacteria, Urinalysis 07/08/2023 0-5  0 - 5 /hpf Final  . Squamous Epithelial Cells, Urinaly* 07/08/2023 0  /hpf Final  . PSA (Prostate Specific Antigen), T* 07/08/2023 0.01 (L)  0.10 - 4.00 ng/mL Final  . Cholesterol, Total 07/08/2023 191  100 - 200 mg/dL Final  . Triglyceride 93/82/7974 96  35 - 199 mg/dL Final  . HDL (High Density Lipoprotein) Cho* 07/08/2023 57.8  29.0 - 71.0 mg/dL Final  . LDL Calculated 07/08/2023 885  0 - 130 mg/dL Final  . VLDL Cholesterol 07/08/2023 19  mg/dL Final  . Cholesterol/HDL Ratio 07/08/2023  3.3   Final  . Hemoglobin A1C 07/08/2023 5.2  4.2 - 5.6 % Final  . Average Blood Glucose (Calc) 07/08/2023 103  mg/dL Final  Appointment on 93/87/7974  Component Date Value Ref Range Status  . Platelet Count 07/03/2023 137 (L)  150 - 450 10^3/uL Final   No follow-ups on file.  Payor: BLUE CROSS BLUE SHIELD / Plan: [employer] FEDERAL EMPLOYEE PROGRAM / Product Type: PPO /   This note is partially written by Greig Pouch, scribe, in the presence of and acting as the scribe of Dr. Arthea Farrow.   This video encounter was conducted with the patient's (or proxy's) verbal consent via secure, interactive audio and video telecommunications while in clinic/office/hospital.  The patient (or proxy) was instructed to have this encounter in a suitably private space and to only have persons present to whom they give permission to participate. In addition, patient identity was confirmed by use of name plus an additional identifier.  This visit was coded based on medical decision making (MDM).  I have reviewed, edited and added to the note as needed to reflect my best personal medical judgment.    Dr. Arthea Farrow, MD [facility] [place], [place] Ph:  [phone] Fax:  [phone]

## 2023-10-02 ENCOUNTER — Encounter: Payer: Self-pay | Admitting: Neurosurgery

## 2023-10-02 ENCOUNTER — Ambulatory Visit: Admitting: Neurosurgery

## 2023-10-02 VITALS — BP 116/78 | Ht 75.0 in | Wt 195.0 lb

## 2023-10-02 DIAGNOSIS — M5416 Radiculopathy, lumbar region: Secondary | ICD-10-CM

## 2023-10-02 DIAGNOSIS — Z9889 Other specified postprocedural states: Secondary | ICD-10-CM

## 2023-10-02 DIAGNOSIS — Z09 Encounter for follow-up examination after completed treatment for conditions other than malignant neoplasm: Secondary | ICD-10-CM

## 2023-10-02 NOTE — Progress Notes (Signed)
   REFERRING PHYSICIAN:  Valora Agent, Md [address] [place],  [place] [address]  DOS: 07/16/23 left L4-5 microdiscectomy.   HISTORY OF PRESENT ILLNESS:  10/02/23 Charles Marks is a 68 y.o presenting today for 3 month follow up after lumbar microdiscectomy. He is doing well with minimal complaints.   08/28/23 Charles Marks is status post microdiscectomy. he is doing well and reports improvement of his pre-op symptoms. His strength is improving.   PHYSICAL EXAMINATION:  General: Patient is well developed, well nourished, calm, collected, and in no apparent distress.   NEUROLOGICAL:  General: In no acute distress.   Awake, alert, oriented to person, place, and time.  Pupils equal round and reactive to light.    Strength:             Side Iliopsoas Quads Hamstring PF DF EHL  R 5 5 5 5 5 5   L 5 5 5 5 4 4    Incision c/d/I and well healed    ROS (Neurologic):  Negative except as noted above  IMAGING: No interval imaging to review  ASSESSMENT/PLAN:  Charles Marks is doing well after discectomy. He is very pleased with his post-op recover. He can resume activities as tolerated. We will see him going forward on an as needed basis. He was encouraged to call the office with any questions or concerns. He expressed understanding and was in agreement with this plan.   Edsel Goods PA-C Department of neurosurgery

## 2024-01-27 ENCOUNTER — Emergency Department

## 2024-01-27 ENCOUNTER — Emergency Department
Admission: EM | Admit: 2024-01-27 | Discharge: 2024-01-27 | Disposition: A | Discharge status: Home / Self Care | Attending: Emergency Medicine | Admitting: Emergency Medicine

## 2024-01-27 ENCOUNTER — Encounter: Payer: Self-pay | Admitting: Emergency Medicine

## 2024-01-27 DIAGNOSIS — R42 Dizziness and giddiness: Secondary | ICD-10-CM | POA: Insufficient documentation

## 2024-01-27 DIAGNOSIS — E119 Type 2 diabetes mellitus without complications: Secondary | ICD-10-CM | POA: Diagnosis not present

## 2024-01-27 DIAGNOSIS — Y900 Blood alcohol level of less than 20 mg/100 ml: Secondary | ICD-10-CM | POA: Insufficient documentation

## 2024-01-27 LAB — CBC WITH DIFFERENTIAL/PLATELET
Abs Immature Granulocytes: 0.01 K/uL (ref 0.00–0.07)
Basophils Absolute: 0 K/uL (ref 0.0–0.1)
Basophils Relative: 0 %
Eosinophils Absolute: 0 K/uL (ref 0.0–0.5)
Eosinophils Relative: 0 %
HCT: 42.8 % (ref 39.0–52.0)
Hemoglobin: 14.7 g/dL (ref 13.0–17.0)
Immature Granulocytes: 0 %
Lymphocytes Relative: 17 %
Lymphs Abs: 0.8 K/uL (ref 0.7–4.0)
MCH: 32.9 pg (ref 26.0–34.0)
MCHC: 34.3 g/dL (ref 30.0–36.0)
MCV: 95.7 fL (ref 80.0–100.0)
Monocytes Absolute: 0.3 K/uL (ref 0.1–1.0)
Monocytes Relative: 6 %
Neutro Abs: 3.6 K/uL (ref 1.7–7.7)
Neutrophils Relative %: 77 %
Platelets: 147 K/uL — ABNORMAL LOW (ref 150–400)
RBC: 4.47 MIL/uL (ref 4.22–5.81)
RDW: 13.6 % (ref 11.5–15.5)
WBC: 4.7 K/uL (ref 4.0–10.5)
nRBC: 0 % (ref 0.0–0.2)

## 2024-01-27 LAB — APTT: aPTT: 31 s (ref 24–36)

## 2024-01-27 LAB — COMPREHENSIVE METABOLIC PANEL WITH GFR
ALT: 22 U/L (ref 0–44)
AST: 25 U/L (ref 15–41)
Albumin: 4.7 g/dL (ref 3.5–5.0)
Alkaline Phosphatase: 36 U/L — ABNORMAL LOW (ref 38–126)
Anion gap: 11 (ref 5–15)
BUN: 9 mg/dL (ref 8–23)
CO2: 27 mmol/L (ref 22–32)
Calcium: 9.4 mg/dL (ref 8.9–10.3)
Chloride: 102 mmol/L (ref 98–111)
Creatinine, Ser: 0.75 mg/dL (ref 0.61–1.24)
GFR, Estimated: >60 mL/min
Glucose, Bld: 126 mg/dL — ABNORMAL HIGH (ref 70–99)
Potassium: 4.5 mmol/L (ref 3.5–5.1)
Sodium: 140 mmol/L (ref 135–145)
Total Bilirubin: 0.8 mg/dL (ref 0.0–1.2)
Total Protein: 6.5 g/dL (ref 6.5–8.1)

## 2024-01-27 LAB — PROTIME-INR
INR: 1.1 (ref 0.8–1.2)
Prothrombin Time: 14.7 s (ref 11.4–15.2)

## 2024-01-27 LAB — ETHANOL: Alcohol, Ethyl (B): <15 mg/dL

## 2024-01-27 MED ORDER — MECLIZINE HCL 25 MG PO TABS: 25.0000 mg | ORAL_TABLET | Freq: Three times a day (TID) | ORAL | 0 refills | Status: AC | PRN

## 2024-01-27 MED ORDER — MECLIZINE HCL 25 MG PO TABS
50.0000 mg | ORAL_TABLET | Freq: Once | ORAL | Status: AC
  Administered 2024-01-27: 50 mg via ORAL
  Filled 2024-01-27: qty 2

## 2024-01-27 NOTE — ED Provider Notes (Signed)
 "  [facility] Provider Note   Event Date/Time   First MD Initiated Contact with Patient 01/27/24 [phone]     (approximate) History  Dizziness  HPI Charles Marks is a 69 y.o. 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  Patient states that this dizziness acutely worsens when he gets up from a seated position or when he shakes his head back-and-forth.  Patient denies any symptoms similar to this in the past.  Patient states that he can get the symptoms to resolve by sitting still and not moving. ROS: Patient currently denies any vision changes, tinnitus, difficulty speaking, facial droop, sore throat, chest pain, shortness of breath, abdominal pain, nausea/vomiting/diarrhea, dysuria, or weakness/numbness/paresthesias in any extremity   Physical Exam  Triage Vital Signs: ED Triage Vitals  Encounter Vitals Group     BP 01/27/24 1512 135/65     Girls Systolic BP Percentile --      Girls Diastolic BP Percentile --      Boys Systolic BP Percentile --      Boys Diastolic BP Percentile --      Pulse Rate 01/27/24 1512 67     Resp 01/27/24 1512 16     Temp 01/27/24 1512 98.3 F (36.8 C)     Temp Source 01/27/24 1512 Oral     SpO2 01/27/24 1512 98 %     Weight --      Height --      Head Circumference --      Peak Flow --      Pain Score 01/27/24 1511 0     Pain Loc --      Pain Education --      Exclude from Growth Chart --    Most recent vital signs: Vitals:   01/27/24 1512 01/27/24 1849  BP: 135/65 128/75  Pulse: 67 70  Resp: 16 18  Temp: 98.3 F (36.8 C) 98.3 F (36.8 C)  SpO2: 98% 98%   General: Awake, oriented x4. CV:  Good peripheral perfusion. Resp:  Normal effort. Abd:  No distention. Other:  Middle-aged well-developed,  well-nourished Caucasian male resting comfortably in no acute distress.  NIHSS 0 ED Results / Procedures / Treatments  Labs (all labs ordered are listed, but only abnormal results are displayed) Labs Reviewed  COMPREHENSIVE METABOLIC PANEL WITH GFR - Abnormal; Notable for the following components:      Result Value   Glucose, Bld 126 (*)    Alkaline Phosphatase 36 (*)    All other components within normal limits  CBC WITH DIFFERENTIAL/PLATELET - Abnormal; Notable for the following components:   Platelets 147 (*)    All other components within normal limits  PROTIME-INR  APTT  ETHANOL  CBG MONITORING, ED   EKG ED ECG REPORT I, Artist MARLA Kerns, the attending physician, personally viewed and interpreted this ECG. Date: 01/27/2024 EKG Time: 1514 Rate: 64 Rhythm: normal sinus rhythm QRS Axis: normal Intervals: normal ST/T Wave abnormalities: normal Narrative Interpretation: no evidence of acute ischemia RADIOLOGY ED MD interpretation: CT of the head without contrast interpreted by me shows no evidence of acute abnormalities including no intracerebral hemorrhage, obvious masses, or significant edema - All radiology independently interpreted and agree with radiology assessment Official radiology report(s): CT HEAD WO CONTRAST Result Date: 01/27/2024 CLINICAL  DATA:  Vertigo.  Unsteady when walking today. EXAM: CT HEAD WITHOUT CONTRAST TECHNIQUE: Contiguous axial images were obtained from the base of the skull through the vertex without intravenous contrast. RADIATION DOSE REDUCTION: This exam was performed according to the departmental dose-optimization program which includes automated exposure control, adjustment of the mA and/or kV according to patient size and/or use of iterative reconstruction technique. COMPARISON:  07/29/2020 and 08/30/2020 FINDINGS: Brain: Ventricles, cisterns and other CSF spaces are normal. Mild chronic ischemic microvascular disease is present. Mild encephalomalacia  over the right frontal lobe. Couple coarse calcifications adjacent the lateral ventricles. There is very subtle thin hypodensity along the inner table patient's right frontal parietal craniotomy site likely residual chronic changes from patient's previous right convexity subdural hematoma evacuation. No definite acute intracranial hemorrhage. No mass or mass effect. No midline shift. Vascular: No hyperdense vessel or unexpected calcification. Skull: Postsurgical change compatible prior right frontoparietal craniotomy. Sinuses/Orbits: No acute finding. Other: None. IMPRESSION: 1. No acute findings. 2. Mild chronic ischemic microvascular disease. Mild encephalomalacia over the right frontal lobe. 3. Postsurgical change compatible with prior right frontoparietal craniotomy. Very subtle thin hypodensity along the inner table of the patient's right frontal parietal craniotomy site likely residual chronic changes from patient's previous right convexity subdural hematoma evacuation. Electronically Signed   By: Toribio Agreste M.D.   On: 01/27/2024 16:18   PROCEDURES: Critical Care performed: No Procedures MEDICATIONS ORDERED IN ED: Medications  meclizine  (ANTIVERT ) tablet 50 mg (50 mg Oral Given 01/27/24 1728)   IMPRESSION / MDM / ASSESSMENT AND PLAN / ED COURSE  I reviewed the triage vital signs and the nursing notes.                             The patient is on the cardiac monitor to evaluate for evidence of arrhythmia and/or significant heart rate changes. Patient's presentation is most consistent with acute presentation with potential threat to life or bodily function. Patient 69 year old male with the above-stated past medical history presents complaining of positional sensation that the room is spinning around him consistent with vertigo Based on History, Exam, and Findings, presentation not consistent with syncope, seizure, stroke, meningitis, symptomatic anemia (gastrointestinal bleed), Increased ICP  (cerebral tumor/mass), ICH. Additionally, I have a low suspicion for AOM, labyrinthitis, or other infectious process. Tx: meclizine  Reassessment: Prior to discharge symptoms controlled, patient well appearing. Disposition:  Discharge. Strict return precautions discussed w/ full understanding. Advise follow up with primary care provider within 24-48 hours.   FINAL CLINICAL IMPRESSION(S) / ED DIAGNOSES   Final diagnoses:  Vertigo   Rx / DC Orders   ED Discharge Orders          Ordered    meclizine  (ANTIVERT ) 25 MG tablet  3 times daily PRN        01/27/24 1840           Note:  This document was prepared using Dragon voice recognition software and may include unintentional dictation errors.   Jossie Artist POUR, MD 01/27/24 1851  "

## 2024-01-27 NOTE — ED Triage Notes (Signed)
 Pt reports waking up around 7 am and was unsteady when walking. Pt went to bed at 830 pm. Denies headache. Dizziness worse with movement and when shaking head back and forth. No weakness, facial droop or vision changes.
